# Patient Record
Sex: Female | Born: 1940
Health system: Southern US, Community
[De-identification: ages and names within clinical notes are randomized; demographics above are authoritative.]

## PROBLEM LIST (undated history)

## (undated) DIAGNOSIS — Z5189 Encounter for other specified aftercare: Secondary | ICD-10-CM

## (undated) DIAGNOSIS — F329 Major depressive disorder, single episode, unspecified: Secondary | ICD-10-CM

## (undated) DIAGNOSIS — E785 Hyperlipidemia, unspecified: Secondary | ICD-10-CM

## (undated) DIAGNOSIS — E538 Deficiency of other specified B group vitamins: Secondary | ICD-10-CM

## (undated) DIAGNOSIS — F32A Depression, unspecified: Secondary | ICD-10-CM

## (undated) DIAGNOSIS — F419 Anxiety disorder, unspecified: Secondary | ICD-10-CM

## (undated) DIAGNOSIS — E119 Type 2 diabetes mellitus without complications: Secondary | ICD-10-CM

## (undated) DIAGNOSIS — I10 Essential (primary) hypertension: Secondary | ICD-10-CM

## (undated) DIAGNOSIS — K644 Residual hemorrhoidal skin tags: Secondary | ICD-10-CM

## (undated) DIAGNOSIS — K2970 Gastritis, unspecified, without bleeding: Secondary | ICD-10-CM

## (undated) DIAGNOSIS — K317 Polyp of stomach and duodenum: Secondary | ICD-10-CM

## (undated) DIAGNOSIS — N179 Acute kidney failure, unspecified: Secondary | ICD-10-CM

## (undated) DIAGNOSIS — IMO0001 Reserved for inherently not codable concepts without codable children: Secondary | ICD-10-CM

## (undated) DIAGNOSIS — D510 Vitamin B12 deficiency anemia due to intrinsic factor deficiency: Secondary | ICD-10-CM

## (undated) DIAGNOSIS — R0602 Shortness of breath: Secondary | ICD-10-CM

## (undated) DIAGNOSIS — I4891 Unspecified atrial fibrillation: Secondary | ICD-10-CM

## (undated) DIAGNOSIS — K624 Stenosis of anus and rectum: Secondary | ICD-10-CM

## (undated) DIAGNOSIS — K3189 Other diseases of stomach and duodenum: Secondary | ICD-10-CM

## (undated) HISTORY — PX: ABDOMINAL HYSTERECTOMY: SHX81

## (undated) HISTORY — DX: Major depressive disorder, single episode, unspecified: F32.9

## (undated) HISTORY — DX: Vitamin B12 deficiency anemia due to intrinsic factor deficiency: D51.0

## (undated) HISTORY — DX: Anxiety disorder, unspecified: F41.9

## (undated) HISTORY — DX: Polyp of stomach and duodenum: K31.7

## (undated) HISTORY — DX: Stenosis of anus and rectum: K62.4

## (undated) HISTORY — PX: CARDIAC ELECTROPHYSIOLOGY STUDY AND ABLATION: SHX1294

## (undated) HISTORY — DX: Depression, unspecified: F32.A

## (undated) HISTORY — DX: Acute kidney failure, unspecified: N17.9

## (undated) HISTORY — DX: Gastritis, unspecified, without bleeding: K29.70

## (undated) HISTORY — DX: Residual hemorrhoidal skin tags: K64.4

## (undated) HISTORY — DX: Essential (primary) hypertension: I10

## (undated) HISTORY — DX: Other diseases of stomach and duodenum: K31.89

## (undated) HISTORY — DX: Deficiency of other specified B group vitamins: E53.8

---

## 1978-09-23 HISTORY — PX: BACK SURGERY: SHX140

## 2008-09-23 DIAGNOSIS — K624 Stenosis of anus and rectum: Secondary | ICD-10-CM

## 2008-09-23 HISTORY — DX: Stenosis of anus and rectum: K62.4

## 2011-05-15 ENCOUNTER — Other Ambulatory Visit: Payer: Self-pay | Admitting: Internal Medicine

## 2011-05-15 DIAGNOSIS — Z1231 Encounter for screening mammogram for malignant neoplasm of breast: Secondary | ICD-10-CM

## 2011-05-15 DIAGNOSIS — Z780 Asymptomatic menopausal state: Secondary | ICD-10-CM

## 2011-09-24 DIAGNOSIS — K31A0 Gastric intestinal metaplasia, unspecified: Secondary | ICD-10-CM

## 2011-09-24 HISTORY — DX: Gastric intestinal metaplasia, unspecified: K31.A0

## 2011-09-28 ENCOUNTER — Emergency Department (INDEPENDENT_AMBULATORY_CARE_PROVIDER_SITE_OTHER): Payer: Medicare Other

## 2011-09-28 ENCOUNTER — Encounter: Payer: Self-pay | Admitting: Emergency Medicine

## 2011-09-28 ENCOUNTER — Other Ambulatory Visit: Payer: Self-pay

## 2011-09-28 ENCOUNTER — Inpatient Hospital Stay (HOSPITAL_BASED_OUTPATIENT_CLINIC_OR_DEPARTMENT_OTHER)
Admission: EM | Admit: 2011-09-28 | Discharge: 2011-10-02 | DRG: 812 | Disposition: A | Discharge status: Home / Self Care | Payer: Medicare Other | Source: Ambulatory Visit | Attending: Family Medicine | Admitting: Family Medicine

## 2011-09-28 DIAGNOSIS — R42 Dizziness and giddiness: Secondary | ICD-10-CM | POA: Diagnosis present

## 2011-09-28 DIAGNOSIS — D649 Anemia, unspecified: Secondary | ICD-10-CM | POA: Diagnosis present

## 2011-09-28 DIAGNOSIS — N179 Acute kidney failure, unspecified: Secondary | ICD-10-CM | POA: Diagnosis present

## 2011-09-28 DIAGNOSIS — K922 Gastrointestinal hemorrhage, unspecified: Secondary | ICD-10-CM | POA: Diagnosis present

## 2011-09-28 DIAGNOSIS — K2940 Chronic atrophic gastritis without bleeding: Secondary | ICD-10-CM | POA: Diagnosis present

## 2011-09-28 DIAGNOSIS — R55 Syncope and collapse: Secondary | ICD-10-CM

## 2011-09-28 DIAGNOSIS — K317 Polyp of stomach and duodenum: Secondary | ICD-10-CM | POA: Diagnosis present

## 2011-09-28 DIAGNOSIS — R5381 Other malaise: Secondary | ICD-10-CM

## 2011-09-28 DIAGNOSIS — D519 Vitamin B12 deficiency anemia, unspecified: Secondary | ICD-10-CM

## 2011-09-28 DIAGNOSIS — D518 Other vitamin B12 deficiency anemias: Secondary | ICD-10-CM | POA: Diagnosis present

## 2011-09-28 DIAGNOSIS — E860 Dehydration: Secondary | ICD-10-CM | POA: Diagnosis present

## 2011-09-28 DIAGNOSIS — N390 Urinary tract infection, site not specified: Secondary | ICD-10-CM | POA: Diagnosis present

## 2011-09-28 DIAGNOSIS — D7589 Other specified diseases of blood and blood-forming organs: Secondary | ICD-10-CM | POA: Diagnosis present

## 2011-09-28 DIAGNOSIS — D131 Benign neoplasm of stomach: Secondary | ICD-10-CM | POA: Diagnosis present

## 2011-09-28 DIAGNOSIS — R634 Abnormal weight loss: Secondary | ICD-10-CM

## 2011-09-28 DIAGNOSIS — D510 Vitamin B12 deficiency anemia due to intrinsic factor deficiency: Principal | ICD-10-CM

## 2011-09-28 DIAGNOSIS — R5383 Other fatigue: Secondary | ICD-10-CM

## 2011-09-28 DIAGNOSIS — F341 Dysthymic disorder: Secondary | ICD-10-CM | POA: Diagnosis present

## 2011-09-28 DIAGNOSIS — R51 Headache: Secondary | ICD-10-CM

## 2011-09-28 DIAGNOSIS — Z23 Encounter for immunization: Secondary | ICD-10-CM

## 2011-09-28 HISTORY — DX: Encounter for other specified aftercare: Z51.89

## 2011-09-28 HISTORY — DX: Shortness of breath: R06.02

## 2011-09-28 HISTORY — DX: Reserved for inherently not codable concepts without codable children: IMO0001

## 2011-09-28 LAB — URINE MICROSCOPIC-ADD ON

## 2011-09-28 LAB — COMPREHENSIVE METABOLIC PANEL
ALT: 16 U/L (ref 0–35)
AST: 17 U/L (ref 0–37)
Albumin: 4 g/dL (ref 3.5–5.2)
Alkaline Phosphatase: 66 U/L (ref 39–117)
BUN: 31 mg/dL — ABNORMAL HIGH (ref 6–23)
CO2: 24 mEq/L (ref 19–32)
Calcium: 9.2 mg/dL (ref 8.4–10.5)
Chloride: 106 mEq/L (ref 96–112)
Creatinine, Ser: 1.5 mg/dL — ABNORMAL HIGH (ref 0.50–1.10)
GFR calc Af Amer: 39 mL/min — ABNORMAL LOW (ref >90)
GFR calc non Af Amer: 34 mL/min — ABNORMAL LOW (ref >90)
Glucose, Bld: 114 mg/dL — ABNORMAL HIGH (ref 70–99)
Potassium: 3.8 mEq/L (ref 3.5–5.1)
Sodium: 140 mEq/L (ref 135–145)
Total Bilirubin: 1.1 mg/dL (ref 0.3–1.2)
Total Protein: 7 g/dL (ref 6.0–8.3)

## 2011-09-28 LAB — CBC
HCT: 18.7 % — ABNORMAL LOW (ref 36.0–46.0)
HCT: 17.4 % — ABNORMAL LOW (ref 36.0–46.0)
Hemoglobin: 6.5 g/dL — CL (ref 12.0–15.0)
Hemoglobin: 6.3 g/dL — CL (ref 12.0–15.0)
MCH: 39.9 pg — ABNORMAL HIGH (ref 26.0–34.0)
MCH: 41.2 pg — ABNORMAL HIGH (ref 26.0–34.0)
MCHC: 34.8 g/dL (ref 30.0–36.0)
MCHC: 36.2 g/dL — ABNORMAL HIGH (ref 30.0–36.0)
MCV: 114.7 fL — ABNORMAL HIGH (ref 78.0–100.0)
MCV: 113.7 fL — ABNORMAL HIGH (ref 78.0–100.0)
Platelets: 159 10*3/uL (ref 150–400)
Platelets: 154 10*3/uL (ref 150–400)
RBC: 1.63 MIL/uL — ABNORMAL LOW (ref 3.87–5.11)
RBC: 1.53 MIL/uL — ABNORMAL LOW (ref 3.87–5.11)
RDW: 16.9 % — ABNORMAL HIGH (ref 11.5–15.5)
RDW: 18.5 % — ABNORMAL HIGH (ref 11.5–15.5)
WBC: 4.6 10*3/uL (ref 4.0–10.5)
WBC: 4.8 10*3/uL (ref 4.0–10.5)

## 2011-09-28 LAB — URINALYSIS, ROUTINE W REFLEX MICROSCOPIC
Bilirubin Urine: NEGATIVE
Glucose, UA: NEGATIVE mg/dL
Ketones, ur: NEGATIVE mg/dL
Nitrite: POSITIVE — AB
Protein, ur: NEGATIVE mg/dL
Specific Gravity, Urine: 1.018 (ref 1.005–1.030)
Urobilinogen, UA: 1 mg/dL (ref 0.0–1.0)
pH: 5.5 (ref 5.0–8.0)

## 2011-09-28 LAB — APTT: aPTT: 32 seconds (ref 24–37)

## 2011-09-28 LAB — RETICULOCYTES
RBC.: 1.53 MIL/uL — ABNORMAL LOW (ref 3.87–5.11)
Retic Count, Absolute: 30.6 10*3/uL (ref 19.0–186.0)
Retic Ct Pct: 2 % (ref 0.4–3.1)

## 2011-09-28 LAB — PROTIME-INR
INR: 1.05 (ref 0.00–1.49)
Prothrombin Time: 13.9 seconds (ref 11.6–15.2)

## 2011-09-28 LAB — CARDIAC PANEL(CRET KIN+CKTOT+MB+TROPI)
CK, MB: 1.2 ng/mL (ref 0.3–4.0)
Relative Index: INVALID (ref 0.0–2.5)
Total CK: 19 U/L (ref 7–177)
Troponin I: <0.3 ng/mL (ref <0.30)

## 2011-09-28 LAB — OCCULT BLOOD X 1 CARD TO LAB, STOOL: Fecal Occult Bld: NEGATIVE

## 2011-09-28 MED ORDER — SODIUM CHLORIDE 0.45 % IV SOLN: INTRAVENOUS | Status: DC

## 2011-09-28 MED ORDER — PANTOPRAZOLE SODIUM 40 MG IV SOLR
40.0000 mg | Freq: Once | INTRAVENOUS | Status: AC
  Administered 2011-09-28: 40 mg via INTRAVENOUS
  Filled 2011-09-28: qty 40

## 2011-09-28 MED ORDER — SODIUM CHLORIDE 0.9 % IV SOLN
Freq: Once | INTRAVENOUS | Status: AC
  Administered 2011-09-30: 01:00:00 via INTRAVENOUS

## 2011-09-28 MED ORDER — SODIUM CHLORIDE 0.9 % IV SOLN: INTRAVENOUS | Status: AC

## 2011-09-28 MED ORDER — SODIUM CHLORIDE 0.9 % IV BOLUS (SEPSIS)
1000.0000 mL | Freq: Once | INTRAVENOUS | Status: AC
  Administered 2011-09-28: 1000 mL via INTRAVENOUS

## 2011-09-28 MED ORDER — SODIUM CHLORIDE 0.9 % IV SOLN
Freq: Once | INTRAVENOUS | Status: AC
  Administered 2011-09-28: 16:00:00 via INTRAVENOUS

## 2011-09-28 NOTE — ED Notes (Signed)
Room assignment 4731-2 given to pt and family

## 2011-09-28 NOTE — ED Notes (Signed)
Discussed with EDP Tucich- pt NOT to be transfused at this time

## 2011-09-28 NOTE — ED Notes (Signed)
EDP Tucich notified of hbg 6.5

## 2011-09-28 NOTE — H&P (Signed)
PCP:  Currently none  Chief Complaint:  General fatigue and lighheaded  HPI: Kathryn Thomas is a 71 y.o. female   has no past medical history on file.   Presented with  Feeling generally weak, lightheaded when ever shestands up. Reports poor appetite for past few months.  Loss of weight for past 3 months unsure how much. NO blood in stool, no melena, denies nausea or vomiting. No epigastric pain. Takes motrin on occasion (twice a week). Recently have felt so poorly she has hardly gotten out of the bed. Over past few months noticed her hair getting thinner.   She have had negative colonoscopy 2 years ago and have had regular mammograms.   Review of Systems:    Pertinent positives include: Knee pain, weight loss, fatigue last month had some leg swelling but not anymore, loss of appetite   Constitutional:  No  night sweats, Fevers, chills, HEENT:  No headaches, Difficulty swallowing,Tooth/dental problems,Sore throat,  No sneezing, itching, ear ache, nasal congestion, post nasal drip,  Cardio-vascular:  No chest pain, Orthopnea, PND, anasarca, dizziness, palpitations.no Bilateral lower extremity swelling  GI:  No heartburn, indigestion, abdominal pain, nausea, vomiting, diarrhea, change in bowel habits Resp:  no shortness of breath at rest. No dyspnea on exertion, No excess mucus, no productive cough, No non-productive cough, No coughing up of blood.No change in color of mucus.No wheezing.No chest wall deformity  Skin:  no rash or lesions.  GU:  no dysuria, change in color of urine, no urgency or frequency. No flank pain.  Musculoskeletal:  No joint pain or swelling. No decreased range of motion. No back pain.  Psych:  No change in mood or affect. No depression or anxiety. No memory loss.   Otherwise ROS are negative except for above, 10 systems were reviewed  Past Medical History: History reviewed. No pertinent past medical history. Past Surgical History  Procedure Date  .  Back surgery   . Abdominal hysterectomy      Medications: Prior to Admission medications   Not on File    Allergies:  No Known Allergies  Social History:  Ambulatory independently Lives at home alone   reports that she has never smoked. She has never used smokeless tobacco. She reports that she does not drink alcohol or use illicit drugs.   Family History: family history includes Cancer in her mother; Heart disease in her father; and Multiple myeloma in her sister.    Physical Exam: Patient Vitals for the past 24 hrs:  BP Temp Temp src Pulse Resp SpO2 Height Weight  09/28/11 2017 107/41 mmHg 99.1 F (37.3 C) Oral 58  18  99 % 5\' 7"  (1.702 m) 95.23 kg (209 lb 15.1 oz)  09/28/11 1923 109/54 mmHg - - 70  18  100 % - -  09/28/11 1800 98/48 mmHg 97.3 F (36.3 C) Oral - 18  100 % - -  09/28/11 1736 98/42 mmHg - - - 16  100 % - -  09/28/11 1548 93/58 mmHg - - - 18  98 % - -  09/28/11 1356 91/47 mmHg 97.3 F (36.3 C) Oral - 16  100 % - -  09/28/11 1327 94/52 mmHg - - - - 95 % - -  09/28/11 1304 112/57 mmHg 98.4 F (36.9 C) Oral 73  20  100 % 5\' 7"  (1.702 m) 95.255 kg (210 lb)    1. General:  in No Acute distress 2. Psychological: Alert and Oriented 3. Head/ENT:  Dry Mucous Membranes                          Head Non traumatic, neck supple                          Normal  Dentition 4. SKIN:  decreased Skin turgor,  Skin clean Dry and intact no rash, loose skin consistent with rapid weight loss. 5. Heart: Regular rate and rhythm no Murmur, Rub or gallop 6. Lungs: Clear to auscultation bilaterally, no wheezes or crackles   7. Abdomen: Soft, non-tender, Non distended 8. Lower extremities: no clubbing, cyanosis, or edema 9. Neurologically Grossly intact, moving all 4 extremities equally 10. MSK: Normal range of motion  body mass index is 32.88 kg/(m^2).   Labs on Admission:   Basename 09/28/11 1400  NA 140  K 3.8  CL 106  CO2 24  GLUCOSE 114*  BUN 31*    CREATININE 1.50*  CALCIUM 9.2  MG --  PHOS --    Basename 09/28/11 1400  AST 17  ALT 16  ALKPHOS 66  BILITOT 1.1  PROT 7.0  ALBUMIN 4.0   No results found for this basename: LIPASE:2,AMYLASE:2 in the last 72 hours  Basename 09/28/11 2209 09/28/11 1400  WBC 4.8 4.6  NEUTROABS -- --  HGB 6.3* 6.5*  HCT 17.4* 18.7*  MCV 113.7* 114.7*  PLT 154 159    Basename 09/28/11 1400  CKTOTAL 19  CKMB 1.2  CKMBINDEX --  TROPONINI <0.30   No results found for this basename: TSH,T4TOTAL,FREET3,T3FREE,THYROIDAB in the last 72 hours No results found for this basename: VITAMINB12:2,FOLATE:2,FERRITIN:2,TIBC:2,IRON:2,RETICCTPCT:2 in the last 72 hours No results found for this basename: HGBA1C    Estimated Creatinine Clearance: 40.7 ml/min (by C-G formula based on Cr of 1.5).   Other results:  I have pearsonaly reviewed this: ECG REPORT  Rate:71  Rhythm: NSR ST&T Change: NO ischemic changes  UA WBC 21-50 many bacteria Fecal Occult Bld NEGATIVE  INR 1.05   Cultures: No results found for this basename: sdes, specrequest, cult, reptstatus       Radiological Exams on Admission: Dg Chest 2 View  09/28/2011  *RADIOLOGY REPORT*  Clinical Data: Weakness, dizziness  CHEST - 2 VIEW  Comparison: None.  Findings: Lungs are clear. No pleural effusion or pneumothorax.  The heart is top normal in size.  Degenerative changes of the visualized thoracolumbar spine.  IMPRESSION: No evidence of acute cardiopulmonary disease.  Original Report Authenticated By: Charline Bills, M.D.   Ct Head Wo Contrast  09/28/2011  *RADIOLOGY REPORT*  Clinical Data: Headaches, weakness, dizziness  CT HEAD WITHOUT CONTRAST  Technique:  Contiguous axial images were obtained from the base of the skull through the vertex without contrast.  Comparison: None.  Findings: No evidence of parenchymal hemorrhage or extra-axial fluid collection. No mass lesion, mass effect, or midline shift.  No CT evidence of acute  infarction.  Mild subcortical white matter and periventricular small vessel ischemic changes.  Intracranial atherosclerosis.  Cerebral volume is age appropriate.  No ventriculomegaly.  The visualized paranasal sinuses are essentially clear. The mastoid air cells are unopacified.  No evidence of calvarial fracture.  IMPRESSION: No evidence of acute intracranial abnormality.  Mild small vessel ischemic changes with intracranial atherosclerosis.  Original Report Authenticated By: Charline Bills, M.D.    Assessment/Plan  Present on Admission:  .Anemia - etiology unclear, low retic count is worrisome. Will transfuse 2  units. Prior to transfusion will obtain anemia panel, hemoccult stool, if iron deficiency noted would benefit from Gi work up. Will hold off on ibuprofen and write for protonix po for now. Also check TSH, CBC w diff and blood smear. May need hematology consult if no clear blood loss..Weight loss - Worrisome in association with anemia. She would benefit from cancer work up. CXR is negative. Consider CT of the abdomen if work up is otherwise inconclusive.  Marland KitchenPositional lightheadedness - Patient appears dehydrated with elevated Cr. Will give IVF and check orthostatics.  Monitor on Telemetry, CE x1 wnl, ECG no evidence of ischemia she has been symptomatic for weeks .Acute renal failure - - likely secondary to dehydration, check FeNA and if not improved with IVF would obtain renal US  .UTI (lower urinary tract infection) - obtain urine culture and start on rocephin   Prophylaxis: SCD , Protonix  CODE STATUS: FULL CODE    Geanette Buonocore 09/28/2011, 11:06 PM

## 2011-09-28 NOTE — ED Provider Notes (Signed)
History     CSN: [id number]  Arrival date & time 09/28/11  1152   First MD Initiated Contact with Patient 09/28/11 1316      Chief Complaint  Patient presents with  . Dizziness  . Anorexia   Poor appetite, and dizziness for 6 months. Worsening over the past 2 months. Also, complains of nausea, and weakness, and some vertiginous symptoms.  In by HER-2 daughters, who states that mom, essentially has not gotten out of bed of the past week. She is normally active. She's had some intermittent headaches, and nausea. She denies any melena or abdominal pain, but states that every time. She gets up she feels very dizzy and lightheaded. She's had no chest pain. No fevers no vomiting. Denies any new medications or recent travel. Apparently, does have a sibling with some type of bone marrow cancer.  The patient did see her primary doctor approximately 2-3 months ago and have basic lab work done, which was reported to her as being normal.  Patient does not smoke or drink. Takes ibuprofen on occasion. No aspirin or Plavix or any blood thinners. Patient was unaware of any dark or tarry stools. (Consider location/radiation/quality/duration/timing/severity/associated sxs/prior treatment) HPI  History reviewed. No pertinent past medical history.  Past Surgical History  Procedure Date  . Back surgery   . Abdominal hysterectomy     History reviewed. No pertinent family history.  History  Substance Use Topics  . Smoking status: Never Smoker   . Smokeless tobacco: Never Used  . Alcohol Use: No    OB History    Grav Para Term Preterm Abortions TAB SAB Ect Mult Living                  Review of Systems  All other systems reviewed and are negative.    Allergies  Review of patient's allergies indicates no known allergies.  Home Medications  No current outpatient prescriptions on file.  BP 91/47  Pulse 73  Temp(Src) 97.3 F (36.3 C) (Oral)  Resp 16  Ht 5\' 7"  (1.702 m)  Wt 210 lb (95.255  kg)  BMI 32.89 kg/m2  SpO2 100%  Physical Exam  Nursing note and vitals reviewed. Constitutional: She is oriented to person, place, and time. She appears well-developed and well-nourished.       No distress, somewhat listless, no lethargy.  HENT:  Head: Normocephalic and atraumatic.  Eyes: Conjunctivae and EOM are normal. Pupils are equal, round, and reactive to light.  Neck: Neck supple.  Cardiovascular: Normal rate and regular rhythm.  Exam reveals no gallop and no friction rub.   No murmur heard. Pulmonary/Chest: Breath sounds normal. She has no wheezes. She has no rales. She exhibits no tenderness.  Abdominal: Soft. Bowel sounds are normal. She exhibits no distension. There is no tenderness. There is no rebound and no guarding.  Genitourinary:       Rectal done with female nursing supervision, nl tone, no gross blood, dark, not tarry stool  Musculoskeletal: Normal range of motion. She exhibits no edema and no tenderness.  Neurological: She is alert and oriented to person, place, and time. She has normal reflexes. No cranial nerve deficit. She exhibits normal muscle tone. Coordination normal.  Skin: Skin is warm and dry. No rash noted.  Psychiatric: She has a normal mood and affect.    ED Course  Procedures (including critical care time)  Labs Reviewed  CBC - Abnormal; Notable for the following:    RBC 1.63 (*)  Hemoglobin 6.5 (*)    HCT 18.7 (*)    MCV 114.7 (*)    MCH 39.9 (*)    RDW 16.9 (*)    All other components within normal limits  COMPREHENSIVE METABOLIC PANEL - Abnormal; Notable for the following:    Glucose, Bld 114 (*)    BUN 31 (*)    Creatinine, Ser 1.50 (*)    GFR calc non Af Amer 34 (*)    GFR calc Af Amer 39 (*)    All other components within normal limits  CARDIAC PANEL(CRET KIN+CKTOT+MB+TROPI)  APTT  PROTIME-INR  URINALYSIS, ROUTINE W REFLEX MICROSCOPIC   Dg Chest 2 View  09/28/2011  *RADIOLOGY REPORT*  Clinical Data: Weakness, dizziness  CHEST  - 2 VIEW  Comparison: None.  Findings: Lungs are clear. No pleural effusion or pneumothorax.  The heart is top normal in size.  Degenerative changes of the visualized thoracolumbar spine.  IMPRESSION: No evidence of acute cardiopulmonary disease.  Original Report Authenticated By: Charline Bills, M.D.   Ct Head Wo Contrast  09/28/2011  *RADIOLOGY REPORT*  Clinical Data: Headaches, weakness, dizziness  CT HEAD WITHOUT CONTRAST  Technique:  Contiguous axial images were obtained from the base of the skull through the vertex without contrast.  Comparison: None.  Findings: No evidence of parenchymal hemorrhage or extra-axial fluid collection. No mass lesion, mass effect, or midline shift.  No CT evidence of acute infarction.  Mild subcortical white matter and periventricular small vessel ischemic changes.  Intracranial atherosclerosis.  Cerebral volume is age appropriate.  No ventriculomegaly.  The visualized paranasal sinuses are essentially clear. The mastoid air cells are unopacified.  No evidence of calvarial fracture.  IMPRESSION: No evidence of acute intracranial abnormality.  Mild small vessel ischemic changes with intracranial atherosclerosis.  Original Report Authenticated By: Charline Bills, M.D.     1. Anemia   2. Near syncope       MDM  Pt is seen and examined;  Initial history and physical completed.  Will follow.      2:59 PM  Results for orders placed during the hospital encounter of 09/28/11  CBC      Component Value Range   WBC 4.6  4.0 - 10.5 (K/uL)   RBC 1.63 (*) 3.87 - 5.11 (MIL/uL)   Hemoglobin 6.5 (*) 12.0 - 15.0 (g/dL)   HCT 40.9 (*) 81.1 - 46.0 (%)   MCV 114.7 (*) 78.0 - 100.0 (fL)   MCH 39.9 (*) 26.0 - 34.0 (pg)   MCHC 34.8  30.0 - 36.0 (g/dL)   RDW 91.4 (*) 78.2 - 15.5 (%)   Platelets 159  150 - 400 (K/uL)  CARDIAC PANEL(CRET KIN+CKTOT+MB+TROPI)      Component Value Range   Total CK 19  7 - 177 (U/L)   CK, MB 1.2  0.3 - 4.0 (ng/mL)   Troponin I <0.30   <0.30 (ng/mL)   Relative Index RELATIVE INDEX IS INVALID  0.0 - 2.5   COMPREHENSIVE METABOLIC PANEL      Component Value Range   Sodium 140  135 - 145 (mEq/L)   Potassium 3.8  3.5 - 5.1 (mEq/L)   Chloride 106  96 - 112 (mEq/L)   CO2 24  19 - 32 (mEq/L)   Glucose, Bld 114 (*) 70 - 99 (mg/dL)   BUN 31 (*) 6 - 23 (mg/dL)   Creatinine, Ser 9.56 (*) 0.50 - 1.10 (mg/dL)   Calcium 9.2  8.4 - 21.3 (mg/dL)   Total Protein 7.0  6.0 - 8.3 (g/dL)   Albumin 4.0  3.5 - 5.2 (g/dL)   AST 17  0 - 37 (U/L)   ALT 16  0 - 35 (U/L)   Alkaline Phosphatase 66  39 - 117 (U/L)   Total Bilirubin 1.1  0.3 - 1.2 (mg/dL)   GFR calc non Af Amer 34 (*) >90 (mL/min)   GFR calc Af Amer 39 (*) >90 (mL/min)  APTT      Component Value Range   aPTT 32  24 - 37 (seconds)  PROTIME-INR      Component Value Range   Prothrombin Time 13.9  11.6 - 15.2 (seconds)   INR 1.05  0.00 - 1.49    No results found.   Date: 09/28/2011  Rate: 71  Rhythm: normal sinus rhythm  QRS Axis: normal  Intervals: normal  ST/T Wave abnormalities: nonspecific T wave changes  Conduction Disutrbances:none  Narrative Interpretation:   Old EKG Reviewed: none available        Larry Knipp A. Patrica Duel, MD 09/29/11 0700

## 2011-09-28 NOTE — ED Notes (Signed)
Phone report given to [facility]- advise they are 45 mins out

## 2011-09-28 NOTE — ED Notes (Signed)
Pt states she poor appetite and dizziness for 6 months but worsening over last two months.   Some diaphoresis, nausea, weakness and room spinning with ambulation.  Symptoms resolve with lying down.  No chest pain or SOB.  No V/D.

## 2011-09-29 DIAGNOSIS — D649 Anemia, unspecified: Secondary | ICD-10-CM

## 2011-09-29 LAB — DIFFERENTIAL
Basophils Absolute: 0 10*3/uL (ref 0.0–0.1)
Basophils Absolute: 0 10*3/uL (ref 0.0–0.1)
Basophils Relative: 0 % (ref 0–1)
Basophils Relative: 0 % (ref 0–1)
Eosinophils Absolute: 0.1 10*3/uL (ref 0.0–0.7)
Eosinophils Absolute: 0.2 10*3/uL (ref 0.0–0.7)
Eosinophils Relative: 3 % (ref 0–5)
Eosinophils Relative: 4 % (ref 0–5)
Lymphocytes Relative: 36 % (ref 12–46)
Lymphocytes Relative: 45 % (ref 12–46)
Lymphs Abs: 1.7 10*3/uL (ref 0.7–4.0)
Lymphs Abs: 2.3 10*3/uL (ref 0.7–4.0)
Monocytes Absolute: 0.1 10*3/uL (ref 0.1–1.0)
Monocytes Absolute: 0.1 10*3/uL (ref 0.1–1.0)
Monocytes Relative: 2 % — ABNORMAL LOW (ref 3–12)
Monocytes Relative: 3 % (ref 3–12)
Neutro Abs: 2.9 10*3/uL (ref 1.7–7.7)
Neutro Abs: 2.5 10*3/uL (ref 1.7–7.7)
Neutrophils Relative %: 59 % (ref 43–77)
Neutrophils Relative %: 49 % (ref 43–77)

## 2011-09-29 LAB — COMPREHENSIVE METABOLIC PANEL
ALT: 13 U/L (ref 0–35)
AST: 14 U/L (ref 0–37)
Albumin: 3.1 g/dL — ABNORMAL LOW (ref 3.5–5.2)
Alkaline Phosphatase: 55 U/L (ref 39–117)
BUN: 27 mg/dL — ABNORMAL HIGH (ref 6–23)
CO2: 23 mEq/L (ref 19–32)
Calcium: 8.8 mg/dL (ref 8.4–10.5)
Chloride: 111 mEq/L (ref 96–112)
Creatinine, Ser: 1.3 mg/dL — ABNORMAL HIGH (ref 0.50–1.10)
GFR calc Af Amer: 47 mL/min — ABNORMAL LOW (ref >90)
GFR calc non Af Amer: 40 mL/min — ABNORMAL LOW (ref >90)
Glucose, Bld: 98 mg/dL (ref 70–99)
Potassium: 3.9 mEq/L (ref 3.5–5.1)
Sodium: 142 mEq/L (ref 135–145)
Total Bilirubin: 1.4 mg/dL — ABNORMAL HIGH (ref 0.3–1.2)
Total Protein: 6.2 g/dL (ref 6.0–8.3)

## 2011-09-29 LAB — FERRITIN: Ferritin: 279 ng/mL (ref 10–291)

## 2011-09-29 LAB — IRON AND TIBC
Iron: 49 ug/dL (ref 42–135)
Saturation Ratios: 21 % (ref 20–55)
TIBC: 229 ug/dL — ABNORMAL LOW (ref 250–470)
UIBC: 180 ug/dL (ref 125–400)

## 2011-09-29 LAB — SAVE SMEAR

## 2011-09-29 LAB — CBC
HCT: 22.2 % — ABNORMAL LOW (ref 36.0–46.0)
Hemoglobin: 7.9 g/dL — ABNORMAL LOW (ref 12.0–15.0)
MCH: 36.6 pg — ABNORMAL HIGH (ref 26.0–34.0)
MCHC: 35.6 g/dL (ref 30.0–36.0)
MCV: 102.8 fL — ABNORMAL HIGH (ref 78.0–100.0)
Platelets: 136 10*3/uL — ABNORMAL LOW (ref 150–400)
RBC: 2.16 MIL/uL — ABNORMAL LOW (ref 3.87–5.11)
RDW: 24.9 % — ABNORMAL HIGH (ref 11.5–15.5)
WBC: 5.2 10*3/uL (ref 4.0–10.5)

## 2011-09-29 LAB — VITAMIN B12: Vitamin B-12: 59 pg/mL — ABNORMAL LOW (ref 211–911)

## 2011-09-29 LAB — PHOSPHORUS: Phosphorus: 4 mg/dL (ref 2.3–4.6)

## 2011-09-29 LAB — MAGNESIUM: Magnesium: 2.1 mg/dL (ref 1.5–2.5)

## 2011-09-29 LAB — ABO/RH: ABO/RH(D): A POS

## 2011-09-29 LAB — FOLATE: Folate: >20 ng/mL

## 2011-09-29 LAB — TSH: TSH: 1.708 u[IU]/mL (ref 0.350–4.500)

## 2011-09-29 LAB — PREPARE RBC (CROSSMATCH)

## 2011-09-29 MED ORDER — SODIUM CHLORIDE 0.9 % IV SOLN
INTRAVENOUS | Status: AC
  Administered 2011-09-29: 09:00:00 via INTRAVENOUS

## 2011-09-29 MED ORDER — DEXTROSE 5 % IV SOLN
1.0000 g | INTRAVENOUS | Status: DC
  Administered 2011-09-29 – 2011-09-30 (×2): 1 g via INTRAVENOUS
  Filled 2011-09-29 (×2): qty 10

## 2011-09-29 MED ORDER — ALUM & MAG HYDROXIDE-SIMETH 200-200-20 MG/5ML PO SUSP: 30.0000 mL | Freq: Four times a day (QID) | ORAL | Status: DC | PRN

## 2011-09-29 MED ORDER — SODIUM CHLORIDE 0.9 % IV SOLN
500.0000 mL | Freq: Once | INTRAVENOUS | Status: AC
  Administered 2011-09-29: 250 mL via INTRAVENOUS

## 2011-09-29 MED ORDER — CYANOCOBALAMIN 1000 MCG/ML IJ SOLN
100.0000 ug | Freq: Once | INTRAMUSCULAR | Status: AC
  Administered 2011-09-29: 100 ug via INTRAMUSCULAR
  Filled 2011-09-29: qty 0.1

## 2011-09-29 MED ORDER — ACETAMINOPHEN 650 MG RE SUPP: 650.0000 mg | Freq: Four times a day (QID) | RECTAL | Status: DC | PRN

## 2011-09-29 MED ORDER — ZOLPIDEM TARTRATE 5 MG PO TABS: 5.0000 mg | ORAL_TABLET | Freq: Every evening | ORAL | Status: DC | PRN

## 2011-09-29 MED ORDER — ACETAMINOPHEN 325 MG PO TABS
650.0000 mg | ORAL_TABLET | Freq: Once | ORAL | Status: AC
  Administered 2011-09-29: 650 mg via ORAL
  Filled 2011-09-29: qty 2

## 2011-09-29 MED ORDER — DOCUSATE SODIUM 100 MG PO CAPS
100.0000 mg | ORAL_CAPSULE | Freq: Two times a day (BID) | ORAL | Status: DC
  Administered 2011-09-29 – 2011-10-01 (×5): 100 mg via ORAL
  Filled 2011-09-29 (×7): qty 1

## 2011-09-29 MED ORDER — PANTOPRAZOLE SODIUM 40 MG PO TBEC
40.0000 mg | DELAYED_RELEASE_TABLET | Freq: Every day | ORAL | Status: DC
  Administered 2011-09-29 – 2011-09-30 (×2): 40 mg via ORAL
  Filled 2011-09-29 (×2): qty 1

## 2011-09-29 MED ORDER — DIPHENHYDRAMINE HCL 25 MG PO CAPS
25.0000 mg | ORAL_CAPSULE | Freq: Once | ORAL | Status: AC
  Administered 2011-09-29: 25 mg via ORAL
  Filled 2011-09-29: qty 1

## 2011-09-29 MED ORDER — ALBUTEROL SULFATE (5 MG/ML) 0.5% IN NEBU: 2.5000 mg | INHALATION_SOLUTION | RESPIRATORY_TRACT | Status: DC | PRN

## 2011-09-29 MED ORDER — CYANOCOBALAMIN 1000 MCG/ML IJ SOLN
1000.0000 ug | Freq: Once | INTRAMUSCULAR | Status: AC
  Administered 2011-09-30: 1000 ug via INTRAMUSCULAR
  Filled 2011-09-29 (×2): qty 1

## 2011-09-29 MED ORDER — ONDANSETRON HCL 4 MG PO TABS: 4.0000 mg | ORAL_TABLET | Freq: Four times a day (QID) | ORAL | Status: DC | PRN

## 2011-09-29 MED ORDER — POTASSIUM CHLORIDE CRYS ER 20 MEQ PO TBCR
20.0000 meq | EXTENDED_RELEASE_TABLET | Freq: Two times a day (BID) | ORAL | Status: DC
  Administered 2011-09-29 – 2011-10-01 (×4): 20 meq via ORAL
  Filled 2011-09-29 (×6): qty 1

## 2011-09-29 MED ORDER — ACETAMINOPHEN 325 MG PO TABS: 650.0000 mg | ORAL_TABLET | Freq: Four times a day (QID) | ORAL | Status: DC | PRN

## 2011-09-29 MED ORDER — GUAIFENESIN-DM 100-10 MG/5ML PO SYRP: 5.0000 mL | ORAL_SOLUTION | ORAL | Status: DC | PRN

## 2011-09-29 MED ORDER — ONDANSETRON HCL 4 MG/2ML IJ SOLN: 4.0000 mg | Freq: Four times a day (QID) | INTRAMUSCULAR | Status: DC | PRN

## 2011-09-29 NOTE — Progress Notes (Signed)
Kathryn Thomas   DOB:1941-04-12   WU#:[id number]   [phone]  Subjective: 71 year old [place] woman presenting with a two-month history of weakness (nonfocal), dizzyness (no falls), altered taste, 11 lb weight loss, and hair loss; with no primary MD. Seen in the ER yesterday, where she was found to have a Hb of 6.3, MCV 114, other labs as below. Admitted and transfused. We are consulted re. Further evaluation and management.  PMHx: she tells me she used to get B12 shots, but changed Mds and that was stopped, years ago. Otherwise no significant medical history except for remote laminectomy x3, and s/p hysterectomy  FHx: mother died from cancer but she does not know the type; father died from MI. She had 4 sisters, 2 brothers. 3 sisters died from cancer ("of the bone marrow, sleep and cervix"). One brother died from stomach cancer; the other from a "very rare" type of cancer--she does not recall the name.  GYN Hx: GX P4; s/p hysterectomy as noted  Social Hx: daughter Steward Drone in room; has 4 children, 4 grandchildren; lives alone; is a Control and instrumentation engineer  ROS: denies headaches, nausea or vomiting, history of stroke or seizures; denies numbness or tingling; no cough, phlegm, pleurisy; no change in bowel habits recently--she had a colonoscopy 3 years ago because a she put it "my B-hole closed up and I couldn't poopy." The colonoscopy (performed in [place]--she does not recall the MD's name) was entirely normal--she was told to return in 10 years; they did some anal dilatation which took care of the stenosis; rest of a detailed ROS noncontributory  Objective:  Filed Vitals:   09/29/11 0800  BP: 102/64  Pulse: 65  Temp: 98.7 F (37.1 C)  Resp: 18    Body mass index is 31.17 kg/(m^2).   Sclerae unicteric  Oropharynx clear  No peripheral adenopathy  Lungs clear -- no rales or rhonchi  Heart regular rate and rhythm  Abdomen benign  MSK mild kyphosis, scoliosis, but no focal spinal  tenderness,  Neuro nonfocal    Labs:  Lab Results  Component Value Date   WBC 5.2 09/29/2011   HGB 7.9* 09/29/2011   HCT 22.2* 09/29/2011   MCV 102.8* 09/29/2011   PLT 136* 09/29/2011   NEUTROABS 2.5 09/29/2011   CBC  Lab 09/29/11 0842 09/28/11 2209 09/28/11 1400  WBC 5.2 4.8 4.6  HGB 7.9* 6.3* 6.5*  HCT 22.2* 17.4* 18.7*  PLT 136* 154 159  MCV 102.8* 113.7* 114.7*  MCH 36.6* 41.2* 39.9*  MCHC 35.6 36.2* 34.8  RDW 24.9* 18.5* 16.9*  LYMPHSABS 2.3 1.7 --  MONOABS 0.1 0.1 --  EOSABS 0.2 0.1 --  BASOSABS 0.0 0.0 --  BANDABS -- -- --    Chemistries   Lab 09/29/11 0842 09/28/11 1400  NA 142 140  K 3.9 3.8  CL 111 106  CO2 23 24  GLUCOSE 98 114*  BUN 27* 31*  CREATININE 1.30* 1.50*  CALCIUM 8.8 9.2  MG 2.1 --    GFR Estimated Creatinine Clearance: 45.8 ml/min (by C-G formula based on Cr of 1.3).  Coagulation profile  Lab 09/28/11 1400  INR 1.05  PROTIME --    Cardiac Enzymes  Lab 09/28/11 1400  CKMB 1.2  TROPONINI <0.30  MYOGLOBIN --    No components found with this basename: POCBNP:3 No results found for this basename: DDIMER:2 in the last 72 hours No results found for this basename: HGBA1C:2 in the last 72 hours No results found for this basename:  CHOL:2,HDL:2,LDLCALC:2,TRIG:2,CHOLHDL:2,LDLDIRECT:2 in the last 72 hours  Basename 09/28/11 2209  TSH 1.708  T4TOTAL --  T3FREE --  THYROIDAB --    Basename 09/28/11 2209  VITAMINB12 59*  FOLATE >20.0  FERRITIN 279  TIBC 229*  IRON 49  RETICCTPCT 2.0   No results found for this basename: LIPASE:2,AMYLASE:2 in the last 72 hours  Urine Studies No results found for this basename: UACOL:2,UAPR:2,USPG:2,UPH:2,UTP:2,UGL:2,UKET:2,UBIL:2,UHGB:2,UNIT:2,UROB:2,ULEU:2,UEPI:2,UWBC:2,URBC:2,UBAC:2,CAST:2,CRYS:2,UCOM:2,BILUA:2 in the last 72 hours  MICROBIOLOGY: No results found for this or any previous visit (from the past 240 hour(s)).   Studies:  Dg Chest 2 View  09/28/2011  *RADIOLOGY REPORT*  Clinical  Data: Weakness, dizziness  CHEST - 2 VIEW  Comparison: None.  Findings: Lungs are clear. No pleural effusion or pneumothorax.  The heart is top normal in size.  Degenerative changes of the visualized thoracolumbar spine.  IMPRESSION: No evidence of acute cardiopulmonary disease.  Original Report Authenticated By: Charline Bills, M.D.   Ct Head Wo Contrast  09/28/2011  *RADIOLOGY REPORT*  Clinical Data: Headaches, weakness, dizziness  CT HEAD WITHOUT CONTRAST  Technique:  Contiguous axial images were obtained from the base of the skull through the vertex without contrast.  Comparison: None.  Findings: No evidence of parenchymal hemorrhage or extra-axial fluid collection. No mass lesion, mass effect, or midline shift.  No CT evidence of acute infarction.  Mild subcortical white matter and periventricular small vessel ischemic changes.  Intracranial atherosclerosis.  Cerebral volume is age appropriate.  No ventriculomegaly.  The visualized paranasal sinuses are essentially clear. The mastoid air cells are unopacified.  No evidence of calvarial fracture.  IMPRESSION: No evidence of acute intracranial abnormality.  Mild small vessel ischemic changes with intracranial atherosclerosis.  Original Report Authenticated By: Charline Bills, M.D.    Assessment: 71 year-old [place]  with B-12 deficiency presenting with a macrocytic anemia, normal folate and ferritin but B-12 <100; with a low reticulocyte count, unremarkable platelet and white cell counts and differential, normal TSH, and normal T.bil; with a remote history of "B-12 shots" discontinued years ago when she changed physicians.   Plan: I discussed the pathophysiology of pernicious anemia with the patient and her family. We will start correction today while sending anti intrinsic factor antibody and other tests, and while supplementing her potassium which is currently normal but can drop significantly when B-12 replacement is initiated. Because of  the family history and the rare association of pernicious anemia with gastric carcinoma I would recommend GI consultation for EGD--this can be done as outpatient of course. Also the patient will need to be referred to a primary MD who can continue her monthly B-12 supplementation as outpatient.  I will follow peripherally while inpatient. Please let me know if I can be of further help at this point.   Sanjuana Mruk C 09/29/2011

## 2011-09-29 NOTE — Progress Notes (Addendum)
Subjective: Patient seen and examined this am. informs feeling slightly better. informs about 11 lbs wt loss in past 2 months. Admission H&P reviewed  Objective:  Vital signs in last 24 hours:  Filed Vitals:   09/29/11 0635 09/29/11 0735 09/29/11 0800 09/29/11 1524  BP: 101/64 102/64 102/64 107/52  Pulse: 71 65 65 65  Temp: 98.3 F (36.8 C) 98.2 F (36.8 C) 98.7 F (37.1 C) 97.9 F (36.6 C)  TempSrc: Oral Oral Oral Oral  Resp: 18 18 18 19   Height:      Weight:      SpO2:    98%    Intake/Output from previous day:   Intake/Output Summary (Last 24 hours) at 09/29/11 1559 Last data filed at 09/29/11 1000  Gross per 24 hour  Intake    880 ml  Output    951 ml  Net    -71 ml    Physical Exam:  General:elderly female  in no acute distress. HEENT: pallor+, no icterus, moist oral mucosa, no JVD, no lymphadenopathy Heart: Normal  s1 &s2  Regular rate and rhythm, without murmurs, rubs, gallops. Lungs: Clear to auscultation bilaterally. Abdomen: Soft, nontender, nondistended, positive bowel sounds. Extremities: No clubbing cyanosis or edema with positive pedal pulses. Neuro: Alert, awake, oriented x3, nonfocal.   Lab Results:  Basic Metabolic Panel:    Component Value Date/Time   NA 142 09/29/2011 0842   K 3.9 09/29/2011 0842   CL 111 09/29/2011 0842   CO2 23 09/29/2011 0842   BUN 27* 09/29/2011 0842   CREATININE 1.30* 09/29/2011 0842   GLUCOSE 98 09/29/2011 0842   CALCIUM 8.8 09/29/2011 0842   CBC:    Component Value Date/Time   WBC 5.2 09/29/2011 0842   HGB 7.9* 09/29/2011 0842   HCT 22.2* 09/29/2011 0842   PLT 136* 09/29/2011 0842   MCV 102.8* 09/29/2011 0842   NEUTROABS 2.5 09/29/2011 0842   LYMPHSABS 2.3 09/29/2011 0842   MONOABS 0.1 09/29/2011 0842   EOSABS 0.2 09/29/2011 0842   BASOSABS 0.0 09/29/2011 0842    No results found for this or any previous visit (from the past 240 hour(s)).  Studies/Results: Dg Chest 2 View  09/28/2011  *RADIOLOGY REPORT*  Clinical Data: Weakness,  dizziness  CHEST - 2 VIEW  Comparison: None.  Findings: Lungs are clear. No pleural effusion or pneumothorax.  The heart is top normal in size.  Degenerative changes of the visualized thoracolumbar spine.  IMPRESSION: No evidence of acute cardiopulmonary disease.  Original Report Authenticated By: Charline Bills, M.D.   Ct Head Wo Contrast  09/28/2011  *RADIOLOGY REPORT*  Clinical Data: Headaches, weakness, dizziness  CT HEAD WITHOUT CONTRAST  Technique:  Contiguous axial images were obtained from the base of the skull through the vertex without contrast.  Comparison: None.  Findings: No evidence of parenchymal hemorrhage or extra-axial fluid collection. No mass lesion, mass effect, or midline shift.  No CT evidence of acute infarction.  Mild subcortical white matter and periventricular small vessel ischemic changes.  Intracranial atherosclerosis.  Cerebral volume is age appropriate.  No ventriculomegaly.  The visualized paranasal sinuses are essentially clear. The mastoid air cells are unopacified.  No evidence of calvarial fracture.  IMPRESSION: No evidence of acute intracranial abnormality.  Mild small vessel ischemic changes with intracranial atherosclerosis.  Original Report Authenticated By: Charline Bills, M.D.    Medications: Scheduled Meds:   . sodium chloride   Intravenous STAT  . sodium chloride   Intravenous Once  . sodium  chloride  500 mL Intravenous Once  . acetaminophen  650 mg Oral Once  . cefTRIAXone (ROCEPHIN) IVPB 1 gram/50 mL D5W  1 g Intravenous Q24H  . cyanocobalamin  1,000 mcg Intramuscular Once  . cyanocobalamin  100 mcg Intramuscular Once  . diphenhydrAMINE  25 mg Oral Once  . docusate sodium  100 mg Oral BID  . pantoprazole  40 mg Oral Q1200  . potassium chloride  20 mEq Oral BID   Continuous Infusions:   . sodium chloride 100 mL/hr at 09/29/11 0847   PRN Meds:.acetaminophen, acetaminophen, albuterol, alum & mag hydroxide-simeth, guaiFENesin-dextromethorphan,  ondansetron (ZOFRAN) IV, ondansetron, zolpidem  Assessment  23 female with no significant medical hx presented with lightheadedness, fatigue and subjective wt loss for 2 months and found to be significantly anemic with high MCV.  Plan:   Anemia with macrocytosis  patient transfused with 2 U prbc on admission. Feels better now H&h improved 7.9. Will reevaluate in am and retransfuse if necessary  patient informed of taking b12 shots remotely to heme consult. Level of 59.  appreciate heme consult. recommend starting b12 injections given likely hood of pernicious anemia TSH wnl. stool for guiac negative. Iron levels wnl. Colonoscopy done 2 yrs back and as per pt was normal Anti parietal ab and MMA pending Heme recommends GI eval for EGD given association of gastric ca with pernicious anemia which can be done as outaptient kcl supplements  UTI  on ceftriaxone   AKI Likely from dehydration  will monitor on IV fluids  Diet: regular  Full code   LOS: 1 day   Romar Woodrick 09/29/2011, 3:59 PM

## 2011-09-30 ENCOUNTER — Encounter (HOSPITAL_COMMUNITY): Payer: Self-pay | Admitting: Physician Assistant

## 2011-09-30 DIAGNOSIS — D518 Other vitamin B12 deficiency anemias: Secondary | ICD-10-CM

## 2011-09-30 DIAGNOSIS — R634 Abnormal weight loss: Secondary | ICD-10-CM

## 2011-09-30 DIAGNOSIS — D510 Vitamin B12 deficiency anemia due to intrinsic factor deficiency: Principal | ICD-10-CM

## 2011-09-30 DIAGNOSIS — D131 Benign neoplasm of stomach: Secondary | ICD-10-CM

## 2011-09-30 LAB — CBC
HCT: 22.1 % — ABNORMAL LOW (ref 36.0–46.0)
Hemoglobin: 7.9 g/dL — ABNORMAL LOW (ref 12.0–15.0)
MCH: 37.1 pg — ABNORMAL HIGH (ref 26.0–34.0)
MCHC: 35.7 g/dL (ref 30.0–36.0)
MCV: 103.8 fL — ABNORMAL HIGH (ref 78.0–100.0)
Platelets: 125 10*3/uL — ABNORMAL LOW (ref 150–400)
RBC: 2.13 MIL/uL — ABNORMAL LOW (ref 3.87–5.11)
RDW: 25.1 % — ABNORMAL HIGH (ref 11.5–15.5)
WBC: 5 10*3/uL (ref 4.0–10.5)

## 2011-09-30 LAB — BASIC METABOLIC PANEL
BUN: 22 mg/dL (ref 6–23)
CO2: 25 mEq/L (ref 19–32)
Calcium: 8.2 mg/dL — ABNORMAL LOW (ref 8.4–10.5)
Chloride: 111 mEq/L (ref 96–112)
Creatinine, Ser: 1.29 mg/dL — ABNORMAL HIGH (ref 0.50–1.10)
GFR calc Af Amer: 47 mL/min — ABNORMAL LOW (ref >90)
GFR calc non Af Amer: 41 mL/min — ABNORMAL LOW (ref >90)
Glucose, Bld: 96 mg/dL (ref 70–99)
Potassium: 4.6 mEq/L (ref 3.5–5.1)
Sodium: 140 mEq/L (ref 135–145)

## 2011-09-30 LAB — PREPARE RBC (CROSSMATCH)

## 2011-09-30 LAB — ANTI-PARIETAL ANTIBODY: Parietal Cell Antibody-IgG: NEGATIVE

## 2011-09-30 NOTE — Progress Notes (Signed)
Subjective: Patient seen and examined this am. Informs feeling better  Objective:  Vital signs in last 24 hours:  Filed Vitals:   09/30/11 1330 09/30/11 1408 09/30/11 1430 09/30/11 1500  BP: 115/61 110/54 112/57 111/53  Pulse: 69 67 65 67  Temp: 98 F (36.7 C) 98 F (36.7 C) 98.8 F (37.1 C) 98.4 F (36.9 C)  TempSrc: Oral Oral Oral Oral  Resp: 17 17 16 18   Height:      Weight:      SpO2: 99% 98% 98% 96%    Intake/Output from previous day:   Intake/Output Summary (Last 24 hours) at 09/30/11 1607 Last data filed at 09/30/11 1502  Gross per 24 hour  Intake 3302.92 ml  Output    975 ml  Net 2327.92 ml    Physical Exam:   General:elderly female in no acute distress.  HEENT: pallor+, no icterus, moist oral mucosa, no JVD, no lymphadenopathy  Heart: Normal s1 &s2 Regular rate and rhythm, without murmurs, rubs, gallops.  Lungs: Clear to auscultation bilaterally.  Abdomen: Soft, nontender, nondistended, positive bowel sounds.  Extremities: No clubbing cyanosis or edema with positive pedal pulses.  Neuro: Alert, awake, oriented x3, nonfocal.   Lab Results:  Basic Metabolic Panel:    Component Value Date/Time   NA 140 09/30/2011 0620   K 4.6 09/30/2011 0620   CL 111 09/30/2011 0620   CO2 25 09/30/2011 0620   BUN 22 09/30/2011 0620   CREATININE 1.29* 09/30/2011 0620   GLUCOSE 96 09/30/2011 0620   CALCIUM 8.2* 09/30/2011 0620   CBC:    Component Value Date/Time   WBC 5.0 09/30/2011 0620   HGB 7.9* 09/30/2011 0620   HCT 22.1* 09/30/2011 0620   PLT 125* 09/30/2011 0620   MCV 103.8* 09/30/2011 0620   NEUTROABS 2.5 09/29/2011 0842   LYMPHSABS 2.3 09/29/2011 0842   MONOABS 0.1 09/29/2011 0842   EOSABS 0.2 09/29/2011 0842   BASOSABS 0.0 09/29/2011 0842    No results found for this or any previous visit (from the past 240 hour(s)).  Studies/Results: No results found.  Medications: Scheduled Meds:   . sodium chloride   Intravenous Once  . cefTRIAXone (ROCEPHIN) IVPB 1 gram/50 mL D5W  1 g  Intravenous Q24H  . cyanocobalamin  1,000 mcg Intramuscular Once  . cyanocobalamin  100 mcg Intramuscular Once  . docusate sodium  100 mg Oral BID  . pantoprazole  40 mg Oral Q1200  . potassium chloride  20 mEq Oral BID   Continuous Infusions:  PRN Meds:.acetaminophen, acetaminophen, albuterol, alum & mag hydroxide-simeth, guaiFENesin-dextromethorphan, ondansetron (ZOFRAN) IV, ondansetron, zolpidem  Assessment/Plan:  Anemia with macrocytosis  patient transfused with 2 U prbc on admission. Feels better now  H&h still 7.9 . transfuse further with 1U prbc patient informed of taking b12 shots remotely to heme consult. Level of 59.  appreciate heme consult. recommend starting b12 injections given likelyhood of pernicious anemia  TSH wnl. stool for guiac negative. Iron levels wnl. Colonoscopy done 2 yrs back and as per pt was normal  Anti parietal ab and MMA pending  Heme recommends GI eval for EGD given association of gastric ca with pernicious anemia  Seen by GI and plan on EGD with bx   UTI  on ceftriaxone   AKI  Likely from dehydration  On IV fluids  Diet: regular       LOS: 2 days   Netta Fodge 09/30/2011, 4:07 PM

## 2011-09-30 NOTE — Consult Note (Signed)
[facility] Consult: 11:03 AM 09/30/2011   Referring Provider: Dh[place] Primary Care Physician:  Does not have a primary MD Primary Gastroenterologist:  Dr. In [facility].  Pt does not recall his name or practice affiliation.  It is Dr Bascom Levels  Reason for Consultation:  B12 deficiency  HPI: Kathryn Thomas is a 71 y.o. female.  Admitted 09/27/2010 c/o weakness, positional dizzyness, loss of taste for at least  and anorexia for at least 2 months.  She has lost 11 # in last 2 weeks. .  Previously took B12 shots for energy enhancement but does not recall if any test were run or a diagnosis of anemia applied. In fact she starts crying when she tries to recall any past history and, per daughter, is emotionally labile at times.  She has not received B12 shots during her 40's, and only took the shots for 12 to 18 months when she felt low energy. She has a macrocytic anemia. Hgb was 6.5. MCV 114.7 on arrival yesterday. She has gotten 2 units of blood, Hgb now 7.9. Started on B12 injections over the weekend for B12 level of .  Dr Darnelle Catalan saw pt and recommended EGD to r/o gastric ca in setting of pernicious anemia.  Other than anorexia associated with diminished/absent taste sensation, she has no other GI sxs.  No reflux, no dysphagia, no BPR, no constipation/diarrhea.  Takes rare Ibuprofen at most 400 mg per month.  No etoh now or ever.  She had negative screening colonoscopy at most 2 years ago in [facility], does not recall name of MD or the GI group.  She was have problems passing stools. She underwent dilation of rectum and was told there were no other findings and to follow up in 10 years, .  Never has had an EGD.  Was treated for obesity with fentermine for 3 years, stopped this med about 5 months ago.  History reviewed. No pertinent past medical history.  Past Surgical History  Procedure Date  . Abdominal hysterectomy   . Back surgery 1980    three lower  spine surgeries.    Prior to Admission medications   Not on File    Scheduled Meds:    . sodium chloride   Intravenous STAT  . sodium chloride   Intravenous Once  . cefTRIAXone (ROCEPHIN) IVPB 1 gram/50 mL D5W  1 g Intravenous Q24H  . cyanocobalamin  1,000 mcg Intramuscular Once  . cyanocobalamin  100 mcg Intramuscular Once  . docusate sodium  100 mg Oral BID  . pantoprazole  40 mg Oral Q1200  . potassium chloride  20 mEq Oral BID   Infusions:   PRN Meds: acetaminophen, acetaminophen, albuterol, alum & mag hydroxide-simeth, guaiFENesin-dextromethorphan, ondansetron (ZOFRAN) IV, ondansetron, zolpidem   Allergies as of 09/28/2011  . (No Known Allergies)    Family History  Problem Relation Age of Onset  . Cervical cancer Mother   . Heart disease Father   . Multiple myeloma Sister     spleen involvement  . Cancer Sister     bone marrow    History   Social History  . Marital Status: Widowed    Spouse Name: N/A    Number of Children: N/A  . Years of Education: N/A   Occupational History  . Not on file.   Social History Main Topics  . Smoking status: Never Smoker   . Smokeless tobacco: Never Used  . Alcohol Use: No  . Drug Use: No  . Sexually  Active: No     REVIEW OF SYSTEMS: Constitutional:  Profound weakness with minor activity.  Weight loss of 60# in last 2 years, 11# drop in 2 weeks ENT:  No nose bleeds, no runny nose Pulm:  No SOB or cough CV:  No palpitations or chest pain GU:  No oliguria, dark urine, hematuria, dysuria. GI:  No dysphagia, no belly pain, no constipation or diarrhea.  No blood per rectum GYN:  No pap smear for many years. Heme:  As above.    Transfusions:  2 units of blood so far, this admission. Neuro:  Positional dizzyness/lightheadness Derm:  No itching, rash, non-healing sores Endocrine:  No hx elevated blood sugars Immunization:  No flu shot Travel:  None outside of state.    PHYSICAL EXAM: Vital signs in last 24  hours: Temp:  [97.9 F (36.6 C)-99.3 F (37.4 C)] 98.5 F (36.9 C) (01/07 1005) Pulse Rate:  [62-78] 66  (01/07 1005) Resp:  [16-19] 18  (01/07 1005) BP: (83-111)/(42-76) 111/47 mmHg (01/07 1005) SpO2:  [96 %-100 %] 100 % (01/07 1005) Weight:  [97.2 kg (214 lb 4.6 oz)] 214 lb 4.6 oz (97.2 kg) (01/07 0600)   Last BM Date: 09/29/11  General: overweight, pale.  WF who looks her age of 35 Head:  Normocephallic, atraumatic  Eyes:  No conjunctival pallor or icterus Ears:  Not HOH  Nose:  No discharge Mouth:  Moist and clear membranes Neck:  No JVD, no goiter, no mass Lungs:  Clear.  Not SOB.  No cough Heart: RRR, no MRG Abdomen:  Soft, NT, no mass, no HSM.  No bruit.   Rectal: not done   Musc/Skeltl: no joint swelling or deformity Extremities:  No edema  Neurologic:  A & O x 3.  No tremor.  Moves all 4 limbs. Skin:  No rash or sores Tattoos:  None. Nodes:  No cervical or inguinal adenopathy  Psych:  Emotionally labile,  Near tears at times during interview.    Intake/Output from previous day: 01/06 0701 - 01/07 0700 In: 2652.9 [P.O.:600; I.V.:2002.9; IV Piggyback:50] Out: 700 [Urine:700] Intake/Output this shift: Total I/O In: 240 [P.O.:240] Out: 300 [Urine:300]  LAB RESULTS:  Basename 09/30/11 0620 09/29/11 0842 09/28/11 2209  WBC 5.0 5.2 4.8  HGB 7.9* 7.9* 6.3*  HCT 22.1* 22.2* 17.4*  PLT 125* 136* 154   BMET Lab Results  Component Value Date   NA 140 09/30/2011   NA 142 09/29/2011   NA 140 09/28/2011   K 4.6 09/30/2011   K 3.9 09/29/2011   K 3.8 09/28/2011   CL 111 09/30/2011   CL 111 09/29/2011   CL 106 09/28/2011   CO2 25 09/30/2011   CO2 23 09/29/2011   CO2 24 09/28/2011   GLUCOSE 96 09/30/2011   GLUCOSE 98 09/29/2011   GLUCOSE 114* 09/28/2011   BUN 22 09/30/2011   BUN 27* 09/29/2011   BUN 31* 09/28/2011   CREATININE 1.29* 09/30/2011   CREATININE 1.30* 09/29/2011   CREATININE 1.50* 09/28/2011   CALCIUM 8.2* 09/30/2011   CALCIUM 8.8 09/29/2011   CALCIUM 9.2 09/28/2011   LFT Lab Results   Component Value Date   PROT 6.2 09/29/2011   PROT 7.0 09/28/2011   ALBUMIN 3.1* 09/29/2011   ALBUMIN 4.0 09/28/2011   AST 14 09/29/2011   AST 17 09/28/2011   ALT 13 09/29/2011   ALT 16 09/28/2011   ALKPHOS 55 09/29/2011   ALKPHOS 66 09/28/2011   BILITOT 1.4* 09/29/2011   BILITOT  1.1 09/28/2011   PT/INR Lab Results  Component Value Date   INR 1.05 09/28/2011   Hepatitis Panel No results found for this basename: HEPBSAG,HCVAB,HEPAIGM,HEPBIGM in the last 72 hours C-Diff No components found with this basename: cdiff    Drugs of Abuse  No results found for this basename: labopia,  cocainscrnur,  labbenz,  amphetmu,  thcu,  labbarb     RADIOLOGY STUDIES: Dg Chest 2 View  09/28/2011  *RADIOLOGY REPORT*  Clinical Data: Weakness, dizziness  CHEST - 2 VIEW  Comparison: None.  Findings: Lungs are clear. No pleural effusion or pneumothorax.  The heart is top normal in size.  Degenerative changes of the visualized thoracolumbar spine.  IMPRESSION: No evidence of acute cardiopulmonary disease.  Original Report Authenticated By: Charline Bills, M.D.   Ct Head Wo Contrast  09/28/2011  *RADIOLOGY REPORT*  Clinical Data: Headaches, weakness, dizziness  CT HEAD WITHOUT CONTRAST  Technique:  Contiguous axial images were obtained from the base of the skull through the vertex without contrast.  Comparison: None.  Findings: No evidence of parenchymal hemorrhage or extra-axial fluid collection. No mass lesion, mass effect, or midline shift.  No CT evidence of acute infarction.  Mild subcortical white matter and periventricular small vessel ischemic changes.  Intracranial atherosclerosis.  Cerebral volume is age appropriate.  No ventriculomegaly.  The visualized paranasal sinuses are essentially clear. The mastoid air cells are unopacified.  No evidence of calvarial fracture.  IMPRESSION: No evidence of acute intracranial abnormality.  Mild small vessel ischemic changes with intracranial atherosclerosis.  Original Report  Authenticated By: Charline Bills, M.D.    ENDOSCOPIC STUDIES: Colonoscopy about 2 years ago, rectum was dilated otherwise unremarkable..  Dr Bascom Levels  IMPRESSION: 1.  B12 deficiency, likely pernicious anemia. S/p transfusion PRBC and B12 shots initiated under Dr Darrall Dears guidance.  2.  Anxiety/depression. 3.  S/p rectal dilation about 2 years ago.  No problems passing stools since. PLAN: 1.  Can perform EGD tomorrow.  Will arrange, set for 11AM.   All was d/w pt. 2.  Needs to have a set follow up appt with a primary care MD before she leaves the hospital.   LOS: 2 days   Jennye Moccasin  09/30/2011, 11:03 AM Pager: [phone]

## 2011-09-30 NOTE — Progress Notes (Signed)
Utilization review completed.  

## 2011-09-30 NOTE — Consults (Signed)
I have reviewed the above note, examined the patient and agree with plan of treatment.  71 yo  With profound heme negative macrocytic anemia, questionable hx of B12 deficiency, was on B12 supplement for brief period of time 40 years ago. ? Nl TSH,  R/o B12 malabsorption. She denies hx of iron defifciency. Denies hx of GERD. R/O atrophic gastritis, check anti- IF antibodies, Anti parietal cells antibodies, serum gastrin. Plan to proceed with EGD and biopsies of gastric cardia and body, and gastric antrum as well as duodenal biopsies. She is up to date on her colonoscopy

## 2011-10-01 ENCOUNTER — Other Ambulatory Visit: Payer: Self-pay | Admitting: Internal Medicine

## 2011-10-01 ENCOUNTER — Encounter (HOSPITAL_COMMUNITY): Payer: Self-pay | Admitting: *Deleted

## 2011-10-01 ENCOUNTER — Encounter (HOSPITAL_COMMUNITY)
Admission: EM | Disposition: A | Discharge status: Home / Self Care | Payer: Self-pay | Source: Ambulatory Visit | Attending: Internal Medicine

## 2011-10-01 DIAGNOSIS — D519 Vitamin B12 deficiency anemia, unspecified: Secondary | ICD-10-CM | POA: Diagnosis present

## 2011-10-01 DIAGNOSIS — K317 Polyp of stomach and duodenum: Secondary | ICD-10-CM | POA: Diagnosis present

## 2011-10-01 DIAGNOSIS — D510 Vitamin B12 deficiency anemia due to intrinsic factor deficiency: Principal | ICD-10-CM | POA: Diagnosis present

## 2011-10-01 HISTORY — PX: ESOPHAGOGASTRODUODENOSCOPY: SHX5428

## 2011-10-01 LAB — URINE CULTURE
Colony Count: 5000
Culture  Setup Time: 201301080859

## 2011-10-01 LAB — CBC
HCT: 26.4 % — ABNORMAL LOW (ref 36.0–46.0)
Hemoglobin: 9.1 g/dL — ABNORMAL LOW (ref 12.0–15.0)
MCH: 34.3 pg — ABNORMAL HIGH (ref 26.0–34.0)
MCHC: 34.5 g/dL (ref 30.0–36.0)
MCV: 99.6 fL (ref 78.0–100.0)
Platelets: 121 10*3/uL — ABNORMAL LOW (ref 150–400)
RBC: 2.65 MIL/uL — ABNORMAL LOW (ref 3.87–5.11)
RDW: 25.5 % — ABNORMAL HIGH (ref 11.5–15.5)
WBC: 5.6 10*3/uL (ref 4.0–10.5)

## 2011-10-01 LAB — TYPE AND SCREEN
ABO/RH(D): A POS
Antibody Screen: NEGATIVE
Unit division: 0
Unit division: 0
Unit division: 0

## 2011-10-01 LAB — BASIC METABOLIC PANEL
BUN: 19 mg/dL (ref 6–23)
CO2: 22 mEq/L (ref 19–32)
Calcium: 8.5 mg/dL (ref 8.4–10.5)
Chloride: 112 mEq/L (ref 96–112)
Creatinine, Ser: 1.27 mg/dL — ABNORMAL HIGH (ref 0.50–1.10)
GFR calc Af Amer: 48 mL/min — ABNORMAL LOW (ref >90)
GFR calc non Af Amer: 41 mL/min — ABNORMAL LOW (ref >90)
Glucose, Bld: 94 mg/dL (ref 70–99)
Potassium: 4.2 mEq/L (ref 3.5–5.1)
Sodium: 141 mEq/L (ref 135–145)

## 2011-10-01 LAB — CREATININE, URINE, RANDOM: Creatinine, Urine: 138.1 mg/dL

## 2011-10-01 SURGERY — EGD (ESOPHAGOGASTRODUODENOSCOPY)
Anesthesia: Moderate Sedation

## 2011-10-01 MED ORDER — SODIUM CHLORIDE 0.9 % IJ SOLN
INTRAMUSCULAR | Status: DC | PRN
  Administered 2011-10-01: 14:00:00

## 2011-10-01 MED ORDER — FENTANYL NICU IV SYRINGE 50 MCG/ML
INJECTION | INTRAMUSCULAR | Status: DC | PRN
  Administered 2011-10-01 (×4): 25 ug via INTRAVENOUS

## 2011-10-01 MED ORDER — DIPHENHYDRAMINE HCL 50 MG/ML IJ SOLN
INTRAMUSCULAR | Status: AC
  Filled 2011-10-01: qty 1

## 2011-10-01 MED ORDER — FENTANYL CITRATE 0.05 MG/ML IJ SOLN
INTRAMUSCULAR | Status: AC
  Filled 2011-10-01: qty 4

## 2011-10-01 MED ORDER — MIDAZOLAM HCL 10 MG/2ML IJ SOLN
INTRAMUSCULAR | Status: AC
  Filled 2011-10-01: qty 4

## 2011-10-01 MED ORDER — BUTAMBEN-TETRACAINE-BENZOCAINE 2-2-14 % EX AERO
INHALATION_SPRAY | CUTANEOUS | Status: DC | PRN
  Administered 2011-10-01: 2 via TOPICAL

## 2011-10-01 MED ORDER — EPINEPHRINE HCL 0.1 MG/ML IJ SOLN
INTRAMUSCULAR | Status: AC
  Filled 2011-10-01: qty 10

## 2011-10-01 MED ORDER — PANTOPRAZOLE SODIUM 40 MG PO TBEC: 40.0000 mg | DELAYED_RELEASE_TABLET | Freq: Every day | ORAL | Status: DC

## 2011-10-01 MED ORDER — PNEUMOCOCCAL VAC POLYVALENT 25 MCG/0.5ML IJ INJ
0.5000 mL | INJECTION | INTRAMUSCULAR | Status: AC
  Administered 2011-10-02: 0.5 mL via INTRAMUSCULAR
  Filled 2011-10-01: qty 0.5

## 2011-10-01 MED ORDER — MIDAZOLAM HCL 10 MG/2ML IJ SOLN
INTRAMUSCULAR | Status: DC | PRN
  Administered 2011-10-01 (×4): 2 mg via INTRAVENOUS

## 2011-10-01 NOTE — Op Note (Signed)
[facility] [address] [place], [place]  [address]  ENDOSCOPY PROCEDURE REPORT  PATIENT:  Kathryn Thomas, Kathryn Thomas  MR#:  [id number] BIRTHDATE:  1941-02-11, 70 yrs. old  GENDER:  female  ENDOSCOPIST:  Hedwig Morton. Juanda Chance, MD Referred by:  Ruthann Cancer, M.D.  PROCEDURE DATE:  10/01/2011 PROCEDURE:  EGD with biopsy, 43239, EGD with snare polypectomy, EGD for control of bleeding ASA CLASS:  Class II INDICATIONS:  anemia B12 deficiency,anemia, heme positive  MEDICATIONS:   These medications were titrated to patient response per physician's verbal order, Versed 8 mg, Fentanyl 100 mcg TOPICAL ANESTHETIC:  Cetacaine Spray  DESCRIPTION OF PROCEDURE:   After the risks benefits and alternatives of the procedure were thoroughly explained, informed consent was obtained.  The Pentax Gastroscope [id number] endoscope was introduced through the mouth and advanced to the second portion of the duodenum, without limitations.  The instrument was slowly withdrawn as the mucosa was fully examined. <<PROCEDUREIMAGES>>  There were multiple polyps identified. in the body of the stomach. one giant 3-4 cm pedunculated polyp along greater curvature of the stomach,4 smaller 10-15 mm pedunculated polyps in gastric body Polyp was snared, then cauterized with monopolar cautery. Polyp was retrieved and sent to pathology (see image001, image004, image006, image007, image008, and image009). hot snare polypectomies, piecemeal polypectomy of the giant polyp,, the polyp stalk was injected with 2cc of Epinephrine, 2 endoclips were placed on the remaineder of the thick stalk, no bleeding occurred  Otherwise the examination was normal. Biopsies small bowl, gastric antrum and gastric body  Severe gastritis was found (see image009 and image007). atrophic gastritis, absent rugal folds    Retroflexed views revealed no abnormalities.    The scope was then withdrawn from the patient and the  procedure completed.  COMPLICATIONS:  None  ENDOSCOPIC IMPRESSION: 1) Polyps, multiple in the body of the stomach 2) Otherwise normal examination 3) Severe gastritis one giant 3-4 cm mulltilobulated polyp removed piecemeal from gastric body, s/p Epinephrine injection and placement of endoclips to confirm hemostasis  atrophic gastritis RECOMMENDATIONS: 1) Await pathology results bleeding likely from the large polyp, I suspect atrophic gastritis /gastric polyps  REPEAT EXAM:  In 1 year(s) for.  ______________________________ Hedwig Morton. Juanda Chance, MD  CC:  n. eSIGNED:   Hedwig Morton. Cristofher Livecchi at 10/01/2011 02:02 PM  Murvin Donning, [phone]

## 2011-10-01 NOTE — Progress Notes (Signed)
   CARE MANAGEMENT NOTE 10/01/2011  Patient:  Kathryn Thomas, Kathryn Thomas   Account Number:  [id number]  Date Initiated:  10/01/2011  Documentation initiated by:  Letha Cape  Subjective/Objective Assessment:   dx anemia  admit     Action/Plan:   egd today   Anticipated DC Date:  10/01/2011   Anticipated DC Plan:  HOME/SELF CARE      DC Planning Services  CM consult      Choice offered to / List presented to:             Status of service:  Completed, signed off Medicare Important Message given?   (If response is "NO", the following Medicare IM given date fields will be blank) Date Medicare IM given:   Date Additional Medicare IM given:    Discharge Disposition:  HOME/SELF CARE  Per UR Regulation:    Comments:  10/01/11 15:12 Letha Cape RN, BSN [phone] Patient for EGD today, patient for possible dc today , no needs identified.

## 2011-10-01 NOTE — Discharge Summary (Signed)
Patient ID: Kathryn Thomas MRN: [id number] DOB/AGE: [birth year] 71 y.o.  Admit date: 09/28/2011 Discharge date: 10/02/2011  Primary Care Physician:   Patient is in the process of getting a new PCP.   Discharge Diagnoses:    Present on Admission:  .Pernicious Anemia  . vitamin B12 deficiency .Weight loss .Positional lightheadedness .Acute renal failure .UTI (lower urinary tract infection) . Bleeding gastric polyps    Current Discharge Medication List    START taking these medications   Details  pantoprazole (PROTONIX) 40 MG tablet Take 1 tablet (40 mg total) by mouth daily. Qty: 30 tablet, Refills: 0        Disposition and Follow-up: Needs PCP for outpatient follow up and monthly B12 injections Follow up with Dr Darnelle Catalan in 4 weeks Follow up with Dr Juanda Chance in 4 weeks and need to follow gastric biopsy results as outpt  Consults:   Valentino Hue Magrinat,( HEMATOLOGY) Juanda Chance ( GI)  Significant Diagnostic Studies:  Dg Chest 2 View  09/28/2011  *RADIOLOGY REPORT*  Clinical Data: Weakness, dizziness  CHEST - 2 VIEW  Comparison: None.  Findings: Lungs are clear. No pleural effusion or pneumothorax.  The heart is top normal in size.  Degenerative changes of the visualized thoracolumbar spine.  IMPRESSION: No evidence of acute cardiopulmonary disease.  Original Report Authenticated By: Charline Bills, M.D.   Ct Head Wo Contrast  09/28/2011  *RADIOLOGY REPORT*  Clinical Data: Headaches, weakness, dizziness  CT HEAD WITHOUT CONTRAST  Technique:  Contiguous axial images were obtained from the base of the skull through the vertex without contrast.  Comparison: None.  Findings: No evidence of parenchymal hemorrhage or extra-axial fluid collection. No mass lesion, mass effect, or midline shift.  No CT evidence of acute infarction.  Mild subcortical white matter and periventricular small vessel ischemic changes.  Intracranial atherosclerosis.  Cerebral volume is age appropriate.  No  ventriculomegaly.  The visualized paranasal sinuses are essentially clear. The mastoid air cells are unopacified.  No evidence of calvarial fracture.  IMPRESSION: No evidence of acute intracranial abnormality.  Mild small vessel ischemic changes with intracranial atherosclerosis.  Original Report Authenticated By: Charline Bills, M.D.    Brief H and P: For complete details please refer to admission H and P, but in brief    Physical Exam on Discharge:  Filed Vitals:   10/01/11 1418 10/01/11 1425 10/01/11 1426 10/01/11 1514  BP: 114/60 117/64 117/64 139/83  Pulse:    60  Temp:    98.2 F (36.8 C)  TempSrc:    Oral  Resp: 15 19 13 17   Height:      Weight:      SpO2: 96% 96% 93% 96%     Intake/Output Summary (Last 24 hours) at 10/01/11 1701 Last data filed at 10/01/11 0842  Gross per 24 hour  Intake    640 ml  Output    725 ml  Net    -85 ml    General: Alert, awake, oriented x3, in no acute distress. HEENT: No bruits, no goiter. Heart: Regular rate and rhythm, without murmurs, rubs, gallops. Lungs: Clear to auscultation bilaterally. Abdomen: Soft, nontender, nondistended, positive bowel sounds. Extremities: No clubbing cyanosis or edema with positive pedal pulses. Neuro: Grossly intact, nonfocal.  CBC:    Component Value Date/Time   WBC 5.6 10/01/2011 0500   HGB 9.1* 10/01/2011 0500   HCT 26.4* 10/01/2011 0500   PLT 121* 10/01/2011 0500   MCV 99.6 10/01/2011 0500   NEUTROABS 2.5 09/29/2011 1308  LYMPHSABS 2.3 09/29/2011 0842   MONOABS 0.1 09/29/2011 0842   EOSABS 0.2 09/29/2011 0842   BASOSABS 0.0 09/29/2011 0842    Basic Metabolic Panel:    Component Value Date/Time   NA 141 10/01/2011 0500   K 4.2 10/01/2011 0500   CL 112 10/01/2011 0500   CO2 22 10/01/2011 0500   BUN 19 10/01/2011 0500   CREATININE 1.27* 10/01/2011 0500   GLUCOSE 94 10/01/2011 0500   CALCIUM 8.5 10/01/2011 0500    Hospital Course:  Anemia likely due to pernicious anemia with B12 deficiency patient transfused with  2 U prbc on admission and further 1 unit on day 2 H&h improved to 9.1 patient informed of taking b12 shots remotely to heme consult.  b12 Level of 59.  appreciate heme consult. recommend starting b12 injections given likelyhood of pernicious anemia . Given 1 dose during hospitalization TSH wnl. stool for guiac negative. Iron levels wnl. Colonoscopy done 2 yrs back and as per pt was normal  Anti parietal ab and MMA pending  GI eval for EGD given association of gastric ca with pernicious anemia .   gastric polyps: Patient had EGD on1/8 showing  multiple polyps in the body of the stomach ,  Severe gastritis and one giant 3-4 cm mulltilobulated polyp removed piecemeal from  gastric body, s/p Epinephrine injection and placement of endoclips done.  biopsies taken and should be followed as outpatient. Follow gastrin Cont PPI  UTI  on ceftriaxone ( completed 3 day treatment on 1/8)  AKI  Likely from dehydration  Improved with IV fluids  Patient symptomatically improved.  can be discharged home with outpatient follow up  Time spent on Discharge: 45 minutes  Signed: Eddie North 10/01/2011, 5:01 PM

## 2011-10-02 ENCOUNTER — Telehealth: Payer: Self-pay | Admitting: Oncology

## 2011-10-02 ENCOUNTER — Encounter: Payer: Self-pay | Admitting: Internal Medicine

## 2011-10-02 ENCOUNTER — Encounter (HOSPITAL_COMMUNITY): Payer: Self-pay | Admitting: Internal Medicine

## 2011-10-02 LAB — CBC
HCT: 26.5 % — ABNORMAL LOW (ref 36.0–46.0)
Hemoglobin: 9.2 g/dL — ABNORMAL LOW (ref 12.0–15.0)
MCH: 35.2 pg — ABNORMAL HIGH (ref 26.0–34.0)
MCHC: 34.7 g/dL (ref 30.0–36.0)
MCV: 101.5 fL — ABNORMAL HIGH (ref 78.0–100.0)
Platelets: 120 10*3/uL — ABNORMAL LOW (ref 150–400)
RBC: 2.61 MIL/uL — ABNORMAL LOW (ref 3.87–5.11)
RDW: 24.7 % — ABNORMAL HIGH (ref 11.5–15.5)
WBC: 6.3 10*3/uL (ref 4.0–10.5)

## 2011-10-02 LAB — INTRINSIC FACTOR ANTIBODIES: Intrinsic Factor: POSITIVE

## 2011-10-02 LAB — METHYLMALONIC ACID, SERUM: Methylmalonic Acid, Quantitative: 78.1 umol/L — ABNORMAL HIGH (ref <0.40)

## 2011-10-02 MED ORDER — PANTOPRAZOLE SODIUM 40 MG PO TBEC: 40.0000 mg | DELAYED_RELEASE_TABLET | Freq: Every day | ORAL | Status: DC

## 2011-10-02 MED ORDER — CYANOCOBALAMIN 1000 MCG/ML IJ SOLN
1000.0000 ug | Freq: Once | INTRAMUSCULAR | Status: AC
  Administered 2011-10-02: 1000 ug via INTRAMUSCULAR
  Filled 2011-10-02 (×2): qty 1

## 2011-10-02 MED ORDER — INFLUENZA VIRUS VACC SPLIT PF IM SUSP
0.5000 mL | INTRAMUSCULAR | Status: AC | PRN
  Administered 2011-10-02: 0.5 mL via INTRAMUSCULAR
  Filled 2011-10-02: qty 0.5

## 2011-10-02 MED ORDER — PNEUMOCOCCAL VAC POLYVALENT 25 MCG/0.5ML IJ INJ: 0.5000 mL | INJECTION | INTRAMUSCULAR | Status: DC

## 2011-10-02 MED ORDER — LORAZEPAM 0.5 MG PO TABS: 0.5000 mg | ORAL_TABLET | Freq: Three times a day (TID) | ORAL | Status: AC

## 2011-10-02 NOTE — Progress Notes (Signed)
Subjective No complaints, s/p EGD with snare polypectomy of several large polyps  Objective: Vital signs in last 24 hours: Temp:  [97.8 F (36.6 C)-98.9 F (37.2 C)] 98.3 F (36.8 C) (01/09 0422) Pulse Rate:  [54-66] 54  (01/09 0422) Resp:  [12-31] 16  (01/09 0422) BP: (114-177)/(47-92) 132/51 mmHg (01/09 0422) SpO2:  [93 %-100 %] 97 % (01/09 0422) Weight:  [91.5 kg (201 lb 11.5 oz)] 91.5 kg (201 lb 11.5 oz) (01/09 0422) Last BM Date: 09/30/11 General:   Alert,  pleasant, cooperative in NAD Head:  Normocephalic and atraumatic. Eyes:  Sclera clear, no icterus.   Conjunctiva pink. Mouth:  No deformity or lesions, dentition normal., tongue papilla ted, Neck:  Supple; no masses or thyromegaly. Heart:  Regular rate and rhythm; no murmurs, clicks, rubs,  or gallops. Lungs:  No wheezes or rales Abdomen:  Soft, nontender, active bowl sounds  Msk:  Symmetrical without gross deformities. Normal posture. Pulses:  Normal pulses noted. Extremities:  Without clubbing or edema. Neurologic:  Alert and  oriented x4;  grossly normal neurologically. Skin:  Intact without significant lesions or rashes.  Intake/Output from previous day: 01/08 0701 - 01/09 0700 In: 480 [P.O.:480] Out: 1700 [Urine:1700] Intake/Output this shift:    Lab Results:  Basename 10/02/11 0610 10/01/11 0500 09/30/11 0620  WBC 6.3 5.6 5.0  HGB 9.2* 9.1* 7.9*  HCT 26.5* 26.4* 22.1*  PLT 120* 121* 125*   BMET  Basename 10/01/11 0500 09/30/11 0620 09/29/11 0842  NA 141 140 142  K 4.2 4.6 3.9  CL 112 111 111  CO2 22 25 23   GLUCOSE 94 96 98  BUN 19 22 27*  CREATININE 1.27* 1.29* 1.30*  CALCIUM 8.5 8.2* 8.8   LFT  Basename 09/29/11 0842  PROT 6.2  ALBUMIN 3.1*  AST 14  ALT 13  ALKPHOS 55  BILITOT 1.4*  BILIDIR --  IBILI --   PT/INR No results found for this basename: LABPROT:2,INR:2 in the last 72 hours Hepatitis Panel No results found for this basename: HEPBSAG,HCVAB,HEPAIGM,HEPBIGM in the last 72  hours  Studies/Results: No results found.   ASSESSMENT:   Principal Problem:  *Pernicious anemia Active Problems:  Anemia  Weight loss  Positional lightheadedness  Acute renal failure  UTI (lower urinary tract infection)  Multiple gastric polyps  B12 deficiency anemia     PLAN:   From GI standpoint she may be discharged today, would give additional B12 1000ug IM before discharge She wants to establish a PCP close to where she lives ,who will monitor B12, CBC etc and give initial B12 injections till she learns how to give them  await pathology report from polyps Recall EGD 1 year     LOS: 4 days   Lina Sar  10/02/2011, 8:04 AM

## 2011-10-02 NOTE — Progress Notes (Signed)
10/02/2011  2:44 PM   Surgical Pathology Report From 10/01/2011  FINAL DIAGNOSIS Diagnosis 1. Duodenum, NOS biopsy - HISTOLOGICALLY NORMAL SMALL BOWEL MUCOSA. - NEGATIVE FOR MORPHOLOGICAL FEATURES OF GLUTEN SENSITIVE ENTEROPATHY (SPRUE). - NO GIARDIA IDENTIFIED. 2. Stomach, biopsy, Antrum - MODERATE CHRONIC INACTIVE GASTRITIS (ANTRAL MUCOSA). - WARTHIN STARRY STAIN NEGATIVE FOR H. PYLORI. - NEGATIVE FOR INTESTINAL METAPLASIA, DYSPLASIA, OR MALIGNANCY. 3. Stomach, biopsy, Body - MODERATE CHRONIC ACTIVE ATROPHIC GASTRITIS WITH INTESTINAL METAPLASIA. - WARTHIN-STARRY STAIN NEGATIVE FOR H. PYLORI. - NEGATIVE FOR DYSPLASIA OR MALIGNANCY. 4. Stomach, polyp(s) - GASTRIC ADENOMA WITH HIGH GRADE DYSPLASIA (3.5 CM), SEE COMMENT. - NEGATIVE FOR INVASIVE MALIGNANCY. - LOW GRADE DYSPLASIA IS PRESENT AT CAUTERIZED TISSUE EDGE/POLYPECTOMY MARGIN. Microscopic Comment 4. The case was discussed with Dr. Juanda Chance on 10/02/2011. (CRR:eps 10/02/11) [place] RUND DO Pathologist,   Pt will need follow up EGD in next 2 to 3 months to reassess the gastric adenoma rather than the previously sited one year follow up mentioned in Dr Regino Schultze previous notes..  I discussed these findings with the patient and arranged ROV with Dr Juanda Chance for Feb 12th.    Jennye Moccasin, PA-C

## 2011-10-02 NOTE — Discharge Planning (Signed)
Patient tolerated EGD yesterday without event; pathology is pending. Hb is improved and patient is asymptomatic as far as her anemia is concerned. Discharge scheduled for today, per patient.  I have discussed B12 deficiency extensively with the patient and she understands she will need lifelong replacement. This can easily be done through a primary care MD but the patient currently has none. I have set her up to see me Thursday, Jan 17 at 1 PM at the [facility]; we will give her the next B-12 shot then and help her find a primary care MD if she has not done it by then. We will also follow-up on results of her pathology from yesterday's procedure.  She does not require further KCl supplementation and I am discontinuing that today.  Will sign off at this time. Please let me know if I can be of further help.

## 2011-10-02 NOTE — Discharge Summary (Addendum)
8:49 AM I agree with the d/c summary per Dr. Gonzella Lex       BP 132/51  Pulse 54  Temp(Src) 98.3 F (36.8 C) (Oral)  Resp 16  Ht 5\' 7"  (1.702 m)  Wt 91.5 kg (201 lb 11.5 oz)  BMI 31.59 kg/m2  SpO2 97%  General Appearance:    Alert, cooperative, no distress, appears stated age  Head:    Normocephalic, without obvious abnormality, atraumatic  Eyes:    PERRL, conjunctiva/corneas clear, EOM's intact, fundi    benign, both eyes  Ears:    Normal TM's and external ear canals, both ears  Nose:   Nares normal, septum midline, mucosa normal, no drainage    or sinus tenderness  Throat:   Lips, mucosa, and tongue normal; teeth and gums normal  Neck:   Supple, symmetrical, trachea midline, no adenopathy;    thyroid:  no enlargement/tenderness/nodules; no carotid   bruit or JVD  Back:     Symmetric, no curvature, ROM normal, no CVA tenderness  Lungs:     Clear to auscultation bilaterally, respirations unlabored  Chest Wall:    No tenderness or deformity   Heart:    Regular rate and rhythm, S1 and S2 normal, no murmur, rub   or gallop  Breast Exam:    No tenderness, masses, or nipple abnormality  Abdomen:     Soft, non-tender, bowel sounds active all four quadrants,    no masses, no organomegaly  Genitalia:    Normal female without lesion, discharge or tenderness  Rectal:    Normal tone, normal prostate, no masses or tenderness;   guaiac negative stool  Extremities:   Extremities normal, atraumatic, no cyanosis or edema  Pulses:   2+ and symmetric all extremities  Skin:   Skin color, texture, turgor normal, no rashes or lesions  Lymph nodes:   Cervical, supraclavicular, and axillary nodes normal  Neurologic:   CNII-XII intact, normal strength, sensation and reflexes    throughout   Further plans per Dr. Juanda Chance, who has arranged follow-up as an out-patient. Patient has no significant PMH and will need review by a PCP whom she agrees to establish with in the [place] area  Pleas Koch,  MD [facility] [phone]

## 2011-10-02 NOTE — Telephone Encounter (Signed)
called pts nurse annie and informed her of appt on 10/08/2011 to put in pts discharge papers

## 2011-10-02 NOTE — Progress Notes (Signed)
   CARE MANAGEMENT NOTE 10/02/2011  Patient:  Kathryn Thomas, Kathryn Thomas   Account Number:  [id number]  Date Initiated:  10/01/2011  Documentation initiated by:  Letha Cape  Subjective/Objective Assessment:   dx anemia  admit- lives alone, pta independent.     Action/Plan:   egd today   Anticipated DC Date:  10/02/2011   Anticipated DC Plan:  HOME/SELF CARE      DC Planning Services  CM consult      Choice offered to / List presented to:             Status of service:  Completed, signed off Medicare Important Message given?   (If response is "NO", the following Medicare IM given date fields will be blank) Date Medicare IM given:   Date Additional Medicare IM given:    Discharge Disposition:  HOME/SELF CARE  Per UR Regulation:    Comments:  10/02/11 11:15 Letha Cape RN, BSN [phone] Patient for dc today.  10/01/11 15:12 Letha Cape RN, BSN [phone] Patient for EGD today, patient for possible dc today , no needs identified.

## 2011-10-04 LAB — GASTRIN: Gastrin: 2033 pg/mL — ABNORMAL HIGH (ref <101)

## 2011-10-07 ENCOUNTER — Other Ambulatory Visit: Payer: Self-pay | Admitting: Oncology

## 2011-10-07 DIAGNOSIS — D519 Vitamin B12 deficiency anemia, unspecified: Secondary | ICD-10-CM

## 2011-10-07 DIAGNOSIS — D510 Vitamin B12 deficiency anemia due to intrinsic factor deficiency: Secondary | ICD-10-CM

## 2011-10-08 ENCOUNTER — Other Ambulatory Visit: Payer: Self-pay | Admitting: Oncology

## 2011-10-08 ENCOUNTER — Ambulatory Visit (HOSPITAL_COMMUNITY)
Admission: RE | Admit: 2011-10-08 | Discharge: 2011-10-08 | Disposition: A | Discharge status: Home / Self Care | Payer: Medicare Other | Source: Ambulatory Visit | Attending: Oncology | Admitting: Oncology

## 2011-10-08 ENCOUNTER — Ambulatory Visit (HOSPITAL_BASED_OUTPATIENT_CLINIC_OR_DEPARTMENT_OTHER): Payer: Medicare Other | Admitting: Physician Assistant

## 2011-10-08 ENCOUNTER — Other Ambulatory Visit (HOSPITAL_BASED_OUTPATIENT_CLINIC_OR_DEPARTMENT_OTHER): Payer: Medicare Other | Admitting: Lab

## 2011-10-08 ENCOUNTER — Encounter: Payer: Self-pay | Admitting: Physician Assistant

## 2011-10-08 DIAGNOSIS — R600 Localized edema: Secondary | ICD-10-CM

## 2011-10-08 DIAGNOSIS — D518 Other vitamin B12 deficiency anemias: Secondary | ICD-10-CM

## 2011-10-08 DIAGNOSIS — D510 Vitamin B12 deficiency anemia due to intrinsic factor deficiency: Secondary | ICD-10-CM

## 2011-10-08 DIAGNOSIS — D519 Vitamin B12 deficiency anemia, unspecified: Secondary | ICD-10-CM

## 2011-10-08 DIAGNOSIS — M79609 Pain in unspecified limb: Secondary | ICD-10-CM

## 2011-10-08 DIAGNOSIS — R609 Edema, unspecified: Secondary | ICD-10-CM

## 2011-10-08 DIAGNOSIS — M7989 Other specified soft tissue disorders: Secondary | ICD-10-CM

## 2011-10-08 LAB — CBC & DIFF AND RETIC
BASO%: 0.3 % (ref 0.0–2.0)
Basophils Absolute: 0 10*3/uL (ref 0.0–0.1)
EOS%: 4.8 % (ref 0.0–7.0)
Eosinophils Absolute: 0.4 10*3/uL (ref 0.0–0.5)
HCT: 33.4 % — ABNORMAL LOW (ref 34.8–46.6)
HGB: 10.9 g/dL — ABNORMAL LOW (ref 11.6–15.9)
Immature Retic Fract: 8.9 % (ref 1.60–10.00)
LYMPH%: 29.5 % (ref 14.0–49.7)
MCH: 33.1 pg (ref 25.1–34.0)
MCHC: 32.6 g/dL (ref 31.5–36.0)
MCV: 101.5 fL — ABNORMAL HIGH (ref 79.5–101.0)
MONO#: 0.5 10*3/uL (ref 0.1–0.9)
MONO%: 6.3 % (ref 0.0–14.0)
NEUT#: 4.5 10*3/uL (ref 1.5–6.5)
NEUT%: 59.1 % (ref 38.4–76.8)
Platelets: 369 10*3/uL (ref 145–400)
RBC: 3.29 10*6/uL — ABNORMAL LOW (ref 3.70–5.45)
RDW: 20.2 % — ABNORMAL HIGH (ref 11.2–14.5)
Retic %: 4.12 % — ABNORMAL HIGH (ref 0.70–2.10)
Retic Ct Abs: 135.55 10*3/uL — ABNORMAL HIGH (ref 33.70–90.70)
WBC: 7.6 10*3/uL (ref 3.9–10.3)
lymph#: 2.3 10*3/uL (ref 0.9–3.3)

## 2011-10-08 NOTE — Progress Notes (Signed)
Right lower extremity venous duplex completed.  Preliminary report is negative for DVT, SVT, or a Baker's cyst in the right leg.  Negative for DVT in the left common femoral vein. Smiley Houseman 10/08/2011, 5:07 PM

## 2011-10-08 NOTE — Progress Notes (Signed)
Hematology and Oncology Follow Up Visit  Kathryn Thomas [id number] 10-02-1940 71 y.o. 10/08/2011    HPI: Kathryn Thomas presented to the hospital in January 2013 with a two-month history of weakness (nonfocal), dizzyness (no falls), altered taste, 11 lb weight loss, and hair loss; with no primary MD. She was found to have a Hb of 6.3, MCV 114, and low B12.  She was admitted,  Transfused, and started on Vitamin B12 injections . She received 2 injections during her hospitalization, January 7 in January 9.   She apparently has a history of pernicious anemia, and in the past has received B12 injections. She changed physicians, and the injections were stopped years ago per her report.  Patient is status post GI workup during hospitalization, including endoscopy with Dr. Julio Alm.  A polyp was removed, pathology being negative for invasive malignancy but showing gastric adenoma with high-grade dysplasia. There is no evidence of malignancy elsewhere.   Interim History:   Kathryn Thomas returns today accompanied by 2 of her daughters, is Kathryn Thomas and Kathryn Thomas for followup of her pernicious anemia. She's recently hospitalized, transfused, and received 2 injections of vitamin B12, most recently given on 10/02/2011. Since her discharge, she is really noticed only a small amount of improvement in her energy level. She does deny any signs of abnormal bleeding. She's had no abdominal pain. She continues to have loss of appetite with some taste aversion, but no actual nausea. No change in bowel habits. She continues to have some shortness of breath with exertion. She denies any chest pain, pressure, or palpitations. She does feel diffuse muscle weakness.  Patient is complaining today of some pain and swelling in her right lower extremity which is new. She's had some recent knee pain in the right side, but notes a recent increase in swelling from the knee down. No obvious redness. Recall that she was recently hospitalized, and  of course secondary to fatigue, has been more sedentary than usual.   A detailed review of systems is otherwise noncontributory as noted below.  Review of Systems: Constitutional:  fatigued, generally weak and loss of appetite, no fevers, chills Eyes: negative QIO:[id number] Cardiovascular: no chest pain or dyspnea on exertion Respiratory: positive for - shortness of breath negative for - cough, hemoptysis, orthopnea or pleuritic pain Neurological: no TIA or stroke symptoms negative Dermatological: negative Gastrointestinal: no abdominal pain, nausea, change in bowel habits, or black or bloody stools Genito-Urinary: no dysuria, trouble voiding, or hematuria Hematological and Lymphatic: negative Breast: negative Musculoskeletal: positive for - pain in leg - right and swelling in leg - right Remaining ROS negative.  FHx: mother died from cancer but she does not know the type; father died from MI. She had 4 sisters, 2 brothers. 3 sisters died from cancer ("of the bone marrow, sleep and cervix"). One brother died from stomach cancer; the other from a "very rare" type of cancer--she does not recall the name.   GYN Hx: GX P4; s/p hysterectomy   Social Hx: has 4 children, 4 grandchildren; lives alone; is a Control and instrumentation engineer   Medications:   I have reviewed the patient's current medications.  Current Outpatient Prescriptions  Medication Sig Dispense Refill  . pantoprazole (PROTONIX) 40 MG tablet Take 1 tablet (40 mg total) by mouth daily.  30 tablet  0  . vitamin B-12 (CYANOCOBALAMIN) 1000 MCG tablet Take 1,000 mcg by mouth every 30 (thirty) days. GIVEN BY PT AT HOME -- SUBQ EVERY MONTH, BEGINNING 10/30/11      .  LORazepam (ATIVAN) 0.5 MG tablet Take 1 tablet (0.5 mg total) by mouth every 8 (eight) hours.  30 tablet  0    Allergies: No Known Allergies  Physical Exam: Filed Vitals:   10/08/11 1451  BP: 127/74  Pulse: 67  Temp: 98 F (36.7 C)   HEENT:  Sclerae anicteric, conjunctivae  pink.  Oropharynx clear.   buccal mucosa is pink and moist. No mucositis or candidiasis.   Breast Exam: Deferred Lungs:  Clear to auscultation bilaterally.  No crackles, rhonchi, or wheezes.   Heart:  Regular rate and rhythm.   no gallops, murmurs, or rubs.  Abdomen:  Soft, nontender.  Positive bowel sounds.  No organomegaly or masses palpated.   Musculoskeletal:  No focal spinal tenderness to palpation.  Extremities:  1+ pitting edema in the right lower extremity, no erythema, no palpable cords. Negative Homans sign. No edema in the left lower extremity, and no edema in the upper extremities. No peripheral cyanosis.    Skin:  Benign.   Neuro:  Nonfocal.   Lab Results: Lab Results  Component Value Date   WBC 7.6 10/08/2011   HGB 10.9* 10/08/2011   HCT 33.4* 10/08/2011   MCV 101.5* 10/08/2011   PLT 369 10/08/2011   NEUTROABS 4.5 10/08/2011     Chemistry      Component Value Date/Time   NA 141 10/01/2011 0500   K 4.2 10/01/2011 0500   CL 112 10/01/2011 0500   CO2 22 10/01/2011 0500   BUN 19 10/01/2011 0500   CREATININE 1.27* 10/01/2011 0500      Component Value Date/Time   CALCIUM 8.5 10/01/2011 0500   ALKPHOS 55 09/29/2011 0842   AST 14 09/29/2011 0842   ALT 13 09/29/2011 0842   BILITOT 1.4* 09/29/2011 0842        Assessment:  71 year-old [place] woman with   (1)  B-12 deficiency presenting with a macrocytic anemia, normal folate and ferritin but B-12 <100; with a low reticulocyte count, unremarkable platelet and white cell counts and differential, normal TSH, and normal T.bil;   (2)  with a remote history of "B-12 shots" discontinued years ago when she changed physicians.  (3)  status post recent hospitalization for transfusions and initiation of vitamin B12 injections.   Plan:  This case was reviewed with Dr. Darnelle Catalan was also spoken with the patient and her family today. Over half of our 60 minute appointment was spent counseling her regarding her diagnosis and coordinating her care and  future appointments.  With regards to the pernicious anemia, [name]'s hemoglobin is recovering nicely, with a high reticulocyte count today. She is due for her next vitamin B12 injection in 3 weeks on February 6. After much discussion, her daughter, Kathryn Thomas, plans on giving her the injections at home. Our nurse, Rona Ravens, RN, gave teaching on how to administer the injections during the appointment today.  They understand that Carlota will need the injections on a monthly basis. We will plan on repeating labs in approximately 3 months, specifically a CBC and reticulocyte count. She will return to see [place] in August, and at that point, we will plan on initiating annual visits. Of course if she establishes herself with a local primary care physician, they can take over with this care, and she would not need to return here. We'll leave that up to [facility], but I did encourage her strongly to establish herself with a local internist or primary doctor.   Otherwise, I have referred [place] for  a Doppler of the right lower extremity to evaluate for possible DVT in the setting of pain, swelling, and recent hospitalization. This will be obtained later this afternoon.   This plan was reviewed with the patient, who voices understanding and agreement.  She knows to call with any changes or problems.    Berel Najjar, PA-C 10/08/2011

## 2011-10-14 ENCOUNTER — Telehealth: Payer: Self-pay | Admitting: *Deleted

## 2011-10-15 ENCOUNTER — Ambulatory Visit (INDEPENDENT_AMBULATORY_CARE_PROVIDER_SITE_OTHER): Payer: Medicare Other

## 2011-10-15 DIAGNOSIS — D510 Vitamin B12 deficiency anemia due to intrinsic factor deficiency: Secondary | ICD-10-CM

## 2011-10-15 DIAGNOSIS — R5381 Other malaise: Secondary | ICD-10-CM

## 2011-10-15 NOTE — Telephone Encounter (Signed)
Error in opening

## 2011-10-24 ENCOUNTER — Telehealth: Payer: Self-pay | Admitting: *Deleted

## 2011-10-24 ENCOUNTER — Encounter: Payer: Self-pay | Admitting: *Deleted

## 2011-10-24 NOTE — Telephone Encounter (Signed)
This RN verified with pharmacy medication availability and form.  Per [facility] outpt pharmacy only form available at present is the 10ml multiple vial. Pt would also need script for syringes and needles for self administration.  This RN then called to pt to discuss above and need for appt for teaching ( dtr will be administering ).

## 2011-10-29 ENCOUNTER — Telehealth: Payer: Self-pay | Admitting: Internal Medicine

## 2011-10-29 NOTE — Telephone Encounter (Signed)
Line busy. Will try again later.

## 2011-10-30 ENCOUNTER — Telehealth: Payer: Self-pay | Admitting: *Deleted

## 2011-10-30 ENCOUNTER — Telehealth: Payer: Self-pay | Admitting: Internal Medicine

## 2011-10-30 DIAGNOSIS — D649 Anemia, unspecified: Secondary | ICD-10-CM

## 2011-10-30 DIAGNOSIS — E538 Deficiency of other specified B group vitamins: Secondary | ICD-10-CM

## 2011-10-30 NOTE — Telephone Encounter (Signed)
Dr. Juanda Chance scoped patient during recent hospitalization.  Would like clarification on who is managing patient's B12 deficiency.  Patient called [facility] stating she was to contact her PCP to receive B-12 injections.  [facility] noted an order from [facility] for B12.  Unable to obtain this iinformation today but will leave this request for Ms. Pollie Friar Charity fundraiser and Ms. Lanae Crumbly for clarification upon their return.  Also need clarification on form of B-12 (injections and/or oral B12).

## 2011-10-30 NOTE — Telephone Encounter (Signed)
Spoke with the triage nurse Roslind at Dr. Darrall Dears office. She is unsure if patient is to be followed by Dr. Darnelle Catalan for B 12 but she will discuss with his nurse when she returns next week. Patient is scheduled to have labs here and OV next week which she needs to keep to discuss her procedure results from in the hospital per Dr. Juanda Chance. Patient notified of above. She will keep the appointment and have labs on Friday.

## 2011-10-30 NOTE — Telephone Encounter (Signed)
Addended by: Daphine Deutscher on: 10/30/2011 02:40 PM   Modules accepted: Orders

## 2011-10-30 NOTE — Telephone Encounter (Signed)
Spoke with patient and she is asking about an rx for B 12. This rx was written by Zollie Scale, PA with Dr. Darnelle Catalan. Suggested she call them about the rx. She is not sure about who is following her B12 level. She does not remember speaking with Dr. Darrall Dears nurse about B 12 injections. She is asking if her PCP can do the labs and injections. Told patient I will discuss with Dr. Juanda Chance who should manage her B12. Also, left a voice mail with Dr. Darrall Dears nurse to call me.

## 2011-10-31 ENCOUNTER — Other Ambulatory Visit (INDEPENDENT_AMBULATORY_CARE_PROVIDER_SITE_OTHER): Payer: Medicare Other

## 2011-10-31 ENCOUNTER — Telehealth: Payer: Self-pay | Admitting: *Deleted

## 2011-10-31 ENCOUNTER — Ambulatory Visit: Payer: Medicare Other | Admitting: Family Medicine

## 2011-10-31 DIAGNOSIS — D649 Anemia, unspecified: Secondary | ICD-10-CM

## 2011-10-31 DIAGNOSIS — E538 Deficiency of other specified B group vitamins: Secondary | ICD-10-CM

## 2011-10-31 LAB — CBC WITH DIFFERENTIAL/PLATELET
Basophils Absolute: 0.1 10*3/uL (ref 0.0–0.1)
Basophils Relative: 1.1 % (ref 0.0–3.0)
Eosinophils Absolute: 0.4 10*3/uL (ref 0.0–0.7)
Eosinophils Relative: 6.1 % — ABNORMAL HIGH (ref 0.0–5.0)
HCT: 33.4 % — ABNORMAL LOW (ref 36.0–46.0)
Hemoglobin: 11.3 g/dL — ABNORMAL LOW (ref 12.0–15.0)
Lymphocytes Relative: 29.2 % (ref 12.0–46.0)
Lymphs Abs: 1.9 10*3/uL (ref 0.7–4.0)
MCHC: 33.7 g/dL (ref 30.0–36.0)
MCV: 99.1 fl (ref 78.0–100.0)
Monocytes Absolute: 0.4 10*3/uL (ref 0.1–1.0)
Monocytes Relative: 6.1 % (ref 3.0–12.0)
Neutro Abs: 3.7 10*3/uL (ref 1.4–7.7)
Neutrophils Relative %: 57.5 % (ref 43.0–77.0)
Platelets: 311 10*3/uL (ref 150.0–400.0)
RBC: 3.37 Mil/uL — ABNORMAL LOW (ref 3.87–5.11)
RDW: 15.1 % — ABNORMAL HIGH (ref 11.5–14.6)
WBC: 6.5 10*3/uL (ref 4.5–10.5)

## 2011-10-31 LAB — VITAMIN B12: Vitamin B-12: 416 pg/mL (ref 211–911)

## 2011-10-31 NOTE — Telephone Encounter (Signed)
Pt Needs to have a repeat EGD because she had a adenomatous gastric polyp with high grade Dysplasia.. Please arrange for me to see her in the office to discuss results of her EGD and plans for future B12 injections. ----- Message ----- From: Daphine Deutscher, RN Sent: 10/30/2011 2:40 PM To: Hart Carwin, MD Patient is scheduled for labs on 11/01/11 and OV on 11/05/11.

## 2011-11-01 ENCOUNTER — Telehealth: Payer: Self-pay | Admitting: *Deleted

## 2011-11-01 ENCOUNTER — Other Ambulatory Visit: Payer: Medicare Other

## 2011-11-01 NOTE — Telephone Encounter (Signed)
Line busy. Will try again later.

## 2011-11-01 NOTE — Telephone Encounter (Signed)
Left a message for patient to call me. 

## 2011-11-01 NOTE — Telephone Encounter (Signed)
Message copied by Daphine Deutscher on Fri Nov 01, 2011 11:06 AM ------      Message from: Hart Carwin      Created: Thu Oct 31, 2011  5:56 PM       Please call pt with much improvement of B12 level , blood count better but still low, will need repeat EGD, would do direct EGD unless she wants to discuss the polyp she had that was premalignant.

## 2011-11-01 NOTE — Telephone Encounter (Signed)
Spoke with patient and gave her results as per Dr. Juanda Chance. Patient has appointment already in computer for 11/05/11.

## 2011-11-04 ENCOUNTER — Telehealth: Payer: Self-pay | Admitting: *Deleted

## 2011-11-04 NOTE — Telephone Encounter (Signed)
Spoke with Dr. Darrall Dears nurse and he will be following the Vit B 12 def. And injections for patient.

## 2011-11-05 ENCOUNTER — Ambulatory Visit (INDEPENDENT_AMBULATORY_CARE_PROVIDER_SITE_OTHER): Payer: Medicare Other | Admitting: Internal Medicine

## 2011-11-05 ENCOUNTER — Encounter: Payer: Self-pay | Admitting: Internal Medicine

## 2011-11-05 VITALS — BP 128/64 | HR 72 | Ht 67.0 in | Wt 204.0 lb

## 2011-11-05 DIAGNOSIS — D518 Other vitamin B12 deficiency anemias: Secondary | ICD-10-CM

## 2011-11-05 NOTE — Progress Notes (Signed)
Kathryn Thomas [birth year] MRN [id number]   History of Present Illness:  This is a 71 year old white female with a new diagnosis of pernicious anemia. She had hemoccult-positive stool and a hemoglobin of 7.9 g during her recent hospitalization. Her B12 level was 45 and has risen to 416 recently. Her serum gastrin was elevated at 2002 consistent with pernicious anemia. Her MCV has come down from 130 to 99. Her hemoglobin is currently 11.3. She was found to have a giant gastric polyp and several other polyps in the stomach which were bleeding .The giant polyp had high-grade dysplasia and the rest of the stomach showed chronic active gastritis consistent with atrophic gastritis. She is feeling well, starting on monthly B12 shots given by her daughter. She will be followed for anemia by Dr.Magrinat. Her last colonoscopy in August 2010 was normal except for external hemorrhoids.    Past Medical History  Diagnosis Date  . Pernicious anemia   . Shortness of breath   . Blood transfusion     " no reaction to transfusion "  . Intestinal metaplasia of gastric mucosa 09/2011    high grade dysplasia  . Gastritis   . Vitamin B12 deficiency   . Acute renal failure   . Gastric polyp   . CVA (cerebral infarction)   . Glaucoma   . Anal stenosis 2010  . External hemorrhoids    Past Surgical History  Procedure Date  . Abdominal hysterectomy   . Back surgery 1980    three lower spine surgeries.  . Esophagogastroduodenoscopy 10/01/2011    Procedure: ESOPHAGOGASTRODUODENOSCOPY (EGD);  Surgeon: Hart Carwin, MD;  Location: [facility] ENDOSCOPY;  Service: Endoscopy;  Laterality: N/A;    reports that she has never smoked. She has never used smokeless tobacco. She reports that she does not drink alcohol or use illicit drugs. family history includes Cancer in her sister; Cervical cancer in her mother; Heart disease in her father and mother; Multiple myeloma in her sister; and Stroke in her sister.  There is no history  of Colon cancer. No Known Allergies      Review of Systems:Denies abdominal pain, dysphagia, odynophagia or heartburn  The remainder of the 10 point ROS is negative except as outlined in H&P   Physical Exam: General appearance  Well developed, in no distress.Marland Kitchen Psychological normal mood and affect.  Assessment and Plan:   Problem #1 Diagnosis of pernicious anemia, atrophic gastritis and premalignant gastric polyps. She will undergo a repeat upper endoscopy and residual polypectomies. She will have to be monitored closely for GI blood loss as well as for gastric neoplasm. She will be followed by Dr. Darnelle Catalan for B12 deficiency. Her anti-intrinsic factor antibodies and antiparietal cell antibodies are negative.   11/05/2011 Lina Sar

## 2011-11-05 NOTE — Patient Instructions (Addendum)
Please call back to set up your endoscopy (we did not set this up today as per your request to speak with your daughter first) and previsit to get endoscopy instructions. You may call [phone] to schedule. CC: Dr Ellamae Sia, Dr Darnelle Catalan

## 2011-11-06 ENCOUNTER — Encounter: Payer: Self-pay | Admitting: Internal Medicine

## 2011-11-08 ENCOUNTER — Ambulatory Visit: Payer: Medicare Other

## 2011-11-08 ENCOUNTER — Other Ambulatory Visit: Payer: Medicare Other

## 2011-11-21 ENCOUNTER — Ambulatory Visit (AMBULATORY_SURGERY_CENTER): Payer: Medicare Other | Admitting: *Deleted

## 2011-11-21 VITALS — Ht 67.0 in | Wt 204.7 lb

## 2011-11-21 DIAGNOSIS — D518 Other vitamin B12 deficiency anemias: Secondary | ICD-10-CM

## 2011-12-03 ENCOUNTER — Ambulatory Visit (AMBULATORY_SURGERY_CENTER): Payer: Medicare Other | Admitting: Internal Medicine

## 2011-12-03 ENCOUNTER — Encounter: Payer: Self-pay | Admitting: Internal Medicine

## 2011-12-03 DIAGNOSIS — D649 Anemia, unspecified: Secondary | ICD-10-CM

## 2011-12-03 DIAGNOSIS — D519 Vitamin B12 deficiency anemia, unspecified: Secondary | ICD-10-CM

## 2011-12-03 DIAGNOSIS — K317 Polyp of stomach and duodenum: Secondary | ICD-10-CM

## 2011-12-03 DIAGNOSIS — D131 Benign neoplasm of stomach: Secondary | ICD-10-CM

## 2011-12-03 DIAGNOSIS — R55 Syncope and collapse: Secondary | ICD-10-CM

## 2011-12-03 DIAGNOSIS — D518 Other vitamin B12 deficiency anemias: Secondary | ICD-10-CM

## 2011-12-03 DIAGNOSIS — K319 Disease of stomach and duodenum, unspecified: Secondary | ICD-10-CM

## 2011-12-03 MED ORDER — SODIUM CHLORIDE 0.9 % IV SOLN: 500.0000 mL | INTRAVENOUS | Status: DC

## 2011-12-03 NOTE — Patient Instructions (Addendum)

## 2011-12-03 NOTE — Op Note (Signed)
[facility] [address] [place], [place]  [address]  ENDOSCOPY PROCEDURE REPORT  PATIENT:  Kathryn Thomas, Kathryn Thomas  MR#:  [id number] BIRTHDATE:  11/14/1940, 70 yrs. old  GENDER:  female  ENDOSCOPIST:  Hedwig Morton. Juanda Chance, MD Referred by:  Ruthann Cancer, M.D.  PROCEDURE DATE:  12/03/2011 PROCEDURE:  EGD w/ polyp removal ASA CLASS:  Class II INDICATIONS:  iron deficiency anemia dysolastic polyp removed 09/2011, new diagnosis of Pernicious anemia, this is a follow up EGD  MEDICATIONS:   MAC sedation, administered by CRNA, propofol (Diprivan) 300 mg TOPICAL ANESTHETIC:  none  DESCRIPTION OF PROCEDURE:   After the risks benefits and alternatives of the procedure were thoroughly explained, informed consent was obtained.  The LB GIF-H180 [id number] endoscope was introduced through the mouth and advanced to the second portion of the duodenum, without limitations.  The instrument was slowly withdrawn as the mucosa was fully examined. <<PROCEDUREIMAGES>>  There were multiple polyps identified. several 3 mm polyps gastric body- removed with Bx and snare i larger polyp in the fundus removed,likely remnant of the previous polyp from 09/2011 Polyp was snared, then cauterized with monopolar cautery. Polyp was retrieved and sent to pathology (see image3, image4, and image5).  Otherwise the examination was normal (see image2 and image1).    Retroflexed views revealed no abnormalities.    The scope was then withdrawn from the patient and the procedure completed.  COMPLICATIONS:  None  ENDOSCOPIC IMPRESSION: 1) Polyps, multiple 2) Otherwise normal examination s/p multiple polypectomies gastric body and fundus, no active bleeding observed RECOMMENDATIONS: 1) Await pathology results  REPEAT EXAM:  In 1 year(s) for.  ______________________________ Hedwig Morton. Juanda Chance, MD  CC:  n. eSIGNED:   Hedwig Morton. Yulia Ulrich at 12/03/2011 11:35 AM  Murvin Donning, [phone]

## 2011-12-03 NOTE — Progress Notes (Signed)
Patient did not experience any of the following events: a burn prior to discharge; a fall within the facility; wrong site/side/patient/procedure/implant event; or a hospital transfer or hospital admission upon discharge from the facility. (G8907) Patient did not have preoperative order for IV antibiotic SSI prophylaxis. (G8918)  

## 2011-12-04 ENCOUNTER — Telehealth: Payer: Self-pay | Admitting: *Deleted

## 2011-12-04 NOTE — Telephone Encounter (Signed)
  Follow up Call-  Call back number 12/03/2011  Post procedure Call Back phone  # cell [phone]  Permission to leave phone message Yes     Patient questions:  Do you have a fever, pain , or abdominal swelling? no Pain Score  0 *  Have you tolerated food without any problems? yes  Have you been able to return to your normal activities? yes  Do you have any questions about your discharge instructions: Diet   no Medications  no Follow up visit  no  Do you have questions or concerns about your Care? no  Actions: * If pain score is 4 or above: No action needed, pain <4.

## 2011-12-10 ENCOUNTER — Encounter: Payer: Self-pay | Admitting: Internal Medicine

## 2012-01-06 ENCOUNTER — Other Ambulatory Visit: Payer: Medicare Other | Admitting: Lab

## 2012-01-22 ENCOUNTER — Other Ambulatory Visit: Payer: Self-pay | Admitting: Internal Medicine

## 2012-02-21 ENCOUNTER — Telehealth: Payer: Self-pay | Admitting: Oncology

## 2012-02-21 NOTE — Telephone Encounter (Signed)
S/w the pt regarding her aug appts had to be r/s due to the md's schedule on 05/07/2012. The pt stated that she has never seen dr Darnelle Catalan before and i may hAVE THE  Wrong pt. Advised the pt that she has been scheduled a while back in [place]. Pt has no idea what i am talking about

## 2012-03-30 ENCOUNTER — Ambulatory Visit (INDEPENDENT_AMBULATORY_CARE_PROVIDER_SITE_OTHER): Payer: Medicare Other | Admitting: Family Medicine

## 2012-03-30 VITALS — BP 142/84 | HR 84 | Temp 98.6°F | Resp 18 | Ht 66.0 in | Wt 223.0 lb

## 2012-03-30 DIAGNOSIS — E559 Vitamin D deficiency, unspecified: Secondary | ICD-10-CM

## 2012-03-30 DIAGNOSIS — F32A Depression, unspecified: Secondary | ICD-10-CM

## 2012-03-30 DIAGNOSIS — R5381 Other malaise: Secondary | ICD-10-CM

## 2012-03-30 DIAGNOSIS — R5383 Other fatigue: Secondary | ICD-10-CM

## 2012-03-30 DIAGNOSIS — E538 Deficiency of other specified B group vitamins: Secondary | ICD-10-CM

## 2012-03-30 DIAGNOSIS — F329 Major depressive disorder, single episode, unspecified: Secondary | ICD-10-CM

## 2012-03-30 LAB — TSH: TSH: 2.98 u[IU]/mL (ref 0.350–4.500)

## 2012-03-30 LAB — POCT CBC
Granulocyte percent: 64.6 %G (ref 37–80)
HCT, POC: 47.8 % (ref 37.7–47.9)
Hemoglobin: 14.8 g/dL (ref 12.2–16.2)
Lymph, poc: 2.9 (ref 0.6–3.4)
MCH, POC: 27.9 pg (ref 27–31.2)
MCHC: 31 g/dL — AB (ref 31.8–35.4)
MCV: 90.1 fL (ref 80–97)
MID (cbc): 0.6 (ref 0–0.9)
MPV: 9.2 fL (ref 0–99.8)
POC Granulocyte: 6.3 (ref 2–6.9)
POC LYMPH PERCENT: 29.4 %L (ref 10–50)
POC MID %: 6 %M (ref 0–12)
Platelet Count, POC: 439 10*3/uL — AB (ref 142–424)
RBC: 5.3 M/uL (ref 4.04–5.48)
RDW, POC: 14.9 %
WBC: 9.8 10*3/uL (ref 4.6–10.2)

## 2012-03-30 LAB — VITAMIN B12: Vitamin B-12: 486 pg/mL (ref 211–911)

## 2012-03-30 NOTE — Progress Notes (Addendum)
Patient Name: Kathryn Thomas Date of Birth: [birth year] Medical Record Number: [id number] Gender: female Date of Encounter: 03/30/2012  Chief Complaint: Fatigue, Shortness of Breath and Depression   History of Present Illness:  Kathryn Thomas is a 71 y.o. very pleasant female patient who presents with the following:  Last seen here in January of this year after hospital admission for severe anemia (Hg of 6).  Her sister died in 2023-02-25 of this year which has been very hard for [employer].  This sister was more like a mother to her- they were very close.  Since her sister's death she has felt very low energy and "very sad."  She is still taking a B12 shot monthly, and weekly vit D supplement.  Most recent labs done in February.   "I cry all the time."  Sleep is "interrrupted."  Feels isolated and too tired to be involved with others.  She does have 2 daughters and grandchildren who live nearby, and they spend time together.  She has used some xanax in the past, but has not really dealt with depression before.  She has also noted some weight gain- however she had lost weight before her hospitalization. She denies any SI.   Patient Active Problem List  Diagnosis  . Anemia  . Weight loss  . Positional lightheadedness  . Acute renal failure  . UTI (lower urinary tract infection)  . Pernicious anemia  . Multiple gastric polyps  . B12 deficiency anemia   Past Medical History  Diagnosis Date  . Pernicious anemia   . Shortness of breath   . Blood transfusion     " no reaction to transfusion "  . Intestinal metaplasia of gastric mucosa 09/2011    high grade dysplasia  . Gastritis   . Vitamin B12 deficiency   . Acute renal failure   . Gastric polyp   . CVA (cerebral infarction)   . Glaucoma   . Anal stenosis 02-24-09  . External hemorrhoids    Past Surgical History  Procedure Date  . Abdominal hysterectomy   . Back surgery 1980    three lower spine surgeries.  .  Esophagogastroduodenoscopy 10/01/2011    Procedure: ESOPHAGOGASTRODUODENOSCOPY (EGD);  Surgeon: Hart Carwin, MD;  Location: [facility] ENDOSCOPY;  Service: Endoscopy;  Laterality: N/A;   History  Substance Use Topics  . Smoking status: Never Smoker   . Smokeless tobacco: Never Used  . Alcohol Use: No   Family History  Problem Relation Age of Onset  . Cervical cancer Mother   . Heart disease Father   . Multiple myeloma Sister     spleen involvement  . Cancer Sister     bone marrow  . Stroke Sister   . Heart disease Mother   . Colon cancer Neg Hx    No Known Allergies  Medication list has been reviewed and updated.  Current Outpatient Prescriptions on File Prior to Visit  Medication Sig Dispense Refill  . amLODipine (NORVASC) 5 MG tablet Take 5 mg by mouth daily.      . cyanocobalamin (,VITAMIN B-12,) 1000 MCG/ML injection Inject 1,000 mcg into the muscle every 30 (thirty) days.      . Vitamin D, Ergocalciferol, (DRISDOL) 50000 UNITS CAPS TAKE ONE CAPSULE BY MOUTH ONCE A WEEK  12 capsule  0    Review of Systems:  As per HPI- otherwise negative.  Physical Examination: Filed Vitals:   03/30/12 1044  BP: 142/84  Pulse: 84  Temp: 98.6  F (37 C)  Resp: 18   Filed Vitals:   03/30/12 1044  Height: 5\' 6"  (1.676 m)  Weight: 223 lb (101.152 kg)   Body mass index is 35.99 kg/(m^2). Ideal Body Weight: Weight in (lb) to have BMI = 25: 154.6   GEN: WDWN, NAD, Non-toxic, A & O x 3, obese, tearful at times.  HEENT: Atraumatic, Normocephalic. Neck supple. No masses, No LAD. Ears and Nose: No external deformity. CV: RRR, No M/G/R. No JVD. No thrill. No extra heart sounds. PULM: CTA B, no wheezes, crackles, rhonchi. No retractions. No resp. distress. No accessory muscle use. ABD: S, NT, ND, +BS. No rebound. No HSM. EXTR: No c/c/e NEURO Normal gait.  PSYCH: Normally interactive, but cries easily and seems depressed  Results for orders placed in visit on 03/30/12  POCT CBC       Component Value Range   WBC 9.8  4.6 - 10.2 K/uL   Lymph, poc 2.9  0.6 - 3.4   POC LYMPH PERCENT 29.4  10 - 50 %L   MID (cbc) 0.6  0 - 0.9   POC MID % 6.0  0 - 12 %M   POC Granulocyte 6.3  2 - 6.9   Granulocyte percent 64.6  37 - 80 %G   RBC 5.30  4.04 - 5.48 M/uL   Hemoglobin 14.8  12.2 - 16.2 g/dL   HCT, POC 16.1  09.6 - 47.9 %   MCV 90.1  80 - 97 fL   MCH, POC 27.9  27 - 31.2 pg   MCHC 31.0 (*) 31.8 - 35.4 g/dL   RDW, POC 04.5     Platelet Count, POC 439 (*) 142 - 424 K/uL   MPV 9.2  0 - 99.8 fL    Assessment and Plan: 1. Depression    2. Vitamin B 12 deficiency  TSH, Vitamin B12, Vitamin D, 25-hydroxy  3. Fatigue  TSH, Vitamin B12, Vitamin D, 25-hydroxy, POCT CBC  4. Vitamin d deficiency  TSH, Vitamin B12, Vitamin D, 25-hydroxy   Suspect depression, but will check other labs as above.  Her anemia has resolved.  Unless another physical cause for her symptoms is found plan to start her on wellbutrin.  She is ok with this plan.  Also encouraged physical and social activity, and good sleep and eating habits.  Will plan further follow- up pending labs.   Renlee Floor, MD  03/31/12- Called and went over labs Results for orders placed in visit on 03/30/12  TSH      Component Value Range   TSH 2.980  0.350 - 4.500 uIU/mL  VITAMIN B12      Component Value Range   Vitamin B-12 486  211 - 911 pg/mL  VITAMIN D 25 HYDROXY      Component Value Range   Vit D, 25-Hydroxy 39  30 - 89 ng/mL  POCT CBC      Component Value Range   WBC 9.8  4.6 - 10.2 K/uL   Lymph, poc 2.9  0.6 - 3.4   POC LYMPH PERCENT 29.4  10 - 50 %L   MID (cbc) 0.6  0 - 0.9   POC MID % 6.0  0 - 12 %M   POC Granulocyte 6.3  2 - 6.9   Granulocyte percent 64.6  37 - 80 %G   RBC 5.30  4.04 - 5.48 M/uL   Hemoglobin 14.8  12.2 - 16.2 g/dL   HCT, POC 40.9  81.1 - 47.9 %  MCV 90.1  80 - 97 fL   MCH, POC 27.9  27 - 31.2 pg   MCHC 31.0 (*) 31.8 - 35.4 g/dL   RDW, POC 45.4     Platelet Count, POC 439 (*) 142 - 424  K/uL   MPV 9.2  0 - 99.8 fL   No physical cause of her mood symptoms apparent.  Will start wellbutrin 150 twice daily, start with one daily for 3 days first.  She also mentions that she gets very fatigued with exercise- however this is not new.  Her sleep is poor- ?OSA.  I asked her to come back and see me in the next week or so and we can look at this further.

## 2012-03-31 LAB — VITAMIN D 25 HYDROXY (VIT D DEFICIENCY, FRACTURES): Vit D, 25-Hydroxy: 39 ng/mL (ref 30–89)

## 2012-03-31 MED ORDER — BUPROPION HCL ER (SR) 150 MG PO TB12: 150.0000 mg | ORAL_TABLET | Freq: Two times a day (BID) | ORAL | Status: DC

## 2012-03-31 NOTE — Addendum Note (Signed)
Addended by: Abbe Amsterdam C on: 03/31/2012 05:28 PM   Modules accepted: Orders

## 2012-04-02 ENCOUNTER — Other Ambulatory Visit: Payer: Self-pay | Admitting: *Deleted

## 2012-04-03 ENCOUNTER — Other Ambulatory Visit: Payer: Self-pay

## 2012-04-03 MED ORDER — AMLODIPINE BESYLATE 5 MG PO TABS: 5.0000 mg | ORAL_TABLET | Freq: Every day | ORAL | Status: DC

## 2012-05-07 ENCOUNTER — Ambulatory Visit: Payer: Medicare Other | Admitting: Oncology

## 2012-05-07 ENCOUNTER — Other Ambulatory Visit: Payer: Medicare Other | Admitting: Lab

## 2012-05-12 ENCOUNTER — Ambulatory Visit: Payer: Medicare Other | Admitting: Oncology

## 2012-05-12 ENCOUNTER — Other Ambulatory Visit: Payer: Medicare Other | Admitting: Lab

## 2012-06-02 ENCOUNTER — Other Ambulatory Visit: Payer: Self-pay | Admitting: Internal Medicine

## 2012-06-02 DIAGNOSIS — F419 Anxiety disorder, unspecified: Secondary | ICD-10-CM

## 2012-06-02 NOTE — Telephone Encounter (Signed)
Dr. Patsy Lager,   Did you want to continue patient on this?

## 2012-06-03 MED ORDER — ALPRAZOLAM 1 MG PO TABS: 1.0000 mg | ORAL_TABLET | Freq: Every evening | ORAL | Status: DC | PRN

## 2012-06-03 MED ORDER — ALPRAZOLAM 0.25 MG PO TABS: 0.2500 mg | ORAL_TABLET | Freq: Two times a day (BID) | ORAL | Status: DC | PRN

## 2012-06-03 NOTE — Telephone Encounter (Signed)
Patient called to inquire about vitamin D refill, told her pharmacy already sent it and we are in the process of authorizing it. She would also like xanex refill (1mg ).  Best [phone]

## 2012-06-03 NOTE — Telephone Encounter (Signed)
Did some investigation as I did not think we actually wrote xanax for her.  Indeed, she received an Rx for Ativan from another doctor at [facility] in January of this year, and for xanax in 2012 from a doctor in [place].  However, we will give her a supply of xanax to use during this difficult time as she has used it before with success.  Will give a very low dose- 0.25 mg.  She has complaint of feeling nervous and upset due to all the illness in her family

## 2012-06-03 NOTE — Addendum Note (Signed)
Addended by: Abbe Amsterdam C on: 06/03/2012 05:12 PM   Modules accepted: Orders

## 2012-07-01 ENCOUNTER — Other Ambulatory Visit: Payer: Self-pay | Admitting: Family Medicine

## 2012-07-02 ENCOUNTER — Other Ambulatory Visit: Payer: Self-pay | Admitting: *Deleted

## 2012-07-16 ENCOUNTER — Other Ambulatory Visit: Payer: Self-pay | Admitting: Family Medicine

## 2012-07-20 ENCOUNTER — Telehealth: Payer: Self-pay

## 2012-07-20 NOTE — Telephone Encounter (Signed)
The patient called to request rx for B12 shot be called in to her pharmacy today, as she has another rx to pick up there and does not want to make two trips.  Please call the patient once this has been called into the pharmacy at [phone].

## 2012-07-21 MED ORDER — CYANOCOBALAMIN 1000 MCG/ML IJ SOLN: 1000.0000 ug | INTRAMUSCULAR | Status: DC

## 2012-07-21 NOTE — Telephone Encounter (Signed)
Spoke with pt advised Rx sent to pharmacy. 

## 2012-07-21 NOTE — Telephone Encounter (Signed)
Sent to pharmacy 

## 2012-08-18 ENCOUNTER — Telehealth: Payer: Self-pay | Admitting: Radiology

## 2012-08-18 NOTE — Telephone Encounter (Signed)
Pharmacy requesting RF on Xanax 0.25mg , last disp 06/1012.  Does not look like we originally prescribed this for her, but it looks like Dr. Patsy Lager did fill this for her.  Dr. Patsy Lager please advise

## 2012-08-19 ENCOUNTER — Telehealth: Payer: Self-pay

## 2012-08-19 MED ORDER — ALPRAZOLAM 0.25 MG PO TABS: 0.2500 mg | ORAL_TABLET | Freq: Two times a day (BID) | ORAL | Status: DC | PRN

## 2012-08-19 NOTE — Telephone Encounter (Signed)
PT STATES SHE WOULD LIKE THE DR TO CALL IN SOME ALPRAZOLAM 0.25 MGS. PLEASE CALL PT AT [phone]    [facility] [address]

## 2012-08-19 NOTE — Telephone Encounter (Signed)
Called and talked with her.  She has used xanax for some time- this was just not listed on her last OV.   She does not take it every day, but does use it 3x a week on average.   She will come and see me in the next month or two.

## 2012-09-14 ENCOUNTER — Other Ambulatory Visit: Payer: Self-pay | Admitting: Family Medicine

## 2012-10-09 ENCOUNTER — Other Ambulatory Visit: Payer: Self-pay | Admitting: Family Medicine

## 2012-10-09 ENCOUNTER — Ambulatory Visit: Payer: Medicare Other

## 2012-10-12 ENCOUNTER — Ambulatory Visit (INDEPENDENT_AMBULATORY_CARE_PROVIDER_SITE_OTHER): Payer: Medicare Other | Admitting: Family Medicine

## 2012-10-12 VITALS — BP 159/80 | HR 81 | Temp 98.0°F | Resp 16 | Ht 67.0 in | Wt 252.0 lb

## 2012-10-12 DIAGNOSIS — Z23 Encounter for immunization: Secondary | ICD-10-CM

## 2012-10-12 DIAGNOSIS — F411 Generalized anxiety disorder: Secondary | ICD-10-CM

## 2012-10-12 DIAGNOSIS — E538 Deficiency of other specified B group vitamins: Secondary | ICD-10-CM

## 2012-10-12 DIAGNOSIS — R635 Abnormal weight gain: Secondary | ICD-10-CM

## 2012-10-12 DIAGNOSIS — R32 Unspecified urinary incontinence: Secondary | ICD-10-CM

## 2012-10-12 DIAGNOSIS — F419 Anxiety disorder, unspecified: Secondary | ICD-10-CM

## 2012-10-12 DIAGNOSIS — M25473 Effusion, unspecified ankle: Secondary | ICD-10-CM

## 2012-10-12 DIAGNOSIS — M25476 Effusion, unspecified foot: Secondary | ICD-10-CM

## 2012-10-12 LAB — POCT CBC
Granulocyte percent: 63.2 %G (ref 37–80)
HCT, POC: 46.5 % (ref 37.7–47.9)
Hemoglobin: 14.7 g/dL (ref 12.2–16.2)
Lymph, poc: 2.7 (ref 0.6–3.4)
MCH, POC: 27.7 pg (ref 27–31.2)
MCHC: 31.6 g/dL — AB (ref 31.8–35.4)
MCV: 87.8 fL (ref 80–97)
MID (cbc): 0.5 (ref 0–0.9)
MPV: 9.6 fL (ref 0–99.8)
POC Granulocyte: 5.5 (ref 2–6.9)
POC LYMPH PERCENT: 30.6 %L (ref 10–50)
POC MID %: 6.2 %M (ref 0–12)
Platelet Count, POC: 385 10*3/uL (ref 142–424)
RBC: 5.3 M/uL (ref 4.04–5.48)
RDW, POC: 14.4 %
WBC: 8.7 10*3/uL (ref 4.6–10.2)

## 2012-10-12 LAB — COMPREHENSIVE METABOLIC PANEL
ALT: 11 U/L (ref 0–35)
AST: 13 U/L (ref 0–37)
Albumin: 4.2 g/dL (ref 3.5–5.2)
Alkaline Phosphatase: 102 U/L (ref 39–117)
BUN: 20 mg/dL (ref 6–23)
CO2: 26 mEq/L (ref 19–32)
Calcium: 9.2 mg/dL (ref 8.4–10.5)
Chloride: 104 mEq/L (ref 96–112)
Creat: 0.96 mg/dL (ref 0.50–1.10)
Glucose, Bld: 123 mg/dL — ABNORMAL HIGH (ref 70–99)
Potassium: 4 mEq/L (ref 3.5–5.3)
Sodium: 139 mEq/L (ref 135–145)
Total Bilirubin: 0.3 mg/dL (ref 0.3–1.2)
Total Protein: 7.4 g/dL (ref 6.0–8.3)

## 2012-10-12 LAB — POCT URINALYSIS DIPSTICK
Bilirubin, UA: NEGATIVE
Glucose, UA: NEGATIVE
Ketones, UA: NEGATIVE
Leukocytes, UA: NEGATIVE
Nitrite, UA: NEGATIVE
Protein, UA: 100
Spec Grav, UA: >=1.03
Urobilinogen, UA: 1
pH, UA: 5

## 2012-10-12 LAB — POCT UA - MICROSCOPIC ONLY
Casts, Ur, LPF, POC: NEGATIVE
Crystals, Ur, HPF, POC: NEGATIVE
Mucus, UA: POSITIVE
Yeast, UA: NEGATIVE

## 2012-10-12 LAB — TSH: TSH: 2.931 u[IU]/mL (ref 0.350–4.500)

## 2012-10-12 LAB — VITAMIN B12: Vitamin B-12: 235 pg/mL (ref 211–911)

## 2012-10-12 MED ORDER — CEPHALEXIN 500 MG PO CAPS: 500.0000 mg | ORAL_CAPSULE | Freq: Two times a day (BID) | ORAL | Status: DC

## 2012-10-12 MED ORDER — CYANOCOBALAMIN 1000 MCG/ML IJ SOLN
1000.0000 ug | Freq: Once | INTRAMUSCULAR | Status: AC
  Administered 2012-10-12: 1000 ug via INTRAMUSCULAR

## 2012-10-12 NOTE — Patient Instructions (Addendum)
I will be in touch regarding your labs when they come in- should be just a couple of days.    Please think about doing some more walking/ exercise to help with your weight and blood pressure.    We are going to use keflex for a possible bladder infection.  Take the keflex twice a day for one week

## 2012-10-12 NOTE — Progress Notes (Signed)
[facility] [address], [place] [address] [phone]  Date:  10/12/2012   Name:  Kathryn Thomas   DOB:  [birth year]   MRN:  [id number]  PCP:  Tonye Pearson, MD    Chief Complaint: Immunizations   History of Present Illness:  Kathryn Thomas is a 72 y.o. very pleasant female patient who presents with the following:  She is here today to check up on some issues.   She would like a flu shot today, and would like to have a B12 shot.  She has done this once a month for the past year.  She was told she would need to do this "for the rest of my life."  Generally she has these done at home (her daughter gives them too her) but she would like for [place] to do this as long as she is here today.   She is also concerned about ankle swelling.  She has noted this for about 6 weeks.  Her ankles are better in the morning when she wakes up.  She has trouble fitting into her shoes due to swelling.   She uses 2 pillows when she sleeps, this is baseline.  She is not SOB.    She also notes problems with stress incontinence- this has been present for present for some time but worse for a month.  She does not have any dyruria.  She does have urinary frequency.  She has to wear a pad when she goes out of the house.  No saddle anesthesia, she is s/p hysterectomy.    She also notes weight gain.  She does not think her diet has changed.   She has indeed gained about 20 lbs since her visit over the summer.  Last TSH was done in July and looked ok.     She is also concerned about her xanax rx- she would like to have more in an rx.  She states that she takes one every other day or so- sometimes two in a day.    She was given phentermine in the past- she last took it about one year ago.  It helped her to lose weight and she would like to have some more if possible   She had a pneumovax this time last year.    She is not fasting today.  She does not exercise and is not interested  in starting She has had a hysterectomy- this was done when she was about 72 years old.  Done due to heavy menstrual bleeding.    Patient Active Problem List  Diagnosis  . Anemia  . Weight loss  . Positional lightheadedness  . Acute renal failure  . UTI (lower urinary tract infection)  . Pernicious anemia  . Multiple gastric polyps  . B12 deficiency anemia    Past Medical History  Diagnosis Date  . Pernicious anemia   . Shortness of breath   . Blood transfusion     " no reaction to transfusion "  . Intestinal metaplasia of gastric mucosa 09/2011    high grade dysplasia  . Gastritis   . Vitamin B12 deficiency   . Acute renal failure   . Gastric polyp   . CVA (cerebral infarction)   . Glaucoma(365)   . Anal stenosis 2010  . External hemorrhoids   . Anxiety   . Depression     Past Surgical History  Procedure Date  . Abdominal hysterectomy   .  Back surgery 1980    three lower spine surgeries.  . Esophagogastroduodenoscopy 10/01/2011    Procedure: ESOPHAGOGASTRODUODENOSCOPY (EGD);  Surgeon: Hart Carwin, MD;  Location: [facility] ENDOSCOPY;  Service: Endoscopy;  Laterality: N/A;    History  Substance Use Topics  . Smoking status: Never Smoker   . Smokeless tobacco: Never Used  . Alcohol Use: No    Family History  Problem Relation Age of Onset  . Cervical cancer Mother   . Heart disease Father   . Multiple myeloma Sister     spleen involvement  . Cancer Sister     bone marrow  . Stroke Sister   . Heart disease Mother   . Colon cancer Neg Hx     No Known Allergies  Medication list has been reviewed and updated.  Current Outpatient Prescriptions on File Prior to Visit  Medication Sig Dispense Refill  . ALPRAZolam (XANAX) 0.25 MG tablet Take 1 tablet (0.25 mg total) by mouth 2 (two) times daily as needed for sleep or anxiety. Office visit needed prior to next refill  20 tablet  0  . amLODipine (NORVASC) 5 MG tablet Take 1 tablet (5 mg total) by mouth daily.  30  tablet  6  . buPROPion (WELLBUTRIN SR) 150 MG 12 hr tablet TAKE ONE TABLET BY MOUTH EVERY DAY FOR 3 DAYS THEN INCREASE TO ONE TABLET BY MOUTH TWICE DAILY  60 tablet  3  . cyanocobalamin (,VITAMIN B-12,) 1000 MCG/ML injection Inject 1 mL (1,000 mcg total) into the muscle every 30 (thirty) days.  1 mL  0  . Vitamin D, Ergocalciferol, (DRISDOL) 50000 UNITS CAPS TAKE ONE CAPSULE BY MOUTH ONCE A WEEK  12 capsule  0    Review of Systems:  As per HPI- otherwise negative.   Physical Examination: Filed Vitals:   10/12/12 0900  BP: 159/80  Pulse: 81  Temp: 98 F (36.7 C)  Resp: 16   Filed Vitals:   10/12/12 0900  Height: 5\' 7"  (1.702 m)  Weight: 252 lb (114.306 kg)   Body mass index is 39.47 kg/(m^2). Ideal Body Weight: Weight in (lb) to have BMI = 25: 159.3   GEN: WDWN, NAD, Non-toxic, A & O x 3, obese HEENT: Atraumatic, Normocephalic. Neck supple. No masses, No LAD. Bilateral TM wnl, oropharynx normal.  PEERL,EOMI.   Ears and Nose: No external deformity. CV: RRR, No M/G/R. No JVD. No thrill. No extra heart sounds. PULM: CTA B, no wheezes, crackles, rhonchi. No retractions. No resp. distress. No accessory muscle use. ABD: S, NT, ND, +BS. No rebound. No HSM. No CVA tenderness.  EXTR: No c/c. Trace edema to LE bilaterally  NEURO Normal gait.  PSYCH: Normally interactive. Conversant. Not depressed or anxious appearing.  Calm demeanor.    Results for orders placed in visit on 10/12/12  POCT CBC      Component Value Range   WBC 8.7  4.6 - 10.2 K/uL   Lymph, poc 2.7  0.6 - 3.4   POC LYMPH PERCENT 30.6  10 - 50 %L   MID (cbc) 0.5  0 - 0.9   POC MID % 6.2  0 - 12 %M   POC Granulocyte 5.5  2 - 6.9   Granulocyte percent 63.2  37 - 80 %G   RBC 5.30  4.04 - 5.48 M/uL   Hemoglobin 14.7  12.2 - 16.2 g/dL   HCT, POC 16.1  09.6 - 47.9 %   MCV 87.8  80 - 97 fL  MCH, POC 27.7  27 - 31.2 pg   MCHC 31.6 (*) 31.8 - 35.4 g/dL   RDW, POC 84.1     Platelet Count, POC 385  142 - 424 K/uL    MPV 9.6  0 - 99.8 fL  POCT UA - MICROSCOPIC ONLY      Component Value Range   WBC, Ur, HPF, POC 3-5     RBC, urine, microscopic 5-6     Bacteria, U Microscopic 2+     Mucus, UA positive     Epithelial cells, urine per micros 4-6     Crystals, Ur, HPF, POC neg     Casts, Ur, LPF, POC neg     Yeast, UA neg    POCT URINALYSIS DIPSTICK      Component Value Range   Color, UA yellow     Clarity, UA clear     Glucose, UA neg     Bilirubin, UA neg     Ketones, UA neg     Spec Grav, UA >=1.030     Blood, UA large     pH, UA 5.0     Protein, UA 100     Urobilinogen, UA 1.0     Nitrite, UA neg     Leukocytes, UA Negative      Assessment and Plan: 1. Urinary incontinence  POCT UA - Microscopic Only, POCT urinalysis dipstick, Urine culture, cephALEXin (KEFLEX) 500 MG capsule  2. Weight gain  TSH  3. Ankle swelling  POCT CBC, Comprehensive metabolic panel  4. B12 deficiency  cyanocobalamin ((VITAMIN B-12)) injection 1,000 mcg, Vitamin B12  5. Anxiety     Possible UTI related urinary incontinence.  Encouraged her to try kegels and educated about how to do kegel exercises.  rx keflex while we await her urine culture.  She could likely also use detrol.    Check TSH.  Let her know I will probably not want to rx phentermine given her age and BP  Assuming her CMP is normal we can start HCTZ for her ankle swelling.  Gave B12 shot today, check level  She recently refilled her xanax- I wrote her for #20 3 days ago,  She can have #30 next time  Will plan further follow- up pending labs.   Abbe Amsterdam, MD

## 2012-10-14 LAB — URINE CULTURE
Colony Count: NO GROWTH
Organism ID, Bacteria: NO GROWTH

## 2012-10-16 ENCOUNTER — Encounter: Payer: Self-pay | Admitting: Family Medicine

## 2012-10-16 NOTE — Addendum Note (Signed)
Addended by: Abbe Amsterdam C on: 10/16/2012 01:53 PM   Modules accepted: Orders

## 2012-10-23 ENCOUNTER — Encounter: Payer: Self-pay | Admitting: Internal Medicine

## 2012-10-27 ENCOUNTER — Telehealth: Payer: Self-pay

## 2012-10-27 NOTE — Telephone Encounter (Signed)
Left message for patient to call back  

## 2012-11-17 ENCOUNTER — Telehealth: Payer: Self-pay | Admitting: Radiology

## 2012-11-17 NOTE — Telephone Encounter (Signed)
Patient has called back, was advised her urine was not analyzed. If she is still symptomatic she will call back.

## 2012-11-19 NOTE — Telephone Encounter (Signed)
Patient called and advised we were unable to perform urine sample.  No orders and specimen not in sterile container. Patient is doing well and will return to clinic if needed.

## 2012-11-25 ENCOUNTER — Encounter: Payer: Self-pay | Admitting: Internal Medicine

## 2012-12-14 ENCOUNTER — Ambulatory Visit (AMBULATORY_SURGERY_CENTER): Payer: Medicare Other

## 2012-12-14 VITALS — Ht 67.0 in | Wt 250.0 lb

## 2012-12-14 DIAGNOSIS — K317 Polyp of stomach and duodenum: Secondary | ICD-10-CM

## 2012-12-14 DIAGNOSIS — D131 Benign neoplasm of stomach: Secondary | ICD-10-CM

## 2012-12-16 ENCOUNTER — Other Ambulatory Visit: Payer: Self-pay | Admitting: Family Medicine

## 2012-12-16 DIAGNOSIS — F411 Generalized anxiety disorder: Secondary | ICD-10-CM

## 2012-12-30 ENCOUNTER — Ambulatory Visit (AMBULATORY_SURGERY_CENTER): Payer: Medicare Other | Admitting: Internal Medicine

## 2012-12-30 ENCOUNTER — Encounter: Payer: Self-pay | Admitting: Internal Medicine

## 2012-12-30 VITALS — BP 126/67 | HR 69 | Temp 98.4°F | Resp 18 | Ht 67.0 in | Wt 250.0 lb

## 2012-12-30 DIAGNOSIS — D131 Benign neoplasm of stomach: Secondary | ICD-10-CM

## 2012-12-30 DIAGNOSIS — K2940 Chronic atrophic gastritis without bleeding: Secondary | ICD-10-CM

## 2012-12-30 DIAGNOSIS — K317 Polyp of stomach and duodenum: Secondary | ICD-10-CM

## 2012-12-30 DIAGNOSIS — D510 Vitamin B12 deficiency anemia due to intrinsic factor deficiency: Secondary | ICD-10-CM

## 2012-12-30 MED ORDER — SODIUM CHLORIDE 0.9 % IV SOLN: 500.0000 mL | INTRAVENOUS | Status: DC

## 2012-12-30 NOTE — Op Note (Signed)
[facility] [address] [place], [address]   ENDOSCOPY PROCEDURE REPORT  PATIENT: Kathryn, Thomas  MR#: [id number] BIRTHDATE: Jul 21, 1941 , 71  yrs. old GENDER: Female ENDOSCOPIST: Hart Carwin, MD REFERRED BY:  Ellamae Sia, M.D. , Dr  G.Magrinat  PROCEDURE DATE:  12/30/2012 PROCEDURE:  EGD w/ biopsy ASA CLASS:     Class II INDICATIONS:  pernicious anemia, dysplastic polyp1/2013 removed,,intestinal metaplasia gastric body. MEDICATIONS: MAC sedation, administered by CRNA and propofol (Diprivan) 300mg  IV TOPICAL ANESTHETIC: none  DESCRIPTION OF PROCEDURE: After the risks benefits and alternatives of the procedure were thoroughly explained, informed consent was obtained.  The LB GIF-H180 [id number] endoscope was introduced through the mouth and advanced to the second portion of the duodenum. Without limitations.  The instrument was slowly withdrawn as the mucosa was fully examined.      Esophagus : esophageal mucosa appeared normal throughout the approximately mid and distal esophagus. There was no stricture or esophagitis. The Z line was normal, Stomach: Gastric mucosa appeared atrophic. There was absence of rugal folds in the body of the stomach. There are multiple small fundic gland polyps. At the site of the prior polypectomy  there was a polyp-like nodule which was soft and bled easily when biopsied. Multiple biopsies were obtained from this area. Retroflexion of the endoscope again confirmed atrophic-appearing gastric mucosa. Duodenum: Duodenal bulb and descending duodenum was normal[         The scope was then withdrawn from the patient and the procedure completed.  COMPLICATIONS: There were no complications. ENDOSCOPIC IMPRESSION:  atrophic gastritis Status post biopsies of the post polypectomy site from prior endoscopy rule out dysplasia Multiple small fundic gland polyps, not removed   RECOMMENDATIONS: Await pathology  results  REPEAT EXAM: In 2 year(s)  for EGD pending biopsy results.  eSigned:  Hart Carwin, MD 12/30/2012 10:36 AM   CC:  PATIENT NAME:  Kathryn, Thomas MR#: [id number]

## 2012-12-30 NOTE — Progress Notes (Signed)
Called to room to assist during endoscopic procedure.  Patient ID and intended procedure confirmed with present staff. Received instructions for my participation in the procedure from the performing physician.  

## 2012-12-30 NOTE — Patient Instructions (Addendum)
Discharge instructions given with verbal understanding. Biopsies taken. Resume previous medications. Resume previous medications. YOU HAD AN ENDOSCOPIC PROCEDURE TODAY AT THE [facility]: Refer to the procedure report that was given to you for any specific questions about what was found during the examination.  If the procedure report does not answer your questions, please call your gastroenterologist to clarify.  If you requested that your care partner not be given the details of your procedure findings, then the procedure report has been included in a sealed envelope for you to review at your convenience later.  YOU SHOULD EXPECT: Some feelings of bloating in the abdomen. Passage of more gas than usual.  Walking can help get rid of the air that was put into your GI tract during the procedure and reduce the bloating. If you had a lower endoscopy (such as a colonoscopy or flexible sigmoidoscopy) you may notice spotting of blood in your stool or on the toilet paper. If you underwent a bowel prep for your procedure, then you may not have a normal bowel movement for a few days.  DIET: Your first meal following the procedure should be a light meal and then it is ok to progress to your normal diet.  A half-sandwich or bowl of soup is an example of a good first meal.  Heavy or fried foods are harder to digest and may make you feel nauseous or bloated.  Likewise meals heavy in dairy and vegetables can cause extra gas to form and this can also increase the bloating.  Drink plenty of fluids but you should avoid alcoholic beverages for 24 hours.  ACTIVITY: Your care partner should take you home directly after the procedure.  You should plan to take it easy, moving slowly for the rest of the day.  You can resume normal activity the day after the procedure however you should NOT DRIVE or use heavy machinery for 24 hours (because of the sedation medicines used during the test).    SYMPTOMS TO REPORT  IMMEDIATELY: A gastroenterologist can be reached at any hour.  During normal business hours, 8:30 AM to 5:00 PM Monday through Friday, call [phone].  After hours and on weekends, please call the GI answering service at [phone] who will take a message and have the physician on call contact you.   Following upper endoscopy (EGD)  Vomiting of blood or coffee ground material  New chest pain or pain under the shoulder blades  Painful or persistently difficult swallowing  New shortness of breath  Fever of 100F or higher  Black, tarry-looking stools  FOLLOW UP: If any biopsies were taken you will be contacted by phone or by letter within the next 1-3 weeks.  Call your gastroenterologist if you have not heard about the biopsies in 3 weeks.  Our staff will call the home number listed on your records the next business day following your procedure to check on you and address any questions or concerns that you may have at that time regarding the information given to you following your procedure. This is a courtesy call and so if there is no answer at the home number and we have not heard from you through the emergency physician on call, we will assume that you have returned to your regular daily activities without incident.  SIGNATURES/CONFIDENTIALITY: You and/or your care partner have signed paperwork which will be entered into your electronic medical record.  These signatures attest to the fact that that the information above  on your After Visit Summary has been reviewed and is understood.  Full responsibility of the confidentiality of this discharge information lies with you and/or your care-partner.

## 2012-12-31 ENCOUNTER — Other Ambulatory Visit: Payer: Self-pay | Admitting: Physician Assistant

## 2012-12-31 ENCOUNTER — Telehealth: Payer: Self-pay

## 2012-12-31 NOTE — Telephone Encounter (Signed)
  Follow up Call-  Call back number 12/30/2012 12/03/2011  Post procedure Call Back phone  # [phone] cell [phone]  Permission to leave phone message Yes Yes     Patient questions:  Do you have a fever, pain , or abdominal swelling? no Pain Score  0 *  Have you tolerated food without any problems? yes  Have you been able to return to your normal activities? yes  Do you have any questions about your discharge instructions: Diet   no Medications  no Follow up visit  no  Do you have questions or concerns about your Care? no  Actions: * If pain score is 4 or above: No action needed, pain <4.

## 2013-01-04 ENCOUNTER — Encounter: Payer: Self-pay | Admitting: Internal Medicine

## 2013-01-05 ENCOUNTER — Other Ambulatory Visit: Payer: Self-pay | Admitting: *Deleted

## 2013-01-05 MED ORDER — AMOXICILLIN 500 MG PO CAPS: ORAL_CAPSULE | ORAL | Status: DC

## 2013-01-05 MED ORDER — CLARITHROMYCIN 500 MG PO TABS: 500.0000 mg | ORAL_TABLET | Freq: Two times a day (BID) | ORAL | Status: DC

## 2013-01-27 ENCOUNTER — Telehealth: Payer: Self-pay | Admitting: Internal Medicine

## 2013-01-27 ENCOUNTER — Other Ambulatory Visit: Payer: Self-pay | Admitting: Family Medicine

## 2013-01-27 NOTE — Telephone Encounter (Signed)
Spoke with patient and she finished the antibiotics for h. Pylori. She is concerned about knowing the infection is gone since she did not have symptoms. Please, advise.

## 2013-01-27 NOTE — Telephone Encounter (Signed)
Left a message for patient to call me. 

## 2013-01-27 NOTE — Telephone Encounter (Signed)
We don't routinely do an Urea breath test because the antibiotics are 90 % effective. If she insists, please schedule Pyloro tech.

## 2013-01-28 NOTE — Telephone Encounter (Signed)
Patient given Dr. Regino Schultze recommendation. She is fine with not doing the breath test.

## 2013-01-28 NOTE — Telephone Encounter (Signed)
Left a message for patient to call me. 

## 2013-02-18 ENCOUNTER — Other Ambulatory Visit: Payer: Self-pay | Admitting: Physician Assistant

## 2013-02-22 ENCOUNTER — Other Ambulatory Visit: Payer: Self-pay | Admitting: Physician Assistant

## 2013-02-24 ENCOUNTER — Telehealth: Payer: Self-pay

## 2013-02-24 DIAGNOSIS — R3129 Other microscopic hematuria: Secondary | ICD-10-CM

## 2013-02-24 NOTE — Telephone Encounter (Signed)
Pharmacy sent request for Rx for phentermine 37.5 mg for pt. Dr Patsy Lager, do you want to Rx this for Pt?

## 2013-02-25 NOTE — Telephone Encounter (Signed)
Called her back- let her know that I am sorry but I am not going to rx phentermine.  She plans to see her other doctor for this.  Also let her know that I am going to send her to urology for microhematuria with a negative urine culture in January of this year- she states that she did bring in a repeat urine sample for recheck, but this is not in Epic.  She is ok with this plan

## 2013-03-24 ENCOUNTER — Telehealth: Payer: Self-pay

## 2013-03-24 NOTE — Telephone Encounter (Signed)
Pt is returning our call regarding her blood pressure medication  Best number [phone]

## 2013-03-25 NOTE — Telephone Encounter (Signed)
I see no record of this call. Left message for her to call back to see if I can hel p

## 2013-03-26 ENCOUNTER — Other Ambulatory Visit: Payer: Self-pay | Admitting: *Deleted

## 2013-03-26 MED ORDER — AMLODIPINE BESYLATE 5 MG PO TABS: ORAL_TABLET | ORAL | Status: DC

## 2013-03-26 NOTE — Telephone Encounter (Signed)
Pt called and amlodipine was called in

## 2013-03-26 NOTE — Telephone Encounter (Signed)
Sent refill of Amlodipine for 30 days. Advised patient that we need to see her in the office. She is going out of town for vacation. She states she will call to see when Dr. Patsy Lager working and come in then.

## 2013-03-29 ENCOUNTER — Other Ambulatory Visit: Payer: Self-pay | Admitting: Radiology

## 2013-03-29 MED ORDER — AMLODIPINE BESYLATE 5 MG PO TABS: ORAL_TABLET | ORAL | Status: DC

## 2013-04-27 ENCOUNTER — Ambulatory Visit (INDEPENDENT_AMBULATORY_CARE_PROVIDER_SITE_OTHER): Payer: Medicare Other | Admitting: Family Medicine

## 2013-04-27 ENCOUNTER — Ambulatory Visit: Payer: Medicare Other

## 2013-04-27 VITALS — BP 140/86 | HR 76 | Temp 98.1°F | Resp 16 | Ht 66.5 in | Wt 266.0 lb

## 2013-04-27 DIAGNOSIS — R609 Edema, unspecified: Secondary | ICD-10-CM

## 2013-04-27 DIAGNOSIS — I10 Essential (primary) hypertension: Secondary | ICD-10-CM

## 2013-04-27 DIAGNOSIS — R0602 Shortness of breath: Secondary | ICD-10-CM

## 2013-04-27 DIAGNOSIS — F411 Generalized anxiety disorder: Secondary | ICD-10-CM

## 2013-04-27 DIAGNOSIS — R3129 Other microscopic hematuria: Secondary | ICD-10-CM

## 2013-04-27 LAB — POCT CBC
Granulocyte percent: 68.6 %G (ref 37–80)
HCT, POC: 43.7 % (ref 37.7–47.9)
Hemoglobin: 13.5 g/dL (ref 12.2–16.2)
Lymph, poc: 2.4 (ref 0.6–3.4)
MCH, POC: 28 pg (ref 27–31.2)
MCHC: 30.9 g/dL — AB (ref 31.8–35.4)
MCV: 90.5 fL (ref 80–97)
MID (cbc): 0.6 (ref 0–0.9)
MPV: 9.6 fL (ref 0–99.8)
POC Granulocyte: 6.4 (ref 2–6.9)
POC LYMPH PERCENT: 25.2 %L (ref 10–50)
POC MID %: 6.2 %M (ref 0–12)
Platelet Count, POC: 328 10*3/uL (ref 142–424)
RBC: 4.83 M/uL (ref 4.04–5.48)
RDW, POC: 14.4 %
WBC: 9.4 10*3/uL (ref 4.6–10.2)

## 2013-04-27 LAB — POCT UA - MICROSCOPIC ONLY
Casts, Ur, LPF, POC: NEGATIVE
Crystals, Ur, HPF, POC: NEGATIVE
Mucus, UA: NEGATIVE
Yeast, UA: NEGATIVE

## 2013-04-27 LAB — POCT URINALYSIS DIPSTICK
Bilirubin, UA: NEGATIVE
Glucose, UA: NEGATIVE
Ketones, UA: NEGATIVE
Leukocytes, UA: NEGATIVE
Nitrite, UA: NEGATIVE
Protein, UA: 100
Spec Grav, UA: >=1.03
Urobilinogen, UA: 0.2
pH, UA: 5

## 2013-04-27 LAB — TSH: TSH: 2.693 u[IU]/mL (ref 0.350–4.500)

## 2013-04-27 LAB — BASIC METABOLIC PANEL
BUN: 19 mg/dL (ref 6–23)
CO2: 29 mEq/L (ref 19–32)
Calcium: 9.3 mg/dL (ref 8.4–10.5)
Chloride: 104 mEq/L (ref 96–112)
Creat: 1.1 mg/dL (ref 0.50–1.10)
Glucose, Bld: 148 mg/dL — ABNORMAL HIGH (ref 70–99)
Potassium: 4.3 mEq/L (ref 3.5–5.3)
Sodium: 139 mEq/L (ref 135–145)

## 2013-04-27 MED ORDER — LISINOPRIL 10 MG PO TABS: 10.0000 mg | ORAL_TABLET | Freq: Every day | ORAL | Status: DC

## 2013-04-27 MED ORDER — ALPRAZOLAM 0.25 MG PO TABS: 0.2500 mg | ORAL_TABLET | Freq: Two times a day (BID) | ORAL | Status: DC | PRN

## 2013-04-27 NOTE — Progress Notes (Addendum)
[facility] [address], [place] [address] [phone]  Date:  04/27/2013   Name:  Kathryn Thomas   DOB:  [birth year]   MRN:  [id number]  PCP:  Tonye Pearson, MD    Chief Complaint: Medication Refill and Follow-up   History of Present Illness:  Kathryn Thomas is a 72 y.o. very pleasant female patient who presents with the following:  She is here today for a follow-up. She has noted trouble with swelling of her hands and feet for 3 weeks or so.   The swelling seems to be stable, elevating her feet does not help- she is the same in the morning.  She notes this in both feet.    She does take amlodipine for BP- she has been taking this for 3 or 4 years.    She also needs a refill on her alprazolam- she takes one twice a day on average  She notes that when she walks she feels "like I can't breathe," "I just don't have the energy."  She has noted this over the last several months.  She will get SOB with exercise.  At night she has a hard time laying flat, but this seems to be more due to her back pain.  This is not acutely worse  She has ARF listed in her problem list, but she cannot recall when this occurred and is not aware of any current renal issues.    She does have any CP.    She had a regular coke prior to her visit Patient Active Problem List   Diagnosis Date Noted  . Pernicious anemia 10/01/2011  . Multiple gastric polyps 10/01/2011  . B12 deficiency anemia 10/01/2011  . Anemia 09/28/2011  . Weight loss 09/28/2011  . Positional lightheadedness 09/28/2011  . Acute renal failure 09/28/2011  . UTI (lower urinary tract infection) 09/28/2011    Past Medical History  Diagnosis Date  . Pernicious anemia   . Shortness of breath   . Blood transfusion     " no reaction to transfusion "  . Intestinal metaplasia of gastric mucosa 09/2011    high grade dysplasia  . Gastritis   . Vitamin B12 deficiency   . Acute renal failure   . Gastric  polyp   . CVA (cerebral infarction)   . Anal stenosis 2010  . External hemorrhoids   . Anxiety   . Depression   . Hypertension     Past Surgical History  Procedure Laterality Date  . Abdominal hysterectomy    . Back surgery  1980    three lower spine surgeries.  . Esophagogastroduodenoscopy  10/01/2011    Procedure: ESOPHAGOGASTRODUODENOSCOPY (EGD);  Surgeon: Hart Carwin, MD;  Location: [facility] END[facility]COPY;  Service: Endoscopy;  Laterality: N/A;    History  Substance Use Topics  . Smoking status: Never Smoker   . Smokeless tobacco: Never Used  . Alcohol Use: No    Family History  Problem Relation Age of Onset  . Cervical cancer Mother   . Heart disease Father   . Multiple myeloma Sister     spleen involvement  . Cancer Sister     bone marrow  . Stroke Sister   . Heart disease Mother   . Colon cancer Neg Hx     No Known Allergies  Medication list has been reviewed and updated.  Current Outpatient Prescriptions on File Prior to Visit  Medication Sig Dispense Refill  . ALPRAZolam Prudy Feeler)  0.25 MG tablet Take 1 tablet (0.25 mg total) by mouth 2 (two) times daily as needed for sleep or anxiety.  20 tablet  0  . amLODipine (NORVASC) 5 MG tablet TAKE ONE TABLET BY MOUTH EVERY DAY Need to come in for routine visit for further refills  30 tablet  0  . buPROPion (WELLBUTRIN SR) 150 MG 12 hr tablet TAKE ONE TABLET BY MOUTH EVERY DAY FOR 3 DAYS, THEN INCREASE TO ONE TABLET BY MOUTH TWICE DAILY  60 tablet  1  . cyanocobalamin (,VITAMIN B-12,) 1000 MCG/ML injection INJECT INTO THE MUSCLE EVERY 30 DAYS  1 mL  2  . Vitamin D, Ergocalciferol, (DRISDOL) 50000 UNITS CAPS TAKE ONE CAPSULE BY MOUTH ONCE A WEEK  12 capsule  0  . amoxicillin (AMOXIL) 500 MG capsule Take one po TID  30 capsule  0  . clarithromycin (BIAXIN) 500 MG tablet Take 1 tablet (500 mg total) by mouth 2 (two) times daily.  20 tablet  0   No current facility-administered medications on file prior to visit.    Review  of Systems:  As per HPI- otherwise negative.   Physical Examination: Filed Vitals:   04/27/13 0846  BP: 140/86  Pulse: 76  Temp: 98.1 F (36.7 C)  Resp: 16   Filed Vitals:   04/27/13 0846  Height: 5' 6.5" (1.689 m)  Weight: 266 lb (120.657 kg)   Body mass index is 42.3 kg/(m^2). Ideal Body Weight: Weight in (lb) to have BMI = 25: 156.9  GEN: WDWN, NAD, Non-toxic, A & O x 3 HEENT: Atraumatic, Normocephalic. Neck supple. No masses, No LAD. Ears and Nose: No external deformity. CV: RRR, No M/G/R. No JVD. No thrill. No extra heart sounds. PULM: CTA B, no wheezes, crackles, rhonchi. No retractions. No resp. distress. No accessory muscle use. ABD: S, NT, ND, +BS. No rebound. No HSM. EXTR: No c/c/e NEURO Normal gait.  PSYCH: Normally interactive. Conversant. Not depressed or anxious appearing.  Calm demeanor.  She does have trace LE edema in her feet bilaterally.  No swelling or tenderness of her feet or calves   [facility] reading (PRIMARY) by  Dr. Patsy Lager. CXR: negative, no effusion  CHEST - 2 VIEW  Comparison: 09/28/2011  Findings: Cardiomediastinal silhouette is stable. No acute infiltrate or pleural effusion. No pulmonary edema. Stable mild degenerative changes lower thoracic spine.  IMPRESSION: No active disease. No significant change.  Clinically significant discrepancy from primary report, if provided: None     Results for orders placed in visit on 04/27/13  POCT CBC      Result Value Range   WBC 9.4  4.6 - 10.2 K/uL   Lymph, poc 2.4  0.6 - 3.4   POC LYMPH PERCENT 25.2  10 - 50 %L   MID (cbc) 0.6  0 - 0.9   POC MID % 6.2  0 - 12 %M   POC Granulocyte 6.4  2 - 6.9   Granulocyte percent 68.6  37 - 80 %G   RBC 4.83  4.04 - 5.48 M/uL   Hemoglobin 13.5  12.2 - 16.2 g/dL   HCT, POC 16.1  09.6 - 47.9 %   MCV 90.5  80 - 97 fL   MCH, POC 28.0  27 - 31.2 pg   MCHC 30.9 (*) 31.8 - 35.4 g/dL   RDW, POC 04.5     Platelet Count, POC 328  142 - 424 K/uL   MPV 9.6  0  - 99.8 fL  POCT URINALYSIS DIPSTICK      Result Value Range   Color, UA yellow     Clarity, UA clear     Glucose, UA neg     Bilirubin, UA neg     Ketones, UA neg     Spec Grav, UA >=1.030     Blood, UA moderate     pH, UA 5.0     Protein, UA 100     Urobilinogen, UA 0.2     Nitrite, UA neg     Leukocytes, UA Negative    POCT UA - MICROSCOPIC ONLY      Result Value Range   WBC, Ur, HPF, POC 0-3     RBC, urine, microscopic 0-12     Bacteria, U Microscopic trace     Mucus, UA neg     Epithelial cells, urine per micros 1-3     Crystals, Ur, HPF, POC neg     Casts, Ur, LPF, POC neg     Yeast, UA neg      Assessment and Plan: Shortness of breath - Plan: DG Chest 2 View, POCT CBC, POCT urinalysis dipstick, POCT UA - Microscopic Only, Brain natriuretic peptide, Basic metabolic panel, TSH  Microhematuria - Plan: Urine culture, Ambulatory referral to Urology  HTN (hypertension) - Plan: lisinopril (PRINIVIL,ZESTRIL) 10 MG tablet  Anxiety state, unspecified - Plan: ALPRAZolam (XANAX) 0.25 MG tablet  Await her labs to ensure no evidence of CHF.  Assuming her renal function is ok will add hctz for fluid retention.  Consider cardiology referral if BNP is normal to ensure no CAD causing her fatigue   Referral to urology to evaluate persistent microhematuria   Signed Abbe Amsterdam, MD  Called to go over her labs.  BNP is normal.  Her renal function is also normal, will start HCTZ as needed for swelling.   Referral to cardiology due to her severe fatigue.  Recheck her for BMP in 6- 8 weeks.  She is in agreement with this plan.

## 2013-04-27 NOTE — Patient Instructions (Addendum)
Stop using the norvasc (amlodipine) for your blood pressure as this may be contributing to your swelling.  I am going to have you start on a low dose of lisinopril for your blood pressure.  Assuming your kidney function looks ok we will start a low dose of a fluid pill as well.  I will be in touch with you regarding your other labs.   Please consider joining a bereavement support group.   congratulations on your great grandchild!

## 2013-04-28 LAB — URINE CULTURE: Colony Count: 4000

## 2013-04-28 LAB — BRAIN NATRIURETIC PEPTIDE: Brain Natriuretic Peptide: 38.9 pg/mL (ref 0.0–100.0)

## 2013-05-05 MED ORDER — HYDROCHLOROTHIAZIDE 12.5 MG PO TABS: ORAL_TABLET | ORAL | Status: DC

## 2013-05-05 NOTE — Addendum Note (Signed)
Addended by: Abbe Amsterdam C on: 05/05/2013 05:39 PM   Modules accepted: Orders

## 2013-05-06 ENCOUNTER — Other Ambulatory Visit: Payer: Self-pay | Admitting: Family Medicine

## 2013-05-06 NOTE — Telephone Encounter (Signed)
Pt is needing to talk with someone about getting a copy of all her lab work and ekg and help her understand her results and what is going on that she needs to be referred to a cardiologist and help her explain to family  Best number [phone]

## 2013-05-10 NOTE — Telephone Encounter (Signed)
Dr Patsy Lager, have you already talked to the pt about this? I will be happy to call her. Do you want me to advise her that we are sending her to cardiologist to eval for possible CAD causing her fatigue since BNP was normal and didn't show evidence of CHF?

## 2013-05-11 NOTE — Telephone Encounter (Signed)
Called her back and went over her concerns.  She understands- sometimes severe fatigue with exercise can be a warning sign of CAD.  She does not note much improvement with the HCTZ.  She is taking 12.5 mg- may take 2 a day if needed.

## 2013-05-13 ENCOUNTER — Emergency Department (HOSPITAL_COMMUNITY)
Admission: EM | Admit: 2013-05-13 | Discharge: 2013-05-13 | Disposition: A | Discharge status: Home / Self Care | Payer: Medicare Other | Attending: Emergency Medicine | Admitting: Emergency Medicine

## 2013-05-13 ENCOUNTER — Encounter (HOSPITAL_COMMUNITY): Payer: Self-pay | Admitting: *Deleted

## 2013-05-13 ENCOUNTER — Emergency Department (HOSPITAL_COMMUNITY): Payer: Medicare Other

## 2013-05-13 DIAGNOSIS — F329 Major depressive disorder, single episode, unspecified: Secondary | ICD-10-CM | POA: Insufficient documentation

## 2013-05-13 DIAGNOSIS — M7989 Other specified soft tissue disorders: Secondary | ICD-10-CM | POA: Insufficient documentation

## 2013-05-13 DIAGNOSIS — F411 Generalized anxiety disorder: Secondary | ICD-10-CM | POA: Insufficient documentation

## 2013-05-13 DIAGNOSIS — Z5189 Encounter for other specified aftercare: Secondary | ICD-10-CM | POA: Insufficient documentation

## 2013-05-13 DIAGNOSIS — Z8639 Personal history of other endocrine, nutritional and metabolic disease: Secondary | ICD-10-CM | POA: Insufficient documentation

## 2013-05-13 DIAGNOSIS — I10 Essential (primary) hypertension: Secondary | ICD-10-CM | POA: Insufficient documentation

## 2013-05-13 DIAGNOSIS — R072 Precordial pain: Secondary | ICD-10-CM | POA: Diagnosis present

## 2013-05-13 DIAGNOSIS — Z8719 Personal history of other diseases of the digestive system: Secondary | ICD-10-CM | POA: Insufficient documentation

## 2013-05-13 DIAGNOSIS — N179 Acute kidney failure, unspecified: Secondary | ICD-10-CM | POA: Insufficient documentation

## 2013-05-13 DIAGNOSIS — Z862 Personal history of diseases of the blood and blood-forming organs and certain disorders involving the immune mechanism: Secondary | ICD-10-CM | POA: Insufficient documentation

## 2013-05-13 DIAGNOSIS — F3289 Other specified depressive episodes: Secondary | ICD-10-CM | POA: Insufficient documentation

## 2013-05-13 DIAGNOSIS — R5381 Other malaise: Secondary | ICD-10-CM | POA: Insufficient documentation

## 2013-05-13 DIAGNOSIS — Z8679 Personal history of other diseases of the circulatory system: Secondary | ICD-10-CM | POA: Insufficient documentation

## 2013-05-13 DIAGNOSIS — Z79899 Other long term (current) drug therapy: Secondary | ICD-10-CM | POA: Insufficient documentation

## 2013-05-13 DIAGNOSIS — K624 Stenosis of anus and rectum: Secondary | ICD-10-CM | POA: Insufficient documentation

## 2013-05-13 DIAGNOSIS — R0789 Other chest pain: Secondary | ICD-10-CM | POA: Insufficient documentation

## 2013-05-13 DIAGNOSIS — E538 Deficiency of other specified B group vitamins: Secondary | ICD-10-CM | POA: Insufficient documentation

## 2013-05-13 DIAGNOSIS — Z8673 Personal history of transient ischemic attack (TIA), and cerebral infarction without residual deficits: Secondary | ICD-10-CM | POA: Insufficient documentation

## 2013-05-13 DIAGNOSIS — R079 Chest pain, unspecified: Secondary | ICD-10-CM

## 2013-05-13 LAB — URINALYSIS, ROUTINE W REFLEX MICROSCOPIC
Glucose, UA: NEGATIVE mg/dL
Ketones, ur: NEGATIVE mg/dL
Leukocytes, UA: NEGATIVE
Nitrite: NEGATIVE
Protein, ur: 100 mg/dL — AB
Specific Gravity, Urine: 1.028 (ref 1.005–1.030)
Urobilinogen, UA: 0.2 mg/dL (ref 0.0–1.0)
pH: 5.5 (ref 5.0–8.0)

## 2013-05-13 LAB — CBC
HCT: 40 % (ref 36.0–46.0)
Hemoglobin: 14 g/dL (ref 12.0–15.0)
MCH: 29.5 pg (ref 26.0–34.0)
MCHC: 35 g/dL (ref 30.0–36.0)
MCV: 84.4 fL (ref 78.0–100.0)
Platelets: 288 10*3/uL (ref 150–400)
RBC: 4.74 MIL/uL (ref 3.87–5.11)
RDW: 13.3 % (ref 11.5–15.5)
WBC: 8.6 10*3/uL (ref 4.0–10.5)

## 2013-05-13 LAB — POCT I-STAT TROPONIN I: Troponin i, poc: 0.01 ng/mL (ref 0.00–0.08)

## 2013-05-13 LAB — COMPREHENSIVE METABOLIC PANEL
ALT: 13 U/L (ref 0–35)
AST: 14 U/L (ref 0–37)
Albumin: 3.5 g/dL (ref 3.5–5.2)
Alkaline Phosphatase: 101 U/L (ref 39–117)
BUN: 23 mg/dL (ref 6–23)
CO2: 30 mEq/L (ref 19–32)
Calcium: 9.1 mg/dL (ref 8.4–10.5)
Chloride: 102 mEq/L (ref 96–112)
Creatinine, Ser: 0.98 mg/dL (ref 0.50–1.10)
GFR calc Af Amer: 65 mL/min — ABNORMAL LOW (ref >90)
GFR calc non Af Amer: 56 mL/min — ABNORMAL LOW (ref >90)
Glucose, Bld: 130 mg/dL — ABNORMAL HIGH (ref 70–99)
Potassium: 3.9 mEq/L (ref 3.5–5.1)
Sodium: 139 mEq/L (ref 135–145)
Total Bilirubin: 0.2 mg/dL — ABNORMAL LOW (ref 0.3–1.2)
Total Protein: 7.6 g/dL (ref 6.0–8.3)

## 2013-05-13 LAB — URINE MICROSCOPIC-ADD ON

## 2013-05-13 LAB — TROPONIN I: Troponin I: <0.3 ng/mL (ref <0.30)

## 2013-05-13 LAB — PRO B NATRIURETIC PEPTIDE: Pro B Natriuretic peptide (BNP): 102.4 pg/mL (ref 0–125)

## 2013-05-13 NOTE — ED Notes (Signed)
daughter at bedside

## 2013-05-13 NOTE — ED Notes (Signed)
Cardiology PA at bedside. 

## 2013-05-13 NOTE — Consult Note (Signed)
CARDIOLOGY CONSULT NOTE   Patient ID: Kathryn Thomas MRN: [id number], DOB/AGE: 1941-03-17 72 y.o. Date of Encounter: 05/13/2013  Primary Physician: Tonye Pearson, MD Primary Cardiologist: New  Chief Complaint: Chest Pain, pitting edema   HPI: Kathryn Thomas is a 72 y.o. female with HTN, anxiety/depression w/o history of CAD seen by cardiology for chest pain. While walking through the grocery store, patient developed sudden, sharp, nonradiating, midsternal 5/10 chest pain and tightness that hurt worse with deep breathing and made better by pressing on her sternum. She describes associated lightheadedness, anxiety, and sweating but denied nausea, racing thoughts, and palpations.  Patient's daughter called 911 and was given aspirin and nitroglycerin in route to the emergency room. The pain subsequently resolved following aspirin and nitroglycerin. Patient currently pain free. In the past month she has had progressively worsening SOB with exertion, weight gain (12lbs), finger swelling, and pitting edema. She can no longer complete activities of daily living without feeling exhausted and severely out of breath and spends most of the time napping during the day when she is not socializing with family. She cannot walk to mailbox without severe DOE. She was last seen two weeks ago by PCP who found hematuria and placed her on HCTZ for the fluid overload. She advised an appointment with Dr Jens Som at [facility] which is scheduled in the coming weeks.   Past Medical History  Diagnosis Date  . Pernicious anemia   . Shortness of breath   . Blood transfusion     " no reaction to transfusion "  . Intestinal metaplasia of gastric mucosa 09/2011    high grade dysplasia  . Gastritis   . Vitamin B12 deficiency   . Acute renal failure   . Gastric polyp   . Anal stenosis 2010  . External hemorrhoids   . Anxiety   . Depression   . Hypertension     Surgical History:  Past Surgical History    Procedure Laterality Date  . Abdominal hysterectomy    . Back surgery  1980    three lower spine surgeries.  . Esophagogastroduodenoscopy  10/01/2011    Procedure: ESOPHAGOGASTRODUODENOSCOPY (EGD);  Surgeon: Hart Carwin, MD;  Location: [facility] ENDOSCOPY;  Service: Endoscopy;  Laterality: N/A;     I have reviewed the patient's current medications. Prior to Admission medications   Medication Sig Start Date End Date Taking? Authorizing Provider  ALPRAZolam (XANAX) 0.25 MG tablet Take 1 tablet (0.25 mg total) by mouth 2 (two) times daily as needed for sleep or anxiety. 04/27/13  Yes Gwenlyn Found Copland, MD  buPROPion (WELLBUTRIN SR) 150 MG 12 hr tablet Take 150 mg by mouth 2 (two) times daily.   Yes Historical Provider, MD  Cyanocobalamin (VITAMIN B-12 IJ) Inject 1 mL as directed every 30 (thirty) days.   Yes Historical Provider, MD  hydrochlorothiazide (HYDRODIURIL) 12.5 MG tablet Take daily as needed for swelling 05/05/13  Yes Jessica C Copland, MD  lisinopril (PRINIVIL,ZESTRIL) 10 MG tablet Take 1 tablet (10 mg total) by mouth daily. 04/27/13  Yes Gwenlyn Found Copland, MD  OVER THE COUNTER MEDICATION Apply 1 application topically as needed (pain). Activon   Yes Historical Provider, MD  Vitamin D, Ergocalciferol, (DRISDOL) 50000 UNITS CAPS capsule Take 50,000 Units by mouth every 7 (seven) days. On Wednesday.   Yes Historical Provider, MD   Scheduled Meds: Continuous Infusions: PRN Meds:.    Allergies: No Known Allergies  History   Social History  . Marital Status: Widowed  Spouse Name: N/A    Number of Children: 2  . Years of Education: N/A   Occupational History  . Retired    Social History Main Topics  . Smoking status: Never Smoker   . Smokeless tobacco: Never Used  . Alcohol Use: No  . Drug Use: No  . Sexual Activity: No   Other Topics Concern  . Not on file   Social History Narrative   Patient is widowed 7 years and lives alone within 5 minutes of her two daughters. She is  unemployed. Has medicare. Has a good social support with family and church friends.     Family History  Problem Relation Age of Onset  . Cervical cancer Mother   . Heart disease Father     died at 40 of heart attack  . Multiple myeloma Sister     spleen involvement  . Cancer Sister     bone marrow  . Stroke Sister   . Heart disease Mother     died of CHF  . Colon cancer Neg Hx    Family Status  Relation Status Death Age  . Mother Deceased   . Father Deceased     Review of Systems:   Full 14-point review of systems otherwise negative except as noted above.  Physical Exam: Blood pressure 114/67, pulse 62, temperature 98.7 F (37.1 C), temperature source Oral, resp. rate 18, SpO2 98.00%. General: Well developed, well nourished,female in no acute distress. Head: Normocephalic, atraumatic, sclera non-icteric, no xanthomas, nares are without discharge. Dentition: poor Neck: No carotid bruits. JVP, no JVD. No thyromegally Lungs: Good expansion bilaterally. without wheezes or rhonchi.  Heart: Regular rate and rhythm with S1 S2.  No S3 or S4.  No murmur, no rubs, or gallops appreciated. Abdomen: Soft, non-tender, non-distended with normoactive bowel sounds. No hepatomegaly. No rebound/guarding. No obvious abdominal masses. Msk:  Strength and tone appear normal for age. No joint deformities or effusions, no spine or costo-vertebral angle tenderness. Extremities: No clubbing or cyanosis. Some edema in her hands, fingers swollen.  Distal pedal pulses are 2+ in 4 extrem Neuro: Alert and oriented X 3. Moves all extremities spontaneously. No focal deficits noted. Psych:  Responds to questions appropriately with a normal affect. Skin: No rashes or lesions noted  Labs:   Lab Results  Component Value Date   WBC 8.6 05/13/2013   HGB 14.0 05/13/2013   HCT 40.0 05/13/2013   MCV 84.4 05/13/2013   PLT 288 05/13/2013   No results found for this basename: INR,  in the last 72 hours   Recent  Labs Lab 05/13/13 1237  NA 139  K 3.9  CL 102  CO2 30  BUN 23  CREATININE 0.98  CALCIUM 9.1  PROT 7.6  BILITOT 0.2*  ALKPHOS 101  ALT 13  AST 14  GLUCOSE 130*    Recent Labs  05/13/13 1253  TROPIPOC 0.01   Pro B Natriuretic peptide (BNP)  Date/Time Value Range Status  05/13/2013 12:37 PM 102.4  0 - 125 pg/mL Final    Radiology/Studies: Dg Chest 2 View 05/13/2013   *RADIOLOGY REPORT*  Clinical Data: Chest pain  CHEST - 2 VIEW  Comparison:  April 27, 2013  Findings:  There is no edema or consolidation.  The heart size and pulmonary vascularity are normal.  No adenopathy.  There is degenerative change in the thoracic spine.  IMPRESSION:  No edema or consolidation.   Original Report Authenticated By: Bretta Bang, M.D.  ECG: Normal Sinus Rhythm, no acute ischemic changes.   ASSESSMENT AND PLAN:  Principal Problem:   Precordial pain  Patient was seen by cardiology for chest pain. Her chest pain is atypical, poc troponin is negative and her ECG is normal, but will wait for second troponin. The likelihood of AMI or CAD is low, but should have stress test for ischemic eval if enzymes remain negative. Based on patient's desires, would consider doing an echo/nuclear stress in outpatient setting. Patient states she will only do if covered by medicare.   Signed,  Britt Bottom PA-S Theodore Demark, PA-C 05/13/2013 5:41 PM  Theodore Demark, PA-C 05/13/2013 5:41 PM Beeper [phone] Seen in ER. Risk factors for CAD include hypertension and her father died of an MI. I reviewed her EKG which shows nonspecific T wave flattening laterally but no ischemic changes. Physical exam is unremarkable. Her previous history of edema may have been partially related to amlodipine which was subsequently stopped. Agree with plan. Can be discharged from the ER if second troponin is negative.

## 2013-05-13 NOTE — ED Provider Notes (Signed)
I saw and evaluated the patient, reviewed the resident's note and I agree with the findings and plan. Pt with central chest pressure starting today. Symptoms are completley resolved. No cardiac history. Normal EKG and neg trop. Evaluated by Dr Patty Sermons. Suggest repeat trop and outpt follow up for stress testing. Pt is agreeable to this. Second trop normal. Return precautions given.   Loren Racer, MD 05/13/13 [phone]

## 2013-05-13 NOTE — ED Provider Notes (Signed)
CSN: [id number]     Arrival date & time 05/13/13  1202 History     First MD Initiated Contact with Patient 05/13/13 1209     Chief Complaint  Patient presents with  . Chest Pain   (Consider location/radiation/quality/duration/timing/severity/associated sxs/prior Treatment) HPI 72 year old white female with HTN, Prior CVA, and Anxiety/Depression who presents with chest pain which began at about 11:00 this morning.  Patient reports she was at the grocery store and developed sudden onset sternal chest pain.  Pain described as a "tightness" and 10/10 in severity.  No associated nausea or vomiting. No radiation. Patient reports associated diaphoresis.  Patient called 911 and was given aspirin and nitroglycerin in route to the emergency room.  The pain subsequently resolved following aspirin and nitroglycerin.  Patient currently pain free.    Past Medical History  Diagnosis Date  . Pernicious anemia   . Shortness of breath   . Blood transfusion     " no reaction to transfusion "  . Intestinal metaplasia of gastric mucosa 09/2011    high grade dysplasia  . Gastritis   . Vitamin B12 deficiency   . Acute renal failure   . Gastric polyp   . CVA (cerebral infarction)   . Anal stenosis 2010  . External hemorrhoids   . Anxiety   . Depression   . Hypertension    Past Surgical History  Procedure Laterality Date  . Abdominal hysterectomy    . Back surgery  1980    three lower spine surgeries.  . Esophagogastroduodenoscopy  10/01/2011    Procedure: ESOPHAGOGASTRODUODENOSCOPY (EGD);  Surgeon: Hart Carwin, MD;  Location: [facility] ENDOSCOPY;  Service: Endoscopy;  Laterality: N/A;   Family History  Problem Relation Age of Onset  . Cervical cancer Mother   . Heart disease Father   . Multiple myeloma Sister     spleen involvement  . Cancer Sister     bone marrow  . Stroke Sister   . Heart disease Mother   . Colon cancer Neg Hx    History  Substance Use Topics  . Smoking status: Never Smoker    . Smokeless tobacco: Never Used  . Alcohol Use: No   OB History   Grav Para Term Preterm Abortions TAB SAB Ect Mult Living                 Review of Systems  Constitutional: Positive for fatigue. Negative for fever and chills.  Respiratory: Positive for chest tightness. Negative for shortness of breath.   Cardiovascular: Positive for leg swelling.  Gastrointestinal: Negative for nausea, vomiting and abdominal pain.  Genitourinary: Negative.   Musculoskeletal: Negative.   Skin: Negative for rash.  Neurological: Positive for weakness.    Allergies  Review of patient's allergies indicates no known allergies.  Home Medications   Current Outpatient Rx  Name  Route  Sig  Dispense  Refill  . ALPRAZolam (XANAX) 0.25 MG tablet   Oral   Take 1 tablet (0.25 mg total) by mouth 2 (two) times daily as needed for sleep or anxiety.   60 tablet   3   . buPROPion (WELLBUTRIN SR) 150 MG 12 hr tablet   Oral   Take 150 mg by mouth 2 (two) times daily.         . Cyanocobalamin (VITAMIN B-12 IJ)   Injection   Inject 1 mL as directed every 30 (thirty) days.         . hydrochlorothiazide (HYDRODIURIL) 12.5 MG tablet  Take daily as needed for swelling   60 tablet   3   . lisinopril (PRINIVIL,ZESTRIL) 10 MG tablet   Oral   Take 1 tablet (10 mg total) by mouth daily.   30 tablet   3   . OVER THE COUNTER MEDICATION   Apply externally   Apply 1 application topically as needed (pain). Activon         . Vitamin D, Ergocalciferol, (DRISDOL) 50000 UNITS CAPS capsule   Oral   Take 50,000 Units by mouth every 7 (seven) days. On Wednesday.          BP 150/64  Pulse 72  Temp(Src) 98.7 F (37.1 C) (Oral)  Resp 16  SpO2 98% Physical Exam  Constitutional: She is oriented to person, place, and time. She appears well-developed and well-nourished. No distress.  HENT:  Head: Normocephalic and atraumatic.  Cardiovascular: Normal rate and regular rhythm.   No murmur  heard. Pulmonary/Chest: Effort normal and breath sounds normal. She has no wheezes. She has no rales.  Abdominal: Soft. She exhibits no distension. There is no tenderness.  Musculoskeletal:  3+ LE edema.  Neurological: She is alert and oriented to person, place, and time.  Skin: Skin is warm and dry. No rash noted.   ED Course   Procedures (including critical care time)  EKG  Date: 05/13/2013  Rate: 78  Rhythm: normal sinus rhythm  QRS Axis: normal  Intervals: normal  ST/T Wave abnormalities: normal  Conduction Disutrbances: none  Old EKG Reviewed: No significant changes noted  Labs Reviewed  COMPREHENSIVE METABOLIC PANEL - Abnormal; Notable for the following:    Glucose, Bld 130 (*)    Total Bilirubin 0.2 (*)    GFR calc non Af Amer 56 (*)    GFR calc Af Amer 65 (*)    All other components within normal limits  CBC  PRO B NATRIURETIC PEPTIDE  URINALYSIS, ROUTINE W REFLEX MICROSCOPIC  CBC  POCT I-STAT TROPONIN I   Dg Chest 2 View  05/13/2013   *RADIOLOGY REPORT*  Clinical Data: Chest pain  CHEST - 2 VIEW  Comparison:  April 27, 2013  Findings:  There is no edema or consolidation.  The heart size and pulmonary vascularity are normal.  No adenopathy.  There is degenerative change in the thoracic spine.  IMPRESSION:  No edema or consolidation.   Original Report Authenticated By: Bretta Bang, M.D.   1. Chest pain     MDM  72 year old female with HTN, Prior CVA, and Anxiety/Depression who presents with chest pain. - CMP, Troponin, and Chest xray negative (see above). - Given PMH/risk factors, will have cardiology see patient.  1730 - Cardiology recommending additional troponin.  If negative patient to follow up with them for outpatient stress test.   1810 - Troponin negative.  Will discharge home.  Tommie Sams, DO 05/13/13 1813

## 2013-05-13 NOTE — ED Notes (Signed)
Patient transported to X-ray 

## 2013-05-13 NOTE — ED Notes (Signed)
Pt was given 1 sl nitro which brought pain from 5/10 to 3/10 and then pain free.   Pt was also given 324mg  aspirin po by ems en route

## 2013-05-13 NOTE — ED Notes (Signed)
Pt began having substernal chest pain while walking around [employer].  No SOB with this.  Pt denies any n/v or other complaints.  Pt recently began taking HCTZ.  Pt is alert and oriented.

## 2013-06-09 ENCOUNTER — Ambulatory Visit: Payer: Medicare Other | Admitting: Cardiology

## 2013-06-10 ENCOUNTER — Ambulatory Visit (INDEPENDENT_AMBULATORY_CARE_PROVIDER_SITE_OTHER): Payer: Medicare Other | Admitting: Cardiology

## 2013-06-10 ENCOUNTER — Encounter: Payer: Self-pay | Admitting: Cardiology

## 2013-06-10 VITALS — BP 146/80 | HR 69 | Ht 66.5 in | Wt 264.0 lb

## 2013-06-10 DIAGNOSIS — R079 Chest pain, unspecified: Secondary | ICD-10-CM

## 2013-06-10 DIAGNOSIS — R0600 Dyspnea, unspecified: Secondary | ICD-10-CM

## 2013-06-10 DIAGNOSIS — R3129 Other microscopic hematuria: Secondary | ICD-10-CM | POA: Insufficient documentation

## 2013-06-10 DIAGNOSIS — R072 Precordial pain: Secondary | ICD-10-CM

## 2013-06-10 DIAGNOSIS — R0609 Other forms of dyspnea: Secondary | ICD-10-CM | POA: Insufficient documentation

## 2013-06-10 DIAGNOSIS — I119 Hypertensive heart disease without heart failure: Secondary | ICD-10-CM

## 2013-06-10 NOTE — Assessment & Plan Note (Signed)
The patient has a history of dyspnea on exertion.  She frequently has to stop and rest while walking back from her mailbox to her home.  The patient has had unexplained recent weight gain.  The previous pedal edema noted in the emergency room has resolved and may have been related to amlodipine.

## 2013-06-10 NOTE — Patient Instructions (Signed)
Your physician recommends that you continue on your current medications as directed. Please refer to the Current Medication list given to you today.  Your physician has requested that you have a lexiscan myoview. For further information please visit [email or web address] Please follow instruction sheet, as given. We will call you with the results  Follow up as needed

## 2013-06-10 NOTE — Progress Notes (Signed)
Kathryn Thomas Date of Birth:  12-01-1940 [facility] [address] [place], [place]  [address] [phone]         Fax   [phone]  History of Present Illness:  This pleasant 72 year old woman is seen for a post emergency room followup office visit.  She had been seen in the [facility] emergency room on 05/13/13 because of chest pain and peripheral edema.  She had a history of hypertension and anxiety and depression.  On the day of her emergency room visit she had developed severe sudden sharp nonradiating midsternal discomfort and tightness while walking to the grocery store.  911 was called.  The pain resolved after being given aspirin and sublingual nitroglycerin.  The patient ruled out for myocardial damage by enzymes and her electrocardiogram was nonacute and she was discharged home from the emergency room and follows up now.  Since last seen in the emergency room a month ago she has had 2 further episodes of recurrence of chest pain once while driving her car to her daughter's house and the second time while walking from her mailbox back to her home. The patient is a never smoker.  She does have a positive family history of heart problems.  Her mother died of congestive heart failure and her father died of a heart attack and she has 2 living sisters both of whom have some type of heart problem.  Current Outpatient Prescriptions  Medication Sig Dispense Refill  . ALPRAZolam (XANAX) 0.25 MG tablet Take 0.25 mg by mouth 2 (two) times daily.      Marland Kitchen buPROPion (WELLBUTRIN SR) 150 MG 12 hr tablet Take 150 mg by mouth 2 (two) times daily.      . Cyanocobalamin (VITAMIN B-12 IJ) Inject 1 mL as directed every 30 (thirty) days.      . hydrochlorothiazide (HYDRODIURIL) 12.5 MG tablet 12.5 mg daily. Take daily for swelling      . lisinopril (PRINIVIL,ZESTRIL) 10 MG tablet Take 1 tablet (10 mg total) by mouth daily.  30 tablet  3  . Vitamin D, Ergocalciferol, (DRISDOL) 50000  UNITS CAPS capsule Take 50,000 Units by mouth every 7 (seven) days. On Wednesday.       No current facility-administered medications for this visit.    No Known Allergies  Patient Active Problem List   Diagnosis Date Noted  . Precordial pain 05/13/2013  . Anal stenosis   . Pernicious anemia 10/01/2011  . Multiple gastric polyps 10/01/2011  . B12 deficiency anemia 10/01/2011  . Anemia 09/28/2011  . Weight loss 09/28/2011  . Positional lightheadedness 09/28/2011  . UTI (lower urinary tract infection) 09/28/2011    History  Smoking status  . Never Smoker   Smokeless tobacco  . Never Used    History  Alcohol Use No    Family History  Problem Relation Age of Onset  . Cervical cancer Mother   . Heart disease Father     died at 62 of heart attack  . Multiple myeloma Sister     spleen involvement  . Cancer Sister     bone marrow  . Stroke Sister   . Heart disease Mother     died of CHF  . Colon cancer Neg Hx     Review of Systems: Constitutional: no fever chills diaphoresis or fatigue or change in weight.  Head and neck: no hearing loss, no epistaxis, no photophobia or visual disturbance. Respiratory: No cough, shortness of breath or wheezing. Cardiovascular: No  chest pain peripheral edema, palpitations. Gastrointestinal: No abdominal distention, no abdominal pain, no change in bowel habits hematochezia or melena. Genitourinary: No dysuria, no frequency, no urgency, no nocturia. Musculoskeletal:No arthralgias, no back pain, no gait disturbance or myalgias. Neurological: No dizziness, no headaches, no numbness, no seizures, no syncope, no weakness, no tremors. Hematologic: No lymphadenopathy, no easy bruising. Psychiatric: No confusion, no hallucinations, no sleep disturbance.    Physical Exam: Filed Vitals:   06/10/13 1449  BP: 146/80  Pulse: 69   the general appearance reveals a well-developed well-nourished large woman in no distress.The head and neck exam  reveals pupils equal and reactive.  Extraocular movements are full.  There is no scleral icterus.  The mouth and pharynx are normal.  The neck is supple.  The carotids reveal no bruits.  The jugular venous pressure is normal.  The  thyroid is not enlarged.  There is no lymphadenopathy.  The chest is clear to percussion and auscultation.  There are no rales or rhonchi.  Expansion of the chest is symmetrical.  The precordium is quiet.  The first heart sound is normal.  The second heart sound is physiologically split.  There is no murmur gallop rub or click.  There is no abnormal lift or heave.  The abdomen is soft and nontender.  The bowel sounds are normal.  The liver and spleen are not enlarged.  There are no abdominal masses.  There are no abdominal bruits.  Extremities reveal good pedal pulses.  There is no phlebitis or edema.  There is no cyanosis or clubbing.  Strength is normal and symmetrical in all extremities.  There is no lateralizing weakness.  There are no sensory deficits.  The skin is warm and dry.  There is no rash.  EKG today shows no sinus rhythm and is within normal limits at rest   Assessment / Plan: The etiology of her chest pain is not clear at this point.  She has multiple risk factors for ischemic heart disease.  We will have her return for a 2 day Lexa scan Myoview. Return here when necessary

## 2013-06-10 NOTE — Assessment & Plan Note (Signed)
The patient has microscopic hematuria.  This is being evaluated by urology.  She is not having any active genitourinary symptoms at this time.

## 2013-06-10 NOTE — Assessment & Plan Note (Signed)
The patient has had episodes of precordial chest discomfort.  She does not have any prior history of known ischemic heart disease but does have multiple risk factors.  We will have her return for a 2 day Lexa scan Myoview

## 2013-06-23 ENCOUNTER — Other Ambulatory Visit: Payer: Self-pay | Admitting: Family Medicine

## 2013-07-07 ENCOUNTER — Encounter (HOSPITAL_COMMUNITY): Payer: Medicare Other

## 2013-07-22 ENCOUNTER — Encounter (HOSPITAL_COMMUNITY): Payer: Medicare Other

## 2013-07-24 ENCOUNTER — Other Ambulatory Visit: Payer: Self-pay | Admitting: Physician Assistant

## 2013-07-29 ENCOUNTER — Other Ambulatory Visit: Payer: Self-pay

## 2013-07-29 DIAGNOSIS — E559 Vitamin D deficiency, unspecified: Secondary | ICD-10-CM

## 2013-07-29 DIAGNOSIS — D510 Vitamin B12 deficiency anemia due to intrinsic factor deficiency: Secondary | ICD-10-CM

## 2013-07-29 NOTE — Telephone Encounter (Signed)
Called and LMOM.  Per my notes we last checked her vitamin D it was normal.  I was not aware she was taking a 50K vit D supplement; she has been doing this for some time.  She was not sure if she could stop or not. She will come by clinic for a d level (lab only) and then I will follow-up.  Suspect we can change to an OTC vitamin d supplement of 1,000 or 2,000 IU a day

## 2013-07-29 NOTE — Telephone Encounter (Signed)
Pharmacy requests RF of vit D from Dr Patsy Lager. Dr Patsy Lager, you recently saw pt but not for this med/Dx. I don't see that you have Rxd this for pt before. I have pended it for 6 mos of RFs for your review, but wanted to make sure you want to Rx for pt.

## 2013-08-09 ENCOUNTER — Ambulatory Visit (HOSPITAL_COMMUNITY): Payer: Medicare Other | Attending: Cardiology | Admitting: Radiology

## 2013-08-09 VITALS — BP 132/77 | Ht 66.5 in | Wt 266.0 lb

## 2013-08-09 DIAGNOSIS — R0602 Shortness of breath: Secondary | ICD-10-CM

## 2013-08-09 DIAGNOSIS — I119 Hypertensive heart disease without heart failure: Secondary | ICD-10-CM

## 2013-08-09 DIAGNOSIS — R0609 Other forms of dyspnea: Secondary | ICD-10-CM | POA: Insufficient documentation

## 2013-08-09 DIAGNOSIS — R0600 Dyspnea, unspecified: Secondary | ICD-10-CM

## 2013-08-09 DIAGNOSIS — R079 Chest pain, unspecified: Secondary | ICD-10-CM | POA: Insufficient documentation

## 2013-08-09 DIAGNOSIS — I10 Essential (primary) hypertension: Secondary | ICD-10-CM | POA: Insufficient documentation

## 2013-08-09 DIAGNOSIS — I2510 Atherosclerotic heart disease of native coronary artery without angina pectoris: Secondary | ICD-10-CM | POA: Insufficient documentation

## 2013-08-09 DIAGNOSIS — R0989 Other specified symptoms and signs involving the circulatory and respiratory systems: Secondary | ICD-10-CM | POA: Insufficient documentation

## 2013-08-09 DIAGNOSIS — Z8249 Family history of ischemic heart disease and other diseases of the circulatory system: Secondary | ICD-10-CM | POA: Insufficient documentation

## 2013-08-09 MED ORDER — REGADENOSON 0.4 MG/5ML IV SOLN
0.4000 mg | Freq: Once | INTRAVENOUS | Status: AC
  Administered 2013-08-09: 0.4 mg via INTRAVENOUS

## 2013-08-09 MED ORDER — TECHNETIUM TC 99M SESTAMIBI GENERIC - CARDIOLITE
33.0000 | Freq: Once | INTRAVENOUS | Status: AC | PRN
  Administered 2013-08-09: 33 via INTRAVENOUS

## 2013-08-09 NOTE — Progress Notes (Signed)
[facility] SITE 3 NUCLEAR MED [address] [place], [place] [address] [phone]    Cardiology Nuclear Med Study  Kathryn Thomas is a 72 y.o. female     MRN : [id number]     DOB: 19-Jul-1941  Procedure Date: 08/09/2013  Nuclear Med Background Indication for Stress Test:  Evaluation for Ischemia History:  CAD Cardiac Risk Factors: Family History - CAD and Hypertension  Symptoms:  Chest Pain, DOE    Nuclear Pre-Procedure Caffeine/Decaff Intake:  None NPO After: 9:30pm   Lungs:  clear O2 Sat: 96% on room air. IV 0.9% NS with Angio Cath:  22g  IV Site: R Hand  IV Started by:  Bonnita Levan, RN  Chest Size (in):  38 Cup Size: C  Height: 5' 6.5" (1.689 m)  Weight:  266 lb (120.657 kg)  BMI:  Body mass index is 42.3 kg/(m^2). Tech Comments:  N/A    Nuclear Med Study 1 or 2 day study: 2 day  Stress Test Type:  Lexiscan  Reading MD: Tobias Alexander, MD  Order Authorizing Provider:  Cassell Clement, MD  Resting Radionuclide: Technetium 42m Sestamibi  Resting Radionuclide Dose: 33.0 mCi on 08-12-13  Stress Radionuclide:  Technetium 64m Sestamibi  Stress Radionuclide Dose: 33.0 mCi on 08-09-13          Stress Protocol Rest HR: 65 Stress HR: 82  Rest BP: 132/77 Stress BP: 147/60  Exercise Time (min): n/a METS: n/a   Predicted Max HR: 147 bpm % Max HR: 55.78 bpm Rate Pressure Product: 21308   Dose of Adenosine (mg):  n/a Dose of Lexiscan: 0.4 mg  Dose of Atropine (mg): n/a Dose of Dobutamine: n/a mcg/kg/min (at max HR)  Stress Test Technologist: Frederick Peers, EMT-P  Nuclear Technologist:  Harlow Asa, CNMT     Rest Procedure:  Myocardial perfusion imaging was performed at rest 45 minutes following the intravenous administration of Technetium 70m Sestamibi. Rest ECG: NSR with non-specific ST-T wave changes  Stress Procedure:  The patient received IV Lexiscan 0.4 mg over 15-seconds.  Technetium 44m Sestamibi injected at 30-seconds.  Quantitative spect  images were obtained after a 45 minute delay. Stress ECG: No significant change from baseline ECG  QPS Raw Data Images:  Normal; no motion artifact; normal heart/lung ratio. Stress Images:  Normal homogeneous uptake in all areas of the myocardium. Rest Images:  Normal homogeneous uptake in all areas of the myocardium. Subtraction (SDS):  Normal Transient Ischemic Dilatation (Normal <1.22):  1.08 Lung/Heart Ratio (Normal <0.45):  0.27  Quantitative Gated Spect Images QGS EDV:  112 ml QGS ESV:  39 ml  Impression Exercise Capacity:  Lexiscan with no exercise. BP Response:  Normal blood pressure response. Clinical Symptoms:  Lightheaded ECG Impression:  No significant ST segment change suggestive of ischemia. Comparison with Prior Nuclear Study: No images to compare  Overall Impression:  Normal stress nuclear study.  LV Ejection Fraction: 65%.  LV Wall Motion:  NL LV Function; NL Wall Motion   Charlton Haws

## 2013-08-12 ENCOUNTER — Encounter: Payer: Self-pay | Admitting: Cardiovascular Disease

## 2013-08-12 ENCOUNTER — Ambulatory Visit (HOSPITAL_COMMUNITY): Payer: Medicare Other | Attending: Cardiovascular Disease | Admitting: Radiology

## 2013-08-12 DIAGNOSIS — R0989 Other specified symptoms and signs involving the circulatory and respiratory systems: Secondary | ICD-10-CM

## 2013-08-12 MED ORDER — TECHNETIUM TC 99M SESTAMIBI GENERIC - CARDIOLITE
33.0000 | Freq: Once | INTRAVENOUS | Status: AC | PRN
  Administered 2013-08-12: 33 via INTRAVENOUS

## 2013-08-13 ENCOUNTER — Telehealth: Payer: Self-pay | Admitting: Cardiology

## 2013-08-13 NOTE — Telephone Encounter (Signed)
New Problem:  Pt states she is returning Pam's call.

## 2013-08-13 NOTE — Telephone Encounter (Signed)
Pt aware of nuc study results

## 2013-08-27 ENCOUNTER — Other Ambulatory Visit: Payer: Self-pay | Admitting: Family Medicine

## 2013-08-27 DIAGNOSIS — F411 Generalized anxiety disorder: Secondary | ICD-10-CM

## 2013-08-28 ENCOUNTER — Other Ambulatory Visit: Payer: Self-pay | Admitting: Family Medicine

## 2013-08-28 NOTE — Telephone Encounter (Signed)
Last filled 8/5

## 2013-09-30 ENCOUNTER — Other Ambulatory Visit: Payer: Self-pay | Admitting: Family Medicine

## 2013-09-30 ENCOUNTER — Other Ambulatory Visit: Payer: Self-pay | Admitting: Physician Assistant

## 2013-09-30 DIAGNOSIS — F411 Generalized anxiety disorder: Secondary | ICD-10-CM

## 2013-10-01 ENCOUNTER — Other Ambulatory Visit: Payer: Self-pay | Admitting: Family Medicine

## 2013-10-01 NOTE — Telephone Encounter (Signed)
Pt needs appt. Has not been in since August 2014. Please advise refill.

## 2013-10-01 NOTE — Telephone Encounter (Signed)
Last office visit in August 2014. Please advise refill.

## 2013-11-01 ENCOUNTER — Other Ambulatory Visit: Payer: Self-pay | Admitting: Physician Assistant

## 2013-11-01 ENCOUNTER — Other Ambulatory Visit: Payer: Self-pay | Admitting: Family Medicine

## 2013-11-01 DIAGNOSIS — F411 Generalized anxiety disorder: Secondary | ICD-10-CM

## 2013-11-03 ENCOUNTER — Other Ambulatory Visit: Payer: Self-pay | Admitting: Family Medicine

## 2013-12-01 ENCOUNTER — Other Ambulatory Visit: Payer: Self-pay | Admitting: Physician Assistant

## 2013-12-28 ENCOUNTER — Ambulatory Visit (INDEPENDENT_AMBULATORY_CARE_PROVIDER_SITE_OTHER): Payer: Medicare Other | Admitting: Internal Medicine

## 2013-12-28 VITALS — BP 140/82 | HR 80 | Temp 98.2°F | Resp 16 | Ht 66.5 in | Wt 266.6 lb

## 2013-12-28 DIAGNOSIS — M5137 Other intervertebral disc degeneration, lumbosacral region: Secondary | ICD-10-CM

## 2013-12-28 DIAGNOSIS — M545 Low back pain, unspecified: Secondary | ICD-10-CM

## 2013-12-28 DIAGNOSIS — J309 Allergic rhinitis, unspecified: Secondary | ICD-10-CM

## 2013-12-28 DIAGNOSIS — M5136 Other intervertebral disc degeneration, lumbar region: Secondary | ICD-10-CM

## 2013-12-28 DIAGNOSIS — I10 Essential (primary) hypertension: Secondary | ICD-10-CM

## 2013-12-28 DIAGNOSIS — R739 Hyperglycemia, unspecified: Secondary | ICD-10-CM

## 2013-12-28 DIAGNOSIS — R7309 Other abnormal glucose: Secondary | ICD-10-CM

## 2013-12-28 DIAGNOSIS — E538 Deficiency of other specified B group vitamins: Secondary | ICD-10-CM

## 2013-12-28 DIAGNOSIS — M51369 Other intervertebral disc degeneration, lumbar region without mention of lumbar back pain or lower extremity pain: Secondary | ICD-10-CM

## 2013-12-28 LAB — GLUCOSE, POCT (MANUAL RESULT ENTRY): POC Glucose: 127 mg/dl — AB (ref 70–99)

## 2013-12-28 LAB — BASIC METABOLIC PANEL
BUN: 15 mg/dL (ref 6–23)
CO2: 27 mEq/L (ref 19–32)
Calcium: 8.8 mg/dL (ref 8.4–10.5)
Chloride: 104 mEq/L (ref 96–112)
Creat: 1 mg/dL (ref 0.50–1.10)
Glucose, Bld: 128 mg/dL — ABNORMAL HIGH (ref 70–99)
Potassium: 4.8 mEq/L (ref 3.5–5.3)
Sodium: 139 mEq/L (ref 135–145)

## 2013-12-28 LAB — POCT GLYCOSYLATED HEMOGLOBIN (HGB A1C): Hemoglobin A1C: 5.8

## 2013-12-28 MED ORDER — IBUPROFEN 600 MG PO TABS: 600.0000 mg | ORAL_TABLET | Freq: Three times a day (TID) | ORAL | Status: DC | PRN

## 2013-12-28 MED ORDER — LISINOPRIL 10 MG PO TABS: 10.0000 mg | ORAL_TABLET | Freq: Every day | ORAL | Status: DC

## 2013-12-28 MED ORDER — FLUTICASONE PROPIONATE 50 MCG/ACT NA SUSP: 2.0000 | Freq: Every day | NASAL | Status: DC

## 2013-12-28 NOTE — Patient Instructions (Signed)
Back Pain, Adult °Low back pain is very common. About 1 in 5 people have back pain. The cause of low back pain is rarely dangerous. The pain often gets better over time. About half of people with a sudden onset of back pain feel better in just 2 weeks. About 8 in 10 people feel better by 6 weeks.  °CAUSES °Some common causes of back pain include: °· Strain of the muscles or ligaments supporting the spine. °· Wear and tear (degeneration) of the spinal discs. °· Arthritis. °· Direct injury to the back. °DIAGNOSIS °Most of the time, the direct cause of low back pain is not known. However, back pain can be treated effectively even when the exact cause of the pain is unknown. Answering your caregiver's questions about your overall health and symptoms is one of the most accurate ways to make sure the cause of your pain is not dangerous. If your caregiver needs more information, he or she may order lab work or imaging tests (X-rays or MRIs). However, even if imaging tests show changes in your back, this usually does not require surgery. °HOME CARE INSTRUCTIONS °For many people, back pain returns. Since low back pain is rarely dangerous, it is often a condition that people can learn to manage on their own.  °· Remain active. It is stressful on the back to sit or stand in one place. Do not sit, drive, or stand in one place for more than 30 minutes at a time. Take short walks on level surfaces as soon as pain allows. Try to increase the length of time you walk each day. °· Do not stay in bed. Resting more than 1 or 2 days can delay your recovery. °· Do not avoid exercise or work. Your body is made to move. It is not dangerous to be active, even though your back may hurt. Your back will likely heal faster if you return to being active before your pain is gone. °· Pay attention to your body when you  bend and lift. Many people have less discomfort when lifting if they bend their knees, keep the load close to their bodies, and  avoid twisting. Often, the most comfortable positions are those that put less stress on your recovering back. °· Find a comfortable position to sleep. Use a firm mattress and lie on your side with your knees slightly bent. If you lie on your back, put a pillow under your knees. °· Only take over-the-counter or prescription medicines as directed by your caregiver. Over-the-counter medicines to reduce pain and inflammation are often the most helpful. Your caregiver may prescribe muscle relaxant drugs. These medicines help dull your pain so you can more quickly return to your normal activities and healthy exercise. °· Put ice on the injured area. °· Put ice in a plastic bag. °· Place a towel between your skin and the bag. °· Leave the ice on for 15-20 minutes, 03-04 times a day for the first 2 to 3 days. After that, ice and heat may be alternated to reduce pain and spasms. °· Ask your caregiver about trying back exercises and gentle massage. This may be of some benefit. °· Avoid feeling anxious or stressed. Stress increases muscle tension and can worsen back pain. It is important to recognize when you are anxious or stressed and learn ways to manage it. Exercise is a great option. °SEEK MEDICAL CARE IF: °· You have pain that is not relieved with rest or medicine. °· You have pain that does not improve in 1 week. °· You have new symptoms. °· You are generally not feeling well. °SEEK   IMMEDIATE MEDICAL CARE IF:   You have pain that radiates from your back into your legs.  You develop new bowel or bladder control problems.  You have unusual weakness or numbness in your arms or legs.  You develop nausea or vomiting.  You develop abdominal pain.  You feel faint. Document Released: 09/09/2005 Document Revised: 03/10/2012 Document Reviewed: 01/28/2011 [facility] Patient Information 2014 [place], [place]. Back Injury Prevention Back injuries can be extremely painful and difficult to heal. After having one back  injury, you are much more likely to experience another later on. It is important to learn how to avoid injuring or re-injuring your back. The following tips can help you to prevent a back injury. PHYSICAL FITNESS  Exercise regularly and try to develop good tone in your abdominal muscles. Your abdominal muscles provide a lot of the support needed by your back.  Do aerobic exercises (walking, jogging, biking, swimming) regularly.  Do exercises that increase balance and strength (tai chi, yoga) regularly. This can decrease your risk of falling and injuring your back.  Stretch before and after exercising.  Maintain a healthy weight. The more you weigh, the more stress is placed on your back. For every pound of weight, 10 times that amount of pressure is placed on the back. DIET  Talk to your caregiver about how much calcium and vitamin D you need per day. These nutrients help to prevent weakening of the bones (osteoporosis). Osteoporosis can cause broken (fractured) bones that lead to back pain.  Include good sources of calcium in your diet, such as dairy products, green, leafy vegetables, and products with calcium added (fortified).  Include good sources of vitamin D in your diet, such as milk and foods that are fortified with vitamin D.  Consider taking a nutritional supplement or a multivitamin if needed.  Stop smoking if you smoke. POSTURE  Sit and stand up straight. Avoid leaning forward when you sit or hunching over when you stand.  Choose chairs with good low back (lumbar) support.  If you work at a desk, sit close to your work so you do not need to lean over. Keep your chin tucked in. Keep your neck drawn back and elbows bent at a right angle. Your arms should look like the letter "L."  Sit high and close to the steering wheel when you drive. Add a lumbar support to your car seat if needed.  Avoid sitting or standing in one position for too long. Take breaks to get up, stretch,  and walk around at least once every hour. Take breaks if you are driving for long periods of time.  Sleep on your side with your knees slightly bent, or sleep on your back with a pillow under your knees. Do not sleep on your stomach. LIFTING, TWISTING, AND REACHING  Avoid heavy lifting, especially repetitive lifting. If you must do heavy lifting:  Stretch before lifting.  Work slowly.  Rest between lifts.  Use carts and dollies to move objects when possible.  Make several small trips instead of carrying 1 heavy load.  Ask for help when you need it.  Ask for help when moving big, awkward objects.  Follow these steps when lifting:  Stand with your feet shoulder-width apart.  Get as close to the object as you can. Do not try to pick up heavy objects that are far from your body.  Use handles or lifting straps if they are available.  Bend at your knees. Squat down, but keep your  heels off the floor.  Keep your shoulders pulled back, your chin tucked in, and your back straight.  Lift the object slowly, tightening the muscles in your legs, abdomen, and buttocks. Keep the object as close to the center of your body as possible.  When you put a load down, use these same guidelines in reverse.  Do not:  Lift the object above your waist.  Twist at the waist while lifting or carrying a load. Move your feet if you need to turn, not your waist.  Bend over without bending at your knees.  Avoid reaching over your head, across a table, or for an object on a high surface. OTHER TIPS  Avoid wet floors and keep sidewalks clear of ice to prevent falls.  Do not sleep on a mattress that is too soft or too hard.  Keep items that are used frequently within easy reach.  Put heavier objects on shelves at waist level and lighter objects on lower or higher shelves.  Find ways to decrease your stress, such as exercise, massage, or relaxation techniques. Stress can build up in your muscles.  Tense muscles are more vulnerable to injury.  Seek treatment for depression or anxiety if needed. These conditions can increase your risk of developing back pain. SEEK MEDICAL CARE IF:  You injure your back.  You have questions about diet, exercise, or other ways to prevent back injuries. MAKE SURE YOU:  Understand these instructions.  Will watch your condition.  Will get help right away if you are not doing well or get worse. Document Released: 10/17/2004 Document Revised: 12/02/2011 Document Reviewed: 10/21/2011 [facility] Patient Information 2014 [place], [place].

## 2013-12-28 NOTE — Progress Notes (Signed)
Subjective:    Patient ID: Kathryn Thomas, female    DOB: 1941/07/26, 73 y.o.   MRN: [id number]  HPI Kathryn Thomas presents today with back pain in which she has had four surgeries. She has hx of DDD. She is unsure which surgeries they were because they'd taken place so long ago. There is no injury or prior.  She states she has been taking 800 mg ibprophen which usually works but has lost it's effects here lately. Her pain resides in her lower back on the left side. She states it doesn't however run down into her leg. Her pain has been present for a week. Kathryn Thomas states that her pain is an ache and not sharp. She denies any urinary or bowel incontinence as well as numbness or tingling. Kathryn Thomas also has sinus issues that cause her eyes and nose to run. The nasal drainage is clear with thin consistency. This issue has been apparent for a couple weeks. She attributes this to seasonal allergies which she has not taken any medication. She has no pets. She denies fever, headache, nausea, and vomiting. She has no further complaints at this time.    Review of Systems See problem list/ Had B12 shot at home this month.     Objective:   Physical Exam  Constitutional: She is oriented to person, place, and time. She appears well-developed and well-nourished. No distress.  HENT:  Head: Normocephalic.  Right Ear: External ear normal.  Left Ear: External ear normal.  Nose: Mucosal edema, rhinorrhea and sinus tenderness present. Right sinus exhibits no maxillary sinus tenderness and no frontal sinus tenderness. Left sinus exhibits no maxillary sinus tenderness and no frontal sinus tenderness.  Mouth/Throat: Oropharynx is clear and moist.  Eyes: Conjunctivae and EOM are normal. Pupils are equal, round, and reactive to light. No scleral icterus.  Neck: Normal range of motion. Neck supple.  Cardiovascular: Normal rate, regular rhythm and normal heart sounds.   Pulmonary/Chest: Effort normal and breath sounds normal.    Musculoskeletal: She exhibits tenderness.       Lumbar back: She exhibits decreased range of motion, tenderness and pain. She exhibits no bony tenderness, no swelling, no edema, no deformity, no laceration and normal pulse.  Neurological: She is alert and oriented to person, place, and time. She has normal reflexes. No cranial nerve deficit. She exhibits normal muscle tone. Coordination normal.  Psychiatric: She has a normal mood and affect. Her behavior is normal. Judgment and thought content normal.    She added as leaving she was out of BP meds. On chart review she has multiple lab needs.  Results for orders placed in visit on 26/71/24  BASIC METABOLIC PANEL      Result Value Ref Range   Sodium 139  135 - 145 mEq/L   Potassium 4.8  3.5 - 5.3 mEq/L   Chloride 104  96 - 112 mEq/L   CO2 27  19 - 32 mEq/L   Glucose, Bld 128 (*) 70 - 99 mg/dL   BUN 15  6 - 23 mg/dL   Creat 1.00  0.50 - 1.10 mg/dL   Calcium 8.8  8.4 - 10.5 mg/dL  GLUCOSE, POCT (MANUAL RESULT ENTRY)      Result Value Ref Range   POC Glucose 127 (*) 70 - 99 mg/dl  POCT GLYCOSYLATED HEMOGLOBIN (HGB A1C)      Result Value Ref Range   Hemoglobin A1C 5.8         Assessment & Plan:  LBP  and DDD Allergic rhinitis HTN/RF lisinopril

## 2013-12-28 NOTE — Progress Notes (Signed)
   Subjective:    Patient ID: Kathryn Thomas, female    DOB: Feb 10, 1941, 73 y.o.   MRN: [id number]  HPI    Review of Systems     Objective:   Physical Exam        Assessment & Plan:

## 2014-01-01 ENCOUNTER — Encounter: Payer: Self-pay | Admitting: Radiology

## 2014-02-10 ENCOUNTER — Other Ambulatory Visit: Payer: Self-pay | Admitting: Family Medicine

## 2014-02-11 ENCOUNTER — Telehealth: Payer: Self-pay | Admitting: *Deleted

## 2014-02-11 NOTE — Telephone Encounter (Signed)
Message copied by Delany Steury, [facility] on Fri Feb 11, 2014  4:40 PM ------      Message from: [place], [place] G      Created: Thu Oct 10, 2011  2:51 PM      Regarding: Vit B12 injections       Any word on getting Vit B12 for patient's injections at home? ------

## 2014-02-20 ENCOUNTER — Ambulatory Visit (INDEPENDENT_AMBULATORY_CARE_PROVIDER_SITE_OTHER): Payer: Medicare Other | Admitting: Family Medicine

## 2014-02-20 VITALS — BP 142/76 | HR 67 | Temp 98.6°F | Resp 20 | Ht 66.0 in | Wt 266.2 lb

## 2014-02-20 DIAGNOSIS — F411 Generalized anxiety disorder: Secondary | ICD-10-CM

## 2014-02-20 DIAGNOSIS — E538 Deficiency of other specified B group vitamins: Secondary | ICD-10-CM

## 2014-02-20 DIAGNOSIS — F4310 Post-traumatic stress disorder, unspecified: Secondary | ICD-10-CM

## 2014-02-20 MED ORDER — ALPRAZOLAM 0.25 MG PO TABS: 0.2500 mg | ORAL_TABLET | Freq: Two times a day (BID) | ORAL | Status: DC | PRN

## 2014-02-20 MED ORDER — BUPROPION HCL ER (SR) 150 MG PO TB12: 150.0000 mg | ORAL_TABLET | Freq: Two times a day (BID) | ORAL | Status: DC

## 2014-02-20 MED ORDER — CYANOCOBALAMIN 1000 MCG/ML IJ SOLN
1000.0000 ug | Freq: Once | INTRAMUSCULAR | Status: AC
  Administered 2014-02-20: 1000 ug via INTRAMUSCULAR

## 2014-02-20 NOTE — Patient Instructions (Signed)
Grief Reaction  Grief is a normal response to the death of someone close to you. Feelings of fear, anger, and guilt can affect almost everyone who loses someone they love. Symptoms of depression are also common. These include problems with sleep, loss of appetite, and lack of energy. These grief reaction symptoms often last for weeks to months after a loss. They may also return during special times that remind you of the person you lost, such as an anniversary or birthday.  Anxiety, insomnia, irritability, and deep depression may last beyond the period of normal grief. If you experience these feelings for 6 months or longer, you may have clinical depression. Clinical depression requires further medical attention. If you think that you have clinical depression, you should contact your caregiver. If you have a history of depression and or a family history of depression, you are at greater risk of clinical depression. You are also at greater risk of developing clinical depression if the loss was traumatic or the loss was of someone with whom you had unresolved issues.   A grief reaction can become complicated by being blocked. This means being unable to cry or express extreme emotions. This may prolong the grieving period and worsen the emotional effects of the loss. Mourning is a natural event in human life. A healthy grief reaction is one that is not blocked . It requires a time of sadness and readjustment.It is very important to share your sorrow and fear with others, especially close friends and family. Professional counselors and clergy can also help you process your grief.  Document Released: 09/09/2005 Document Revised: 12/02/2011 Document Reviewed: 05/20/2006  [employer] Patient Information 2014 [employer].

## 2014-02-20 NOTE — Progress Notes (Signed)
Subjective:    Patient ID: Kathryn Thomas, female    DOB: Feb 09, 1941, 73 y.o.   MRN: [id number]  Anxiety Symptoms include nervous/anxious behavior.     Chief Complaint  Patient presents with   Anxiety    anxiety is not better.  needs a b12 shot today.    This chart was scribed for Robyn Haber, MD by Thea Alken, ED Scribe. This patient was seen in room 10 and the patient's care was started at 3:37 PM.  HPI Comments: Kathryn Thomas is a 73 y.o. female who presents to the [facility] complaining of anxiety. Pt reports her husband past away 8 years ago after being married for 37 years. She reports her husband had DM and died 2 weeks after being diagnosed with stage 4 pancreatic cancer. She reports she unable to get over it and that she thinks about him every day. Pt states she went to grief counseling shortly after the death but states it was not enough. She reports she is currrently out of xanax and Wellbutrin in which she takes both twice a day. Pt hs been out of the medication for 3 days.   She has two daughters less than 2 miles away.  Past Medical History  Diagnosis Date   Pernicious anemia    Shortness of breath    Blood transfusion     " no reaction to transfusion "   Intestinal metaplasia of gastric mucosa 09/2011    high grade dysplasia   Gastritis    Vitamin B12 deficiency    Acute renal failure    Gastric polyp    Anal stenosis 2010   External hemorrhoids    Anxiety    Depression    Hypertension    No Known Allergies Prior to Admission medications   Medication Sig Start Date End Date Taking? Authorizing Provider  ALPRAZolam (XANAX) 0.25 MG tablet TAKE ONE TABLET BY MOUTH TWICE DAILY AS NEEDED FOR ANXIETY   Yes Gay Filler Copland, MD  buPROPion (WELLBUTRIN SR) 150 MG 12 hr tablet Take 150 mg by mouth 2 (two) times daily.   Yes Historical Provider, MD  buPROPion (WELLBUTRIN SR) 150 MG 12 hr tablet Take 1 tablet (150 mg  total) by mouth 2 (two) times daily. PATIENT NEEDS OFFICE VISIT FOR ADDITIONAL REFILLS   Yes Jessica C Copland, MD  cyanocobalamin (,VITAMIN B-12,) 1000 MCG/ML injection INJECT 1ML INTO THE MUSCLE EVERY 30 DAYS 07/24/13  Yes Jessica C Copland, MD  Cyanocobalamin (VITAMIN B-12 IJ) Inject 1 mL as directed every 30 (thirty) days.   Yes Historical Provider, MD  fluticasone (FLONASE) 50 MCG/ACT nasal spray Place 2 sprays into both nostrils daily. 12/28/13  Yes Orma Flaming, MD  hydrochlorothiazide (HYDRODIURIL) 12.5 MG tablet 12.5 mg daily. Take daily for swelling 05/05/13  Yes Gay Filler Copland, MD  ibuprofen (ADVIL,MOTRIN) 600 MG tablet Take 1 tablet (600 mg total) by mouth every 8 (eight) hours as needed. 12/28/13  Yes Orma Flaming, MD  lisinopril (PRINIVIL,ZESTRIL) 10 MG tablet Take 1 tablet (10 mg total) by mouth daily. PATIENT NEEDS OFFICE VISIT FOR ADDITIONAL REFILLS - 2nd NOTICE 12/28/13  Yes Orma Flaming, MD  Vitamin D, Ergocalciferol, (DRISDOL) 50000 UNITS CAPS capsule Take 50,000 Units by mouth every 7 (seven) days. On Wednesday.   Yes Historical Provider, MD   Review of Systems  Psychiatric/Behavioral: Positive for dysphoric mood. The patient is nervous/anxious.       Objective:   Physical  Exam  Nursing note and vitals reviewed. Constitutional: She is oriented to person, place, and time. She appears well-developed and well-nourished. No distress.  HENT:  Head: Normocephalic and atraumatic.  Eyes: EOM are normal.  Neck: Neck supple. No tracheal deviation present.  Cardiovascular: Normal rate.   Pulmonary/Chest: Effort normal. No respiratory distress.  Musculoskeletal: Normal range of motion.  Neurological: She is alert and oriented to person, place, and time.  Skin: Skin is warm and dry.  Psychiatric: She has a normal mood and affect. Her behavior is normal.   patient is tremulous and tearful when describing the death of her husband 8 years ago. [place]       Assessment & Plan:    1. PTSD (post-traumatic stress disorder)   2. GAD (generalized anxiety disorder)   3. B12 deficiency    PTSD (post-traumatic stress disorder) - Plan: buPROPion (WELLBUTRIN SR) 150 MG 12 hr tablet, ALPRAZolam (XANAX) 0.25 MG tablet, DISCONTINUED: ALPRAZolam (XANAX) 0.25 MG tablet  GAD (generalized anxiety disorder) - Plan: ALPRAZolam (XANAX) 0.25 MG tablet, DISCONTINUED: ALPRAZolam (XANAX) 0.25 MG tablet  B12 deficiency - Plan: cyanocobalamin ((VITAMIN B-12)) injection 1,000 mcg  Signed, Robyn Haber, MD

## 2014-03-10 ENCOUNTER — Other Ambulatory Visit: Payer: Self-pay | Admitting: Internal Medicine

## 2014-04-05 ENCOUNTER — Other Ambulatory Visit: Payer: Self-pay | Admitting: Family Medicine

## 2014-04-15 ENCOUNTER — Telehealth: Payer: Self-pay

## 2014-04-15 ENCOUNTER — Telehealth: Payer: Self-pay | Admitting: *Deleted

## 2014-04-15 NOTE — Telephone Encounter (Signed)
Kathryn Thomas called- pt has had a HD placard for some time due to difficulty walking 200 yards without stopping to rest.  Completed form for her today

## 2014-04-15 NOTE — Telephone Encounter (Signed)
COPELAND - Pt left a handicap placard form for you to fill out.  Please call her at [phone] when it is ready to pick up.  I have left the form at the nurses desk.

## 2014-04-15 NOTE — Telephone Encounter (Signed)
Paperwork has been put in Dr. Lillie Fragmin box.

## 2014-04-15 NOTE — Telephone Encounter (Signed)
Patient left a form for a disability parking placard for Dr. Lorelei Pont to fill out. I called patient to find out why she needed it. Patient states it for a renewal and she cannot walk too far without having to rest. Her former physician used to renew it but hasn't had to be renewed in the past 4 years.Dr. Lorelei Pont has signed it and patient was notified that it is ready to be picked up.

## 2014-07-06 ENCOUNTER — Other Ambulatory Visit: Payer: Self-pay | Admitting: Internal Medicine

## 2014-07-07 ENCOUNTER — Other Ambulatory Visit: Payer: Self-pay | Admitting: Family Medicine

## 2014-07-07 DIAGNOSIS — R609 Edema, unspecified: Secondary | ICD-10-CM

## 2014-07-07 MED ORDER — HYDROCHLOROTHIAZIDE 12.5 MG PO CAPS: 12.5000 mg | ORAL_CAPSULE | Freq: Every day | ORAL | Status: DC

## 2014-07-12 ENCOUNTER — Other Ambulatory Visit: Payer: Self-pay | Admitting: Internal Medicine

## 2014-08-12 ENCOUNTER — Other Ambulatory Visit: Payer: Self-pay | Admitting: Internal Medicine

## 2014-09-12 ENCOUNTER — Other Ambulatory Visit: Payer: Self-pay | Admitting: Family Medicine

## 2014-09-13 NOTE — Telephone Encounter (Signed)
Faxed

## 2014-10-26 ENCOUNTER — Other Ambulatory Visit: Payer: Self-pay | Admitting: Family Medicine

## 2014-10-28 ENCOUNTER — Other Ambulatory Visit: Payer: Self-pay | Admitting: Family Medicine

## 2014-10-28 NOTE — Telephone Encounter (Signed)
Called in.

## 2014-10-28 NOTE — Telephone Encounter (Signed)
Faxed

## 2015-03-06 ENCOUNTER — Encounter: Payer: Self-pay | Admitting: *Deleted

## 2015-08-21 ENCOUNTER — Encounter: Payer: Self-pay | Admitting: Internal Medicine

## 2016-10-07 DIAGNOSIS — E559 Vitamin D deficiency, unspecified: Secondary | ICD-10-CM | POA: Diagnosis not present

## 2016-10-07 DIAGNOSIS — R319 Hematuria, unspecified: Secondary | ICD-10-CM | POA: Diagnosis not present

## 2016-10-07 DIAGNOSIS — F419 Anxiety disorder, unspecified: Secondary | ICD-10-CM | POA: Diagnosis not present

## 2016-10-07 DIAGNOSIS — I10 Essential (primary) hypertension: Secondary | ICD-10-CM | POA: Diagnosis not present

## 2016-10-07 DIAGNOSIS — E785 Hyperlipidemia, unspecified: Secondary | ICD-10-CM | POA: Diagnosis not present

## 2016-10-07 DIAGNOSIS — E119 Type 2 diabetes mellitus without complications: Secondary | ICD-10-CM | POA: Diagnosis not present

## 2016-10-07 DIAGNOSIS — J309 Allergic rhinitis, unspecified: Secondary | ICD-10-CM | POA: Diagnosis not present

## 2016-11-28 ENCOUNTER — Encounter (HOSPITAL_BASED_OUTPATIENT_CLINIC_OR_DEPARTMENT_OTHER): Payer: Self-pay | Admitting: *Deleted

## 2016-11-28 ENCOUNTER — Emergency Department (HOSPITAL_BASED_OUTPATIENT_CLINIC_OR_DEPARTMENT_OTHER)
Admission: EM | Admit: 2016-11-28 | Discharge: 2016-11-28 | Disposition: A | Discharge status: Home / Self Care | Payer: Medicare Other | Attending: Emergency Medicine | Admitting: Emergency Medicine

## 2016-11-28 ENCOUNTER — Emergency Department (HOSPITAL_BASED_OUTPATIENT_CLINIC_OR_DEPARTMENT_OTHER): Payer: Medicare Other

## 2016-11-28 DIAGNOSIS — R531 Weakness: Secondary | ICD-10-CM

## 2016-11-28 DIAGNOSIS — I10 Essential (primary) hypertension: Secondary | ICD-10-CM | POA: Insufficient documentation

## 2016-11-28 DIAGNOSIS — N390 Urinary tract infection, site not specified: Secondary | ICD-10-CM | POA: Diagnosis not present

## 2016-11-28 DIAGNOSIS — Z79899 Other long term (current) drug therapy: Secondary | ICD-10-CM | POA: Diagnosis not present

## 2016-11-28 DIAGNOSIS — E871 Hypo-osmolality and hyponatremia: Secondary | ICD-10-CM

## 2016-11-28 DIAGNOSIS — R0609 Other forms of dyspnea: Secondary | ICD-10-CM | POA: Diagnosis not present

## 2016-11-28 LAB — URINALYSIS, ROUTINE W REFLEX MICROSCOPIC
Glucose, UA: NEGATIVE mg/dL
Ketones, ur: NEGATIVE mg/dL
Nitrite: POSITIVE — AB
Protein, ur: 100 mg/dL — AB
Specific Gravity, Urine: 1.022 (ref 1.005–1.030)
pH: 5.5 (ref 5.0–8.0)

## 2016-11-28 LAB — CBC WITH DIFFERENTIAL/PLATELET
Basophils Absolute: 0 10*3/uL (ref 0.0–0.1)
Basophils Relative: 0 %
Eosinophils Absolute: 0 10*3/uL (ref 0.0–0.7)
Eosinophils Relative: 0 %
HCT: 47 % — ABNORMAL HIGH (ref 36.0–46.0)
Hemoglobin: 15.6 g/dL — ABNORMAL HIGH (ref 12.0–15.0)
Lymphocytes Relative: 19 %
Lymphs Abs: 1.4 10*3/uL (ref 0.7–4.0)
MCH: 28.8 pg (ref 26.0–34.0)
MCHC: 33.2 g/dL (ref 30.0–36.0)
MCV: 86.7 fL (ref 78.0–100.0)
Monocytes Absolute: 0.7 10*3/uL (ref 0.1–1.0)
Monocytes Relative: 10 %
Neutro Abs: 5.2 10*3/uL (ref 1.7–7.7)
Neutrophils Relative %: 71 %
Platelets: 222 10*3/uL (ref 150–400)
RBC: 5.42 MIL/uL — ABNORMAL HIGH (ref 3.87–5.11)
RDW: 13.3 % (ref 11.5–15.5)
WBC: 7.3 10*3/uL (ref 4.0–10.5)

## 2016-11-28 LAB — COMPREHENSIVE METABOLIC PANEL
ALT: 21 U/L (ref 14–54)
AST: 26 U/L (ref 15–41)
Albumin: 3.7 g/dL (ref 3.5–5.0)
Alkaline Phosphatase: 94 U/L (ref 38–126)
Anion gap: 9 (ref 5–15)
BUN: 25 mg/dL — ABNORMAL HIGH (ref 6–20)
CO2: 24 mmol/L (ref 22–32)
Calcium: 8.7 mg/dL — ABNORMAL LOW (ref 8.9–10.3)
Chloride: 96 mmol/L — ABNORMAL LOW (ref 101–111)
Creatinine, Ser: 1.41 mg/dL — ABNORMAL HIGH (ref 0.44–1.00)
GFR calc Af Amer: 41 mL/min — ABNORMAL LOW (ref >60)
GFR calc non Af Amer: 35 mL/min — ABNORMAL LOW (ref >60)
Glucose, Bld: 146 mg/dL — ABNORMAL HIGH (ref 65–99)
Potassium: 3.4 mmol/L — ABNORMAL LOW (ref 3.5–5.1)
Sodium: 129 mmol/L — ABNORMAL LOW (ref 135–145)
Total Bilirubin: 0.8 mg/dL (ref 0.3–1.2)
Total Protein: 8.2 g/dL — ABNORMAL HIGH (ref 6.5–8.1)

## 2016-11-28 LAB — URINALYSIS, MICROSCOPIC (REFLEX)

## 2016-11-28 LAB — TROPONIN I: Troponin I: <0.03 ng/mL (ref <0.03)

## 2016-11-28 MED ORDER — SODIUM CHLORIDE 0.9 % IV BOLUS (SEPSIS)
1000.0000 mL | Freq: Once | INTRAVENOUS | Status: AC
  Administered 2016-11-28: 1000 mL via INTRAVENOUS

## 2016-11-28 MED ORDER — DEXTROSE 5 % IV SOLN
1.0000 g | Freq: Once | INTRAVENOUS | Status: AC
  Administered 2016-11-28: 1 g via INTRAVENOUS
  Filled 2016-11-28: qty 10

## 2016-11-28 MED ORDER — CEPHALEXIN 500 MG PO CAPS: 500.0000 mg | ORAL_CAPSULE | Freq: Two times a day (BID) | ORAL | 0 refills | Status: DC

## 2016-11-28 MED FILL — CEPHALEXIN 500 MG CAPSULE: 500 | 7 days supply | Qty: 14 | Fill #0

## 2016-11-28 NOTE — ED Notes (Signed)
Pt reports she has been in bed for 4 days, weak, decreased appetite. Dizzy with movement. Denies chest pain and SOB, denies urinary symptoms but states her urine is dark. Denies N/V/D

## 2016-11-28 NOTE — ED Notes (Signed)
ED Provider at bedside. 

## 2016-11-28 NOTE — Discharge Instructions (Signed)
You have a UTI and you are mildly dehydrated. Your sodium is a little on the lower side, 129. Please drink plenty of fluids and keep well nourished. Please see your PCP on Monday for recheck on sodium and make sure your symptoms are improving.   Take antibiotics as prescribed. Return without fail for worsening symptoms, including fever, confusion, intractable vomiting, inability to walk, passing out, or any other symptoms concerning to you.

## 2016-11-28 NOTE — ED Triage Notes (Signed)
3 days of weakness and dizziness. No pain. No appetite.

## 2016-11-28 NOTE — ED Provider Notes (Signed)
[facility] DEPT [facility] Provider Note   CSN: [id number] Arrival date & time: 11/28/16  1301     History   Chief Complaint Chief Complaint  Patient presents with  . Dizziness  . Weakness    HPI Kathryn Thomas is a 76 y.o. female.  HPI  76 year old female who presents with generalized weakness and fatigue. She has a history of pernicious anemia, hypertension. States that she has had progressive generalized weakness over the past 3-4 days. Has been unable to get out of bed. States that if she were to get up and walk to the bathroom she would feel very winded and short of breath. Occasionally feels lightheaded especially when she is lying back. No chest pain, lower extremity edema, orthopnea or PND. No fevers, cough, vomiting, diarrhea, dysuria, or urinary frequency, or abdominal pain. Has been drinking water but states that her urine is still very dark. Had any appetite to eat. No syncope near syncope. No melena or hematochezia. Has not taken any medications or treatments.  Past Medical History:  Diagnosis Date  . Acute renal failure ([place])   . Anal stenosis 2010  . Anxiety   . Blood transfusion    " no reaction to transfusion "  . Depression   . External hemorrhoids   . Gastric polyp   . Gastritis   . Hypertension   . Intestinal metaplasia of gastric mucosa 09/2011   high grade dysplasia  . Pernicious anemia   . Shortness of breath   . Vitamin B12 deficiency     Patient Active Problem List   Diagnosis Date Noted  . Dyspnea on exertion 06/10/2013  . Microscopic hematuria 06/10/2013  . Precordial pain 05/13/2013  . Anal stenosis   . Pernicious anemia 10/01/2011  . Multiple gastric polyps 10/01/2011  . B12 deficiency anemia 10/01/2011  . Anemia 09/28/2011  . Weight loss 09/28/2011  . Positional lightheadedness 09/28/2011  . UTI (lower urinary tract infection) 09/28/2011    Past Surgical History:  Procedure Laterality Date  . ABDOMINAL HYSTERECTOMY    . BACK  SURGERY  1980   three lower spine surgeries.  . ESOPHAGOGASTRODUODENOSCOPY  10/01/2011   Procedure: ESOPHAGOGASTRODUODENOSCOPY (EGD);  Surgeon: Lafayette Dragon, MD;  Location: [facility] ENDOSCOPY;  Service: Endoscopy;  Laterality: N/A;    OB History    No data available       Home Medications    Prior to Admission medications   Medication Sig Start Date End Date Taking? Authorizing Provider  ALPRAZolam (XANAX) 0.25 MG tablet TAKE ONE TABLET BY MOUTH TWICE DAILY AS NEEDED FOR ANXIETY 10/28/14  Yes Robyn Haber, MD  buPROPion [facility] SR) 150 MG 12 hr tablet Take 1 tablet (150 mg total) by mouth 2 (two) times daily. 02/20/14  Yes Robyn Haber, MD  Cyanocobalamin (VITAMIN B-12 IJ) Inject 1 mL as directed every 30 (thirty) days.   Yes Historical Provider, MD  fluticasone (FLONASE) 50 MCG/ACT nasal spray Place 2 sprays into both nostrils daily. 12/28/13  Yes Orma Flaming, MD  hydrochlorothiazide (MICROZIDE) 12.5 MG capsule Take 1 capsule (12.5 mg total) by mouth daily. 07/07/14  Yes Chelle Jeffery, PA-C  ibuprofen (ADVIL,MOTRIN) 600 MG tablet Take 1 tablet (600 mg total) by mouth every 8 (eight) hours as needed. NO MORE REFILLS WITHOUT OFFICE VISIT - 2ND NOTICE 08/12/14  Yes Posey Boyer, MD  lisinopril (PRINIVIL,ZESTRIL) 10 MG tablet TAKE ONE TABLET BY MOUTH ONCE DAILY 07/06/14  Yes Robyn Haber, MD  Vitamin D, Ergocalciferol, (DRISDOL) 50000  UNITS CAPS capsule Take 50,000 Units by mouth every 7 (seven) days. On Wednesday.   Yes Historical Provider, MD  hydrochlorothiazide (HYDRODIURIL) 12.5 MG tablet 12.5 mg daily. Take daily for swelling 05/05/13   Darreld Mclean, MD    Family History Family History  Problem Relation Age of Onset  . Cervical cancer Mother   . Heart disease Mother     died of CHF  . Heart disease Father     died at 58 of heart attack  . Multiple myeloma Sister     spleen involvement  . Cancer Sister     bone marrow  . Stroke Sister   . Colon cancer Neg Hx      Social History Social History  Substance Use Topics  . Smoking status: Never Smoker  . Smokeless tobacco: Never Used  . Alcohol use No     Allergies   Patient has no known allergies.   Review of Systems Review of Systems 10/14 systems reviewed and are negative other than those stated in the HPI   Physical Exam Updated Vital Signs BP 113/64   Pulse 69   Temp 99.1 F (37.3 C) (Oral)   Resp 19   Ht '5\' 7"'  (1.702 m)   Wt 246 lb (111.6 kg)   SpO2 93%   BMI 38.53 kg/m   Physical Exam Physical Exam  Nursing note and vitals reviewed. Constitutional: non-toxic, and in no acute distress Head: Normocephalic and atraumatic.  Mouth/Throat: Oropharynx is clear and mucous membranes dry.  Neck: Normal range of motion. Neck supple.  Cardiovascular: Normal rate and regular rhythm.   Pulmonary/Chest: Effort normal and breath sounds normal.  Abdominal: Soft. There is no tenderness. There is no rebound and no guarding.  Musculoskeletal: Normal range of motion.  Neurological: Alert, no facial droop, fluent speech, moves all extremities symmetrically Skin: Skin is warm and dry.  Psychiatric: Cooperative   ED Treatments / Results  Labs (all labs ordered are listed, but only abnormal results are displayed) Labs Reviewed  URINALYSIS, ROUTINE W REFLEX MICROSCOPIC - Abnormal; Notable for the following:       Result Value   Color, Urine AMBER (*)    APPearance CLOUDY (*)    Hgb urine dipstick LARGE (*)    Bilirubin Urine SMALL (*)    Protein, ur 100 (*)    Nitrite POSITIVE (*)    Leukocytes, UA SMALL (*)    All other components within normal limits  CBC WITH DIFFERENTIAL/PLATELET - Abnormal; Notable for the following:    RBC 5.42 (*)    Hemoglobin 15.6 (*)    HCT 47.0 (*)    All other components within normal limits  COMPREHENSIVE METABOLIC PANEL - Abnormal; Notable for the following:    Sodium 129 (*)    Potassium 3.4 (*)    Chloride 96 (*)    Glucose, Bld 146 (*)     BUN 25 (*)    Creatinine, Ser 1.41 (*)    Calcium 8.7 (*)    Total Protein 8.2 (*)    GFR calc non Af Amer 35 (*)    GFR calc Af Amer 41 (*)    All other components within normal limits  URINALYSIS, MICROSCOPIC (REFLEX) - Abnormal; Notable for the following:    Bacteria, UA MANY (*)    Squamous Epithelial / LPF 0-5 (*)    All other components within normal limits  URINE CULTURE  TROPONIN I    EKG  EKG Interpretation  Date/Time:  Thursday November 28 2016 13:28:30 EST Ventricular Rate:  83 PR Interval:    QRS Duration: 107 QT Interval:  362 QTC Calculation: 426 R Axis:   5 Text Interpretation:  Sinus rhythm Multiple premature complexes, vent & supraven Incomplete left bundle branch block Inferior infarct, acute (RCA) Probable RV involvement, suggest recording right precordial leads Baseline wander in lead(s) I II III aVR aVL aVF V1 V2 V3 V5 V6 Discard EKG wandering baseline, unable to read  Confirmed by Asaiah Hunnicutt MD, Ronin Rehfeldt [phone]) on 11/28/2016 2:27:41 PM       Radiology Dg Chest 2 View  Result Date: 11/28/2016 CLINICAL DATA:  Dyspnea on exertion. EXAM: CHEST  2 VIEW COMPARISON:  Radiographs of May 13, 2013. FINDINGS: The heart size and mediastinal contours are within normal limits. Both lungs are clear. No pneumothorax or pleural effusion is noted. The visualized skeletal structures are unremarkable. IMPRESSION: No active cardiopulmonary disease. Electronically Signed   By: Marijo Conception, M.D.   On: 11/28/2016 13:55    Procedures Procedures (including critical care time)  Medications Ordered in ED Medications  cefTRIAXone (ROCEPHIN) 1 g in dextrose 5 % 50 mL IVPB (not administered)  sodium chloride 0.9 % bolus 1,000 mL (0 mLs Intravenous Stopped 11/28/16 1520)     Initial Impression / Assessment and Plan / ED Course  I have reviewed the triage vital signs and the nursing notes.  Pertinent labs & imaging results that were available during my care of the patient were reviewed by  me and considered in my medical decision making (see chart for details).     76 year old female who presents with several days of generalized weakness and fatigue. He is nontoxic in no acute distress with normal vital signs. Has a nonfocal exam. Does look dry and mildly dehydrated.  Blood work overall reassuring. No evidence of severe anemia. Mild hyponatremia, likely related to dehydration, and mild acute kidney injury with a creatinine of 1.4. Receive 1 L of IV fluids and feels significantly improved. She is able to ambulate to the bathroom without any difficulty.  Workup also suggestive of UTI, and this is likely contributing to her symptoms. Given gram of ceftriaxone in the ED. Chest x-ray visualized and without pneumonia, edema or other acute cardiopulmonary processes.  She expresses preferences to be managed from home. Given that she feels significant improvement and without major signs of systemic illness, I do feel that she is appropriate for outpatient treatment of her UTI. Encouraged increased oral hydration and PO intake. Did discuss close PCP follow-up and strict return instructions.    Final Clinical Impressions(s) / ED Diagnoses   Final diagnoses:  Lower urinary tract infectious disease  Generalized weakness    New Prescriptions New Prescriptions   No medications on file     Forde Dandy, MD 11/28/16 1556

## 2016-11-30 LAB — URINE CULTURE: Culture: >=100000 — AB

## 2016-12-01 ENCOUNTER — Telehealth: Payer: Self-pay

## 2016-12-01 NOTE — Telephone Encounter (Signed)
Post ED Visit - Positive Culture Follow-up  Culture report reviewed by antimicrobial stewardship pharmacist:  []  Elenor Quinones, Pharm.D. []  Heide Guile, Pharm.D., BCPS []  Parks Neptune, Pharm.D. []  Alycia Rossetti, Pharm.D., BCPS []  [place], [place].D., BCPS, AAHIVP []  Legrand Como, Pharm.D., BCPS, AAHIVP []  Milus Glazier, Pharm.D. []  Stephens November, [place].D. Rosezella Rumpf Pharm D Positive urine culture Treated with Cephalexin, organism sensitive to the same and no further patient follow-up is required at this time.  Genia Del 12/01/2016, 9:20 AM

## 2017-01-18 ENCOUNTER — Ambulatory Visit: Payer: Medicare Other | Admitting: Family Medicine

## 2017-04-04 DIAGNOSIS — Z0000 Encounter for general adult medical examination without abnormal findings: Secondary | ICD-10-CM | POA: Diagnosis not present

## 2017-04-04 DIAGNOSIS — I10 Essential (primary) hypertension: Secondary | ICD-10-CM | POA: Diagnosis not present

## 2017-04-04 DIAGNOSIS — J309 Allergic rhinitis, unspecified: Secondary | ICD-10-CM | POA: Diagnosis not present

## 2017-04-04 DIAGNOSIS — E119 Type 2 diabetes mellitus without complications: Secondary | ICD-10-CM | POA: Diagnosis not present

## 2017-04-04 DIAGNOSIS — E559 Vitamin D deficiency, unspecified: Secondary | ICD-10-CM | POA: Diagnosis not present

## 2017-04-04 DIAGNOSIS — F419 Anxiety disorder, unspecified: Secondary | ICD-10-CM | POA: Diagnosis not present

## 2017-04-04 DIAGNOSIS — E785 Hyperlipidemia, unspecified: Secondary | ICD-10-CM | POA: Diagnosis not present

## 2017-06-23 DIAGNOSIS — Z23 Encounter for immunization: Secondary | ICD-10-CM | POA: Diagnosis not present

## 2017-07-07 DIAGNOSIS — Z2821 Immunization not carried out because of patient refusal: Secondary | ICD-10-CM | POA: Diagnosis not present

## 2017-07-07 DIAGNOSIS — E559 Vitamin D deficiency, unspecified: Secondary | ICD-10-CM | POA: Diagnosis not present

## 2017-07-07 DIAGNOSIS — F419 Anxiety disorder, unspecified: Secondary | ICD-10-CM | POA: Diagnosis not present

## 2017-07-07 DIAGNOSIS — I10 Essential (primary) hypertension: Secondary | ICD-10-CM | POA: Diagnosis not present

## 2017-07-07 DIAGNOSIS — E785 Hyperlipidemia, unspecified: Secondary | ICD-10-CM | POA: Diagnosis not present

## 2017-07-07 DIAGNOSIS — J309 Allergic rhinitis, unspecified: Secondary | ICD-10-CM | POA: Diagnosis not present

## 2017-07-07 DIAGNOSIS — E119 Type 2 diabetes mellitus without complications: Secondary | ICD-10-CM | POA: Diagnosis not present

## 2017-10-13 DIAGNOSIS — E119 Type 2 diabetes mellitus without complications: Secondary | ICD-10-CM | POA: Diagnosis not present

## 2017-10-13 DIAGNOSIS — E785 Hyperlipidemia, unspecified: Secondary | ICD-10-CM | POA: Diagnosis not present

## 2017-10-13 DIAGNOSIS — E559 Vitamin D deficiency, unspecified: Secondary | ICD-10-CM | POA: Diagnosis not present

## 2017-10-13 DIAGNOSIS — F419 Anxiety disorder, unspecified: Secondary | ICD-10-CM | POA: Diagnosis not present

## 2017-10-13 DIAGNOSIS — I10 Essential (primary) hypertension: Secondary | ICD-10-CM | POA: Diagnosis not present

## 2017-10-13 DIAGNOSIS — J309 Allergic rhinitis, unspecified: Secondary | ICD-10-CM | POA: Diagnosis not present

## 2017-12-30 ENCOUNTER — Encounter: Payer: Self-pay | Admitting: Gastroenterology

## 2018-01-12 DIAGNOSIS — E559 Vitamin D deficiency, unspecified: Secondary | ICD-10-CM | POA: Diagnosis not present

## 2018-01-12 DIAGNOSIS — Z5181 Encounter for therapeutic drug level monitoring: Secondary | ICD-10-CM | POA: Diagnosis not present

## 2018-01-12 DIAGNOSIS — J309 Allergic rhinitis, unspecified: Secondary | ICD-10-CM | POA: Diagnosis not present

## 2018-01-12 DIAGNOSIS — E119 Type 2 diabetes mellitus without complications: Secondary | ICD-10-CM | POA: Diagnosis not present

## 2018-01-12 DIAGNOSIS — I10 Essential (primary) hypertension: Secondary | ICD-10-CM | POA: Diagnosis not present

## 2018-01-12 DIAGNOSIS — F419 Anxiety disorder, unspecified: Secondary | ICD-10-CM | POA: Diagnosis not present

## 2018-01-12 DIAGNOSIS — E538 Deficiency of other specified B group vitamins: Secondary | ICD-10-CM | POA: Diagnosis not present

## 2018-01-12 DIAGNOSIS — M545 Low back pain: Secondary | ICD-10-CM | POA: Diagnosis not present

## 2018-01-12 DIAGNOSIS — Z01118 Encounter for examination of ears and hearing with other abnormal findings: Secondary | ICD-10-CM | POA: Diagnosis not present

## 2018-01-12 DIAGNOSIS — F329 Major depressive disorder, single episode, unspecified: Secondary | ICD-10-CM | POA: Diagnosis not present

## 2018-01-12 DIAGNOSIS — E785 Hyperlipidemia, unspecified: Secondary | ICD-10-CM | POA: Diagnosis not present

## 2018-02-12 ENCOUNTER — Emergency Department (HOSPITAL_COMMUNITY): Payer: Medicare Other

## 2018-02-12 ENCOUNTER — Encounter (HOSPITAL_COMMUNITY): Payer: Self-pay | Admitting: Emergency Medicine

## 2018-02-12 ENCOUNTER — Emergency Department (HOSPITAL_COMMUNITY)
Admission: EM | Admit: 2018-02-12 | Discharge: 2018-02-12 | Disposition: A | Discharge status: Home / Self Care | Payer: Medicare Other | Attending: Emergency Medicine | Admitting: Emergency Medicine

## 2018-02-12 ENCOUNTER — Other Ambulatory Visit: Payer: Self-pay

## 2018-02-12 DIAGNOSIS — I10 Essential (primary) hypertension: Secondary | ICD-10-CM | POA: Diagnosis not present

## 2018-02-12 DIAGNOSIS — Y999 Unspecified external cause status: Secondary | ICD-10-CM | POA: Diagnosis not present

## 2018-02-12 DIAGNOSIS — S8392XA Sprain of unspecified site of left knee, initial encounter: Secondary | ICD-10-CM | POA: Diagnosis not present

## 2018-02-12 DIAGNOSIS — X58XXXA Exposure to other specified factors, initial encounter: Secondary | ICD-10-CM | POA: Diagnosis not present

## 2018-02-12 DIAGNOSIS — Y939 Activity, unspecified: Secondary | ICD-10-CM | POA: Insufficient documentation

## 2018-02-12 DIAGNOSIS — M25562 Pain in left knee: Secondary | ICD-10-CM | POA: Diagnosis not present

## 2018-02-12 DIAGNOSIS — Z79899 Other long term (current) drug therapy: Secondary | ICD-10-CM | POA: Insufficient documentation

## 2018-02-12 DIAGNOSIS — S8992XA Unspecified injury of left lower leg, initial encounter: Secondary | ICD-10-CM | POA: Diagnosis present

## 2018-02-12 DIAGNOSIS — Y929 Unspecified place or not applicable: Secondary | ICD-10-CM | POA: Diagnosis not present

## 2018-02-12 MED ORDER — TRAMADOL HCL 50 MG PO TABS: 50.0000 mg | ORAL_TABLET | Freq: Once | ORAL | Status: DC

## 2018-02-12 MED ORDER — TRAMADOL HCL 50 MG PO TABS: 50.0000 mg | ORAL_TABLET | Freq: Two times a day (BID) | ORAL | 0 refills | Status: DC | PRN

## 2018-02-12 NOTE — ED Triage Notes (Signed)
Patient to ED c/o L knee pain x 3 weeks, states it started in her kneecap and is now on the medial side of her knee, worse when trying to straighten her leg out and ambulating is more difficult d/t pain. Patient denies known injury or trauma. Skin temperature and color equal bilaterally, no calf tenderness or obvious unequal swelling.

## 2018-02-12 NOTE — Discharge Instructions (Addendum)
Please read attached information. If you experience any new or worsening signs or symptoms please return to the emergency room for evaluation. Please follow-up with your primary care provider or specialist as discussed.  °

## 2018-02-12 NOTE — ED Provider Notes (Addendum)
[facility] EMERGENCY DEPARTMENT Provider Note   CSN: [id number] Arrival date & time: 02/12/18  1062     History   Chief Complaint Chief Complaint  Patient presents with  . Knee Pain    HPI Kathryn Thomas is a 77 y.o. female.  HPI   77 year old female presents today with complaints of left knee pain.  Patient reports a 2-week history of knee pain.  She notes this started as an anterior based pain with no swelling or edema, no trauma.  She notes this is radiated around to the posterior aspect of her knee.  She notes pain with range of motion but has full active range of motion, she denies any redness warmth to touch or swelling.  No history of the same.  Patient notes taking ibuprofen at home without symptomatic improvement.  Past Medical History:  Diagnosis Date  . Acute renal failure ([place])   . Anal stenosis 2010  . Anxiety   . Blood transfusion    " no reaction to transfusion "  . Depression   . External hemorrhoids   . Gastric polyp   . Gastritis   . Hypertension   . Intestinal metaplasia of gastric mucosa 09/2011   high grade dysplasia  . Pernicious anemia   . Shortness of breath   . Vitamin B12 deficiency     Patient Active Problem List   Diagnosis Date Noted  . Dyspnea on exertion 06/10/2013  . Microscopic hematuria 06/10/2013  . Precordial pain 05/13/2013  . Anal stenosis   . Pernicious anemia 10/01/2011  . Multiple gastric polyps 10/01/2011  . B12 deficiency anemia 10/01/2011  . Anemia 09/28/2011  . Weight loss 09/28/2011  . Positional lightheadedness 09/28/2011  . UTI (lower urinary tract infection) 09/28/2011    Past Surgical History:  Procedure Laterality Date  . ABDOMINAL HYSTERECTOMY    . BACK SURGERY  1980   three lower spine surgeries.  . ESOPHAGOGASTRODUODENOSCOPY  10/01/2011   Procedure: ESOPHAGOGASTRODUODENOSCOPY (EGD);  Surgeon: Lafayette Dragon, MD;  Location: [facility] ENDOSCOPY;  Service: Endoscopy;  Laterality: N/A;      OB History   None      Home Medications    Prior to Admission medications   Medication Sig Start Date End Date Taking? Authorizing Provider  ALPRAZolam (XANAX) 0.25 MG tablet TAKE ONE TABLET BY MOUTH TWICE DAILY AS NEEDED FOR ANXIETY 10/28/14   Robyn Haber, MD  buPROPion [facility] SR) 150 MG 12 hr tablet Take 1 tablet (150 mg total) by mouth 2 (two) times daily. 02/20/14   Robyn Haber, MD  cephALEXin (KEFLEX) 500 MG capsule Take 1 capsule (500 mg total) by mouth 2 (two) times daily. 11/28/16   Forde Dandy, MD  Cyanocobalamin (VITAMIN B-12 IJ) Inject 1 mL as directed every 30 (thirty) days.    [provider]  fluticasone (FLONASE) 50 MCG/ACT nasal spray Place 2 sprays into both nostrils daily. 12/28/13   Orma Flaming, MD  hydrochlorothiazide (HYDRODIURIL) 12.5 MG tablet 12.5 mg daily. Take daily for swelling 05/05/13   Copland, Gay Filler, MD  hydrochlorothiazide (MICROZIDE) 12.5 MG capsule Take 1 capsule (12.5 mg total) by mouth daily. 07/07/14   Harrison Mons, PA-C  ibuprofen (ADVIL,MOTRIN) 600 MG tablet Take 1 tablet (600 mg total) by mouth every 8 (eight) hours as needed. NO MORE REFILLS WITHOUT OFFICE VISIT - 2ND NOTICE 08/12/14   Posey Boyer, MD  lisinopril (PRINIVIL,ZESTRIL) 10 MG tablet TAKE ONE TABLET BY MOUTH ONCE DAILY 07/06/14  Robyn Haber, MD  Vitamin D, Ergocalciferol, (DRISDOL) 50000 UNITS CAPS capsule Take 50,000 Units by mouth every 7 (seven) days. On Wednesday.    [provider]    Family History Family History  Problem Relation Age of Onset  . Cervical cancer Mother   . Heart disease Mother        died of CHF  . Heart disease Father        died at 9 of heart attack  . Multiple myeloma Sister        spleen involvement  . Cancer Sister        bone marrow  . Stroke Sister   . Colon cancer Neg Hx     Social History Social History   Tobacco Use  . Smoking status: Never Smoker  . Smokeless tobacco: Never Used   Substance Use Topics  . Alcohol use: No  . Drug use: No     Allergies   Patient has no known allergies.   Review of Systems Review of Systems  All other systems reviewed and are negative.    Physical Exam Updated Vital Signs BP (!) 147/84 (BP Location: Right Arm)   Pulse 70   Temp 97.7 F (36.5 C) (Oral)   Resp 16   SpO2 95%   Physical Exam  Constitutional: She is oriented to person, place, and time. She appears well-developed and well-nourished.  HENT:  Head: Normocephalic and atraumatic.  Eyes: Pupils are equal, round, and reactive to light. Conjunctivae are normal. Right eye exhibits no discharge. Left eye exhibits no discharge. No scleral icterus.  Neck: Normal range of motion. No JVD present. No tracheal deviation present.  Pulmonary/Chest: Effort normal. No stridor.  Musculoskeletal:  Left knee atraumatic no swelling or edema, no warmth to touch, pain with range of motion, patient has full active range of motion of the knee-no significant laxity  Neurological: She is alert and oriented to person, place, and time. Coordination normal.  Psychiatric: She has a normal mood and affect. Her behavior is normal. Judgment and thought content normal.  Nursing note and vitals reviewed.    ED Treatments / Results  Labs (all labs ordered are listed, but only abnormal results are displayed) Labs Reviewed - No data to display  EKG None  Radiology Dg Knee Complete 4 Views Left  Result Date: 02/12/2018 CLINICAL DATA:  Left knee pain for 3 weeks.  No known injury. EXAM: LEFT KNEE - COMPLETE 4+ VIEW COMPARISON:  No recent prior. FINDINGS: No acute bony or joint abnormality identified. No evidence of fracture or dislocation. Mild tricompartment degenerative change. Chondrocalcinosis noted. This is most likely from degenerative disease. No prominent effusion. IMPRESSION: No acute bony or joint abnormality. Mild degenerative scratched it mild tricompartment degenerative change  with chondrocalcinosis. Electronically Signed   By: Marcello Moores  Register   On: 02/12/2018 11:01    Procedures Procedures (including critical care time)  Medications Ordered in ED Medications - No data to display   Initial Impression / Assessment and Plan / ED Course  I have reviewed the triage vital signs and the nursing notes.  Pertinent labs & imaging results that were available during my care of the patient were reviewed by me and considered in my medical decision making (see chart for details).     Labs:   Imaging: DG knee complete left  Consults:  Therapeutics:  Discharge Meds:   Assessment/Plan: 77 year old female presents today with knee pain.  Patient with no signs of trauma to her  knee, she has very minor arthritis within the knee.  I do recommend the patient follow-up as an outpatient with orthopedics, patient is adamant that she will not follow-up with orthopedics, she will follow-up with her primary care provider.  She has no signs of trauma no signs of infection, patient given strict return precautions. Pt is adamant that she needs something more for her pain.  I had a lengthy discussion of risks and benefits of pain management including falls, adverse reactions.  Patient understands the risks and is adamant on receiving medication.  She verbalized understanding and agreement to today's plan had no further questions concerns the time discharge.    Final Clinical Impressions(s) / ED Diagnoses   Final diagnoses:  Sprain of left knee, unspecified ligament, initial encounter    ED Discharge Orders    None         Francee Gentile 02/12/18 1401    Drenda Freeze, MD 02/12/18 2204

## 2018-03-16 DIAGNOSIS — E119 Type 2 diabetes mellitus without complications: Secondary | ICD-10-CM | POA: Diagnosis not present

## 2018-03-16 DIAGNOSIS — F419 Anxiety disorder, unspecified: Secondary | ICD-10-CM | POA: Diagnosis not present

## 2018-03-16 DIAGNOSIS — E559 Vitamin D deficiency, unspecified: Secondary | ICD-10-CM | POA: Diagnosis not present

## 2018-03-16 DIAGNOSIS — E785 Hyperlipidemia, unspecified: Secondary | ICD-10-CM | POA: Diagnosis not present

## 2018-03-16 DIAGNOSIS — J309 Allergic rhinitis, unspecified: Secondary | ICD-10-CM | POA: Diagnosis not present

## 2018-03-16 DIAGNOSIS — I10 Essential (primary) hypertension: Secondary | ICD-10-CM | POA: Diagnosis not present

## 2018-03-16 DIAGNOSIS — N183 Chronic kidney disease, stage 3 (moderate): Secondary | ICD-10-CM | POA: Diagnosis not present

## 2018-05-04 DIAGNOSIS — E785 Hyperlipidemia, unspecified: Secondary | ICD-10-CM | POA: Diagnosis not present

## 2018-05-04 DIAGNOSIS — E559 Vitamin D deficiency, unspecified: Secondary | ICD-10-CM | POA: Diagnosis not present

## 2018-05-04 DIAGNOSIS — E119 Type 2 diabetes mellitus without complications: Secondary | ICD-10-CM | POA: Diagnosis not present

## 2018-05-04 DIAGNOSIS — J309 Allergic rhinitis, unspecified: Secondary | ICD-10-CM | POA: Diagnosis not present

## 2018-05-04 DIAGNOSIS — Z0000 Encounter for general adult medical examination without abnormal findings: Secondary | ICD-10-CM | POA: Diagnosis not present

## 2018-05-04 DIAGNOSIS — I10 Essential (primary) hypertension: Secondary | ICD-10-CM | POA: Diagnosis not present

## 2018-05-04 DIAGNOSIS — F419 Anxiety disorder, unspecified: Secondary | ICD-10-CM | POA: Diagnosis not present

## 2018-05-04 DIAGNOSIS — N183 Chronic kidney disease, stage 3 (moderate): Secondary | ICD-10-CM | POA: Diagnosis not present

## 2018-08-03 DIAGNOSIS — I10 Essential (primary) hypertension: Secondary | ICD-10-CM | POA: Diagnosis not present

## 2018-08-03 DIAGNOSIS — E119 Type 2 diabetes mellitus without complications: Secondary | ICD-10-CM | POA: Diagnosis not present

## 2018-08-03 DIAGNOSIS — E559 Vitamin D deficiency, unspecified: Secondary | ICD-10-CM | POA: Diagnosis not present

## 2018-08-03 DIAGNOSIS — J309 Allergic rhinitis, unspecified: Secondary | ICD-10-CM | POA: Diagnosis not present

## 2018-08-03 DIAGNOSIS — N183 Chronic kidney disease, stage 3 (moderate): Secondary | ICD-10-CM | POA: Diagnosis not present

## 2018-08-03 DIAGNOSIS — E782 Mixed hyperlipidemia: Secondary | ICD-10-CM | POA: Diagnosis not present

## 2018-08-03 DIAGNOSIS — F419 Anxiety disorder, unspecified: Secondary | ICD-10-CM | POA: Diagnosis not present

## 2018-09-07 DIAGNOSIS — J309 Allergic rhinitis, unspecified: Secondary | ICD-10-CM | POA: Diagnosis not present

## 2018-09-07 DIAGNOSIS — E559 Vitamin D deficiency, unspecified: Secondary | ICD-10-CM | POA: Diagnosis not present

## 2018-09-07 DIAGNOSIS — E119 Type 2 diabetes mellitus without complications: Secondary | ICD-10-CM | POA: Diagnosis not present

## 2018-09-07 DIAGNOSIS — E782 Mixed hyperlipidemia: Secondary | ICD-10-CM | POA: Diagnosis not present

## 2018-09-07 DIAGNOSIS — N183 Chronic kidney disease, stage 3 (moderate): Secondary | ICD-10-CM | POA: Diagnosis not present

## 2018-09-07 DIAGNOSIS — F419 Anxiety disorder, unspecified: Secondary | ICD-10-CM | POA: Diagnosis not present

## 2018-09-07 DIAGNOSIS — I10 Essential (primary) hypertension: Secondary | ICD-10-CM | POA: Diagnosis not present

## 2018-12-31 DIAGNOSIS — J309 Allergic rhinitis, unspecified: Secondary | ICD-10-CM | POA: Diagnosis not present

## 2018-12-31 DIAGNOSIS — E559 Vitamin D deficiency, unspecified: Secondary | ICD-10-CM | POA: Diagnosis not present

## 2018-12-31 DIAGNOSIS — E782 Mixed hyperlipidemia: Secondary | ICD-10-CM | POA: Diagnosis not present

## 2018-12-31 DIAGNOSIS — I10 Essential (primary) hypertension: Secondary | ICD-10-CM | POA: Diagnosis not present

## 2018-12-31 DIAGNOSIS — F419 Anxiety disorder, unspecified: Secondary | ICD-10-CM | POA: Diagnosis not present

## 2018-12-31 DIAGNOSIS — E119 Type 2 diabetes mellitus without complications: Secondary | ICD-10-CM | POA: Diagnosis not present

## 2018-12-31 DIAGNOSIS — N183 Chronic kidney disease, stage 3 (moderate): Secondary | ICD-10-CM | POA: Diagnosis not present

## 2019-03-08 DIAGNOSIS — J309 Allergic rhinitis, unspecified: Secondary | ICD-10-CM | POA: Diagnosis not present

## 2019-03-08 DIAGNOSIS — N183 Chronic kidney disease, stage 3 (moderate): Secondary | ICD-10-CM | POA: Diagnosis not present

## 2019-03-08 DIAGNOSIS — E559 Vitamin D deficiency, unspecified: Secondary | ICD-10-CM | POA: Diagnosis not present

## 2019-03-08 DIAGNOSIS — F419 Anxiety disorder, unspecified: Secondary | ICD-10-CM | POA: Diagnosis not present

## 2019-03-08 DIAGNOSIS — E782 Mixed hyperlipidemia: Secondary | ICD-10-CM | POA: Diagnosis not present

## 2019-03-08 DIAGNOSIS — Z1329 Encounter for screening for other suspected endocrine disorder: Secondary | ICD-10-CM | POA: Diagnosis not present

## 2019-03-08 DIAGNOSIS — I10 Essential (primary) hypertension: Secondary | ICD-10-CM | POA: Diagnosis not present

## 2019-03-08 DIAGNOSIS — E1165 Type 2 diabetes mellitus with hyperglycemia: Secondary | ICD-10-CM | POA: Diagnosis not present

## 2019-04-09 ENCOUNTER — Emergency Department (HOSPITAL_BASED_OUTPATIENT_CLINIC_OR_DEPARTMENT_OTHER): Payer: Medicare Other

## 2019-04-09 ENCOUNTER — Encounter (HOSPITAL_BASED_OUTPATIENT_CLINIC_OR_DEPARTMENT_OTHER): Payer: Self-pay

## 2019-04-09 ENCOUNTER — Emergency Department (HOSPITAL_BASED_OUTPATIENT_CLINIC_OR_DEPARTMENT_OTHER)
Admission: EM | Admit: 2019-04-09 | Discharge: 2019-04-09 | Disposition: A | Discharge status: Home / Self Care | Payer: Medicare Other | Attending: Emergency Medicine | Admitting: Emergency Medicine

## 2019-04-09 DIAGNOSIS — I493 Ventricular premature depolarization: Secondary | ICD-10-CM | POA: Insufficient documentation

## 2019-04-09 DIAGNOSIS — G4709 Other insomnia: Secondary | ICD-10-CM | POA: Diagnosis not present

## 2019-04-09 DIAGNOSIS — R0602 Shortness of breath: Secondary | ICD-10-CM

## 2019-04-09 DIAGNOSIS — U071 COVID-19: Secondary | ICD-10-CM | POA: Diagnosis not present

## 2019-04-09 DIAGNOSIS — Z1329 Encounter for screening for other suspected endocrine disorder: Secondary | ICD-10-CM | POA: Diagnosis not present

## 2019-04-09 DIAGNOSIS — F419 Anxiety disorder, unspecified: Secondary | ICD-10-CM | POA: Diagnosis not present

## 2019-04-09 DIAGNOSIS — Z20828 Contact with and (suspected) exposure to other viral communicable diseases: Secondary | ICD-10-CM | POA: Diagnosis not present

## 2019-04-09 DIAGNOSIS — R42 Dizziness and giddiness: Secondary | ICD-10-CM | POA: Diagnosis not present

## 2019-04-09 DIAGNOSIS — I10 Essential (primary) hypertension: Secondary | ICD-10-CM | POA: Diagnosis not present

## 2019-04-09 DIAGNOSIS — F411 Generalized anxiety disorder: Secondary | ICD-10-CM | POA: Diagnosis not present

## 2019-04-09 LAB — CBC WITH DIFFERENTIAL/PLATELET
Abs Immature Granulocytes: 0.03 10*3/uL (ref 0.00–0.07)
Basophils Absolute: 0.1 10*3/uL (ref 0.0–0.1)
Basophils Relative: 1 %
Eosinophils Absolute: 0.1 10*3/uL (ref 0.0–0.5)
Eosinophils Relative: 1 %
HCT: 45 % (ref 36.0–46.0)
Hemoglobin: 14.5 g/dL (ref 12.0–15.0)
Immature Granulocytes: 0 %
Lymphocytes Relative: 31 %
Lymphs Abs: 2.9 10*3/uL (ref 0.7–4.0)
MCH: 29 pg (ref 26.0–34.0)
MCHC: 32.2 g/dL (ref 30.0–36.0)
MCV: 90 fL (ref 80.0–100.0)
Monocytes Absolute: 0.7 10*3/uL (ref 0.1–1.0)
Monocytes Relative: 7 %
Neutro Abs: 5.5 10*3/uL (ref 1.7–7.7)
Neutrophils Relative %: 60 %
Platelets: 274 10*3/uL (ref 150–400)
RBC: 5 MIL/uL (ref 3.87–5.11)
RDW: 13.5 % (ref 11.5–15.5)
WBC: 9.2 10*3/uL (ref 4.0–10.5)
nRBC: 0 % (ref 0.0–0.2)

## 2019-04-09 LAB — COMPREHENSIVE METABOLIC PANEL
ALT: 28 U/L (ref 0–44)
AST: 34 U/L (ref 15–41)
Albumin: 4.1 g/dL (ref 3.5–5.0)
Alkaline Phosphatase: 66 U/L (ref 38–126)
Anion gap: 12 (ref 5–15)
BUN: 21 mg/dL (ref 8–23)
CO2: 26 mmol/L (ref 22–32)
Calcium: 9.4 mg/dL (ref 8.9–10.3)
Chloride: 102 mmol/L (ref 98–111)
Creatinine, Ser: 0.98 mg/dL (ref 0.44–1.00)
GFR calc Af Amer: >60 mL/min (ref >60)
GFR calc non Af Amer: 55 mL/min — ABNORMAL LOW (ref >60)
Glucose, Bld: 152 mg/dL — ABNORMAL HIGH (ref 70–99)
Potassium: 3.5 mmol/L (ref 3.5–5.1)
Sodium: 140 mmol/L (ref 135–145)
Total Bilirubin: 1.1 mg/dL (ref 0.3–1.2)
Total Protein: 8 g/dL (ref 6.5–8.1)

## 2019-04-09 LAB — TROPONIN I (HIGH SENSITIVITY)
Troponin I (High Sensitivity): 5 ng/L (ref <18)
Troponin I (High Sensitivity): 6 ng/L (ref <18)

## 2019-04-09 LAB — MAGNESIUM: Magnesium: 2 mg/dL (ref 1.7–2.4)

## 2019-04-09 LAB — PHOSPHORUS: Phosphorus: 2.9 mg/dL (ref 2.5–4.6)

## 2019-04-09 MED ORDER — DIAZEPAM 2 MG PO TABS
2.0000 mg | ORAL_TABLET | Freq: Once | ORAL | Status: AC
  Administered 2019-04-09: 2 mg via ORAL
  Filled 2019-04-09: qty 1

## 2019-04-09 MED ORDER — IOHEXOL 350 MG/ML SOLN
100.0000 mL | Freq: Once | INTRAVENOUS | Status: AC | PRN
  Administered 2019-04-09: 77 mL via INTRAVENOUS

## 2019-04-09 MED ORDER — METOPROLOL TARTRATE 50 MG PO TABS
25.0000 mg | ORAL_TABLET | Freq: Once | ORAL | Status: AC
  Administered 2019-04-09: 25 mg via ORAL
  Filled 2019-04-09: qty 1

## 2019-04-09 NOTE — ED Notes (Signed)
Patient transported to X-ray 

## 2019-04-09 NOTE — ED Notes (Signed)
IV infiltrated- blood obtained and sent to lab

## 2019-04-09 NOTE — ED Notes (Signed)
Pt remains anxious.

## 2019-04-09 NOTE — ED Provider Notes (Signed)
[place] EMERGENCY DEPARTMENT Provider Note   CSN: [id number] Arrival date & time: 04/09/19  2036     History   Chief Complaint Chief Complaint  Patient presents with  . Shortness of Breath    HPI MIEKE BRINLEY is a 78 y.o. female.     HPI Patient is a 78 year old female with a history of pernicious anemia presents the emergency department with complaints of shortness of breath over the past 2 weeks and worse over the past 2 days.  She feels like the room in the world is closing in on her.  She states she was seen in urgent care earlier today for same symptoms and reports chest x-ray labs and EKG were performed and the patient was told that everything looked normal.  She was checked for coronavirus but the test result is still pending at this time.  No fevers or chills.  No unilateral leg swelling.  No history of DVT or pulmonary embolism.  She reports nothing new was changed in her home life.  No new medications.   Past Medical History:  Diagnosis Date  . Acute renal failure ([place])   . Anal stenosis 2010  . Anxiety   . Blood transfusion    " no reaction to transfusion "  . Depression   . External hemorrhoids   . Gastric polyp   . Gastritis   . Hypertension   . Intestinal metaplasia of gastric mucosa 09/2011   high grade dysplasia  . Pernicious anemia   . Shortness of breath   . Vitamin B12 deficiency     Patient Active Problem List   Diagnosis Date Noted  . Dyspnea on exertion 06/10/2013  . Microscopic hematuria 06/10/2013  . Precordial pain 05/13/2013  . Anal stenosis   . Pernicious anemia 10/01/2011  . Multiple gastric polyps 10/01/2011  . B12 deficiency anemia 10/01/2011  . Anemia 09/28/2011  . Weight loss 09/28/2011  . Positional lightheadedness 09/28/2011  . UTI (lower urinary tract infection) 09/28/2011    Past Surgical History:  Procedure Laterality Date  . ABDOMINAL HYSTERECTOMY    . BACK SURGERY  1980   three lower spine  surgeries.  . ESOPHAGOGASTRODUODENOSCOPY  10/01/2011   Procedure: ESOPHAGOGASTRODUODENOSCOPY (EGD);  Surgeon: Lafayette Dragon, MD;  Location: [facility] ENDOSCOPY;  Service: Endoscopy;  Laterality: N/A;     OB History   No obstetric history on file.      Home Medications    Prior to Admission medications   Medication Sig Start Date End Date Taking? Authorizing Provider  ALPRAZolam (XANAX) 0.25 MG tablet TAKE ONE TABLET BY MOUTH TWICE DAILY AS NEEDED FOR ANXIETY 10/28/14   Robyn Haber, MD  buPROPion [facility] SR) 150 MG 12 hr tablet Take 1 tablet (150 mg total) by mouth 2 (two) times daily. 02/20/14   Robyn Haber, MD  cephALEXin (KEFLEX) 500 MG capsule Take 1 capsule (500 mg total) by mouth 2 (two) times daily. 11/28/16   Forde Dandy, MD  Cyanocobalamin (VITAMIN B-12 IJ) Inject 1 mL as directed every 30 (thirty) days.    [provider]  fluticasone (FLONASE) 50 MCG/ACT nasal spray Place 2 sprays into both nostrils daily. 12/28/13   Orma Flaming, MD  hydrochlorothiazide (HYDRODIURIL) 12.5 MG tablet 12.5 mg daily. Take daily for swelling 05/05/13   Copland, Gay Filler, MD  hydrochlorothiazide (MICROZIDE) 12.5 MG capsule Take 1 capsule (12.5 mg total) by mouth daily. 07/07/14   Harrison Mons, PA  ibuprofen (ADVIL,MOTRIN) 600 MG tablet  Take 1 tablet (600 mg total) by mouth every 8 (eight) hours as needed. NO MORE REFILLS WITHOUT OFFICE VISIT - 2ND NOTICE 08/12/14   Posey Boyer, MD  lisinopril (PRINIVIL,ZESTRIL) 10 MG tablet TAKE ONE TABLET BY MOUTH ONCE DAILY 07/06/14   Robyn Haber, MD  traMADol (ULTRAM) 50 MG tablet Take 1 tablet (50 mg total) by mouth every 12 (twelve) hours as needed. 02/12/18   Hedges, Dellis Filbert, PA-C  Vitamin D, Ergocalciferol, (DRISDOL) 50000 UNITS CAPS capsule Take 50,000 Units by mouth every 7 (seven) days. On Wednesday.    [provider]    Family History Family History  Problem Relation Age of Onset  . Cervical cancer Mother   . Heart  disease Mother        died of CHF  . Heart disease Father        died at 68 of heart attack  . Multiple myeloma Sister        spleen involvement  . Cancer Sister        bone marrow  . Stroke Sister   . Colon cancer Neg Hx     Social History Social History   Tobacco Use  . Smoking status: Never Smoker  . Smokeless tobacco: Never Used  Substance Use Topics  . Alcohol use: No  . Drug use: No     Allergies   Patient has no known allergies.   Review of Systems Review of Systems  All other systems reviewed and are negative.    Physical Exam Updated Vital Signs BP (!) 143/69 (BP Location: Right Arm)   Pulse 82   Temp 98.3 F (36.8 C) (Oral)   Resp 20   Ht '5\' 9"'  (1.753 m)   Wt 111.1 kg   SpO2 97%   BMI 36.18 kg/m   Physical Exam Vitals signs and nursing note reviewed.  Constitutional:      General: She is not in acute distress.    Appearance: She is well-developed.  HENT:     Head: Normocephalic and atraumatic.  Neck:     Musculoskeletal: Normal range of motion.  Cardiovascular:     Rate and Rhythm: Normal rate and regular rhythm.     Heart sounds: Normal heart sounds.  Pulmonary:     Effort: Pulmonary effort is normal.     Breath sounds: Normal breath sounds.  Abdominal:     General: There is no distension.     Palpations: Abdomen is soft.     Tenderness: There is no abdominal tenderness.  Musculoskeletal: Normal range of motion.     Right lower leg: No edema.     Left lower leg: No edema.  Skin:    General: Skin is warm and dry.  Neurological:     Mental Status: She is alert and oriented to person, place, and time.  Psychiatric:        Judgment: Judgment normal.     Comments: Anxious appearing      ED Treatments / Results  Labs (all labs ordered are listed, but only abnormal results are displayed) Labs Reviewed  COMPREHENSIVE METABOLIC PANEL - Abnormal; Notable for the following components:      Result Value   Glucose, Bld 152 (*)     GFR calc non Af Amer 55 (*)    All other components within normal limits  CBC WITH DIFFERENTIAL/PLATELET  MAGNESIUM  PHOSPHORUS  TROPONIN I (HIGH SENSITIVITY)  TROPONIN I (HIGH SENSITIVITY)    EKG EKG Interpretation  Date/Time:  Friday April 09 2019 20:50:55 EDT Ventricular Rate:  73 PR Interval:    QRS Duration: 94 QT Interval:  389 QTC Calculation: 429 R Axis:   -8 Text Interpretation:  Sinus rhythm Ventricular trigeminy Low voltage, precordial leads Abnormal R-wave progression, early transition No significant change was found Confirmed by Jola Schmidt [phone]) on 04/09/2019 10:48:10 PM   Radiology Dg Chest Portable 1 View  Result Date: 04/09/2019 CLINICAL DATA:  Shortness of breath EXAM: PORTABLE CHEST 1 VIEW COMPARISON:  11/28/2016 FINDINGS: Heart and mediastinal contours are within normal limits. No focal opacities or effusions. No acute bony abnormality. IMPRESSION: No active disease. Electronically Signed   By: Rolm Baptise M.D.   On: 04/09/2019 21:28    Procedures Procedures (including critical care time)  Medications Ordered in ED Medications  diazepam (VALIUM) tablet 2 mg (2 mg Oral Given 04/09/19 2104)  iohexol (OMNIPAQUE) 350 MG/ML injection 100 mL (77 mLs Intravenous Contrast Given 04/09/19 2240)     Initial Impression / Assessment and Plan / ED Course  I have reviewed the triage vital signs and the nursing notes.  Pertinent labs & imaging results that were available during my care of the patient were reviewed by me and considered in my medical decision making (see chart for details).        Patient remains rather anxious.  Her work-up in the emergency department thus far is without significant abnormality.  She will move forward with CT angiogram of her chest to rule out PE or other occult pathology such as occult pneumonia and or pericardial effusion.  Initial troponin is negative.  Second troponin will be pending shortly.  She did have several episodes of  PVCs and trigeminy she could be symptomatic from these.  With time here in the emergency department this seems to be decreasing in frequency.  She was given 2 mg of Valium hopefully this is helping.  I suspect the patient can be discharged home.  I am not sure that we are the appropriate providers to be initiating benzodiazepines in a 78 year old.  I will allow her to further discuss this with her primary care physician as she may benefit from SSRI versus low-dose benzodiazepine.  Her heart rate is normal.  Her oxygen saturation is 96%.  Besides anxious she is otherwise well-appearing.  Final Clinical Impressions(s) / ED Diagnoses   Final diagnoses:  None    ED Discharge Orders    None       Jola Schmidt, MD 04/09/19 2256

## 2019-04-09 NOTE — ED Notes (Signed)
Spoke with pts daughter and informed of results and plan of care. No concerns or complaints at this time.

## 2019-04-09 NOTE — ED Triage Notes (Addendum)
Pt c/o SOB x 2 weeks-worse x 2 days-pt began crying and stated she "feels like the world is closing in on me"-pt refused w/c to tx area x 2-no resp distress noted-family x 2 with pt upon arrival-to car due to current visitor policy-pt states she was seen at [facility] today for same c/o

## 2019-04-09 NOTE — ED Notes (Signed)
Patient transported to CT 

## 2019-04-09 NOTE — ED Notes (Signed)
Pt given water 

## 2019-04-23 DIAGNOSIS — E559 Vitamin D deficiency, unspecified: Secondary | ICD-10-CM | POA: Diagnosis not present

## 2019-04-23 DIAGNOSIS — J309 Allergic rhinitis, unspecified: Secondary | ICD-10-CM | POA: Diagnosis not present

## 2019-04-23 DIAGNOSIS — N183 Chronic kidney disease, stage 3 (moderate): Secondary | ICD-10-CM | POA: Diagnosis not present

## 2019-04-23 DIAGNOSIS — R0602 Shortness of breath: Secondary | ICD-10-CM | POA: Diagnosis not present

## 2019-04-23 DIAGNOSIS — I10 Essential (primary) hypertension: Secondary | ICD-10-CM | POA: Diagnosis not present

## 2019-04-23 DIAGNOSIS — R5382 Chronic fatigue, unspecified: Secondary | ICD-10-CM | POA: Diagnosis not present

## 2019-04-23 DIAGNOSIS — E782 Mixed hyperlipidemia: Secondary | ICD-10-CM | POA: Diagnosis not present

## 2019-04-23 DIAGNOSIS — F419 Anxiety disorder, unspecified: Secondary | ICD-10-CM | POA: Diagnosis not present

## 2019-04-23 DIAGNOSIS — E1165 Type 2 diabetes mellitus with hyperglycemia: Secondary | ICD-10-CM | POA: Diagnosis not present

## 2019-04-30 DIAGNOSIS — I10 Essential (primary) hypertension: Secondary | ICD-10-CM | POA: Diagnosis not present

## 2019-05-17 DIAGNOSIS — J309 Allergic rhinitis, unspecified: Secondary | ICD-10-CM | POA: Diagnosis not present

## 2019-05-17 DIAGNOSIS — E782 Mixed hyperlipidemia: Secondary | ICD-10-CM | POA: Diagnosis not present

## 2019-05-17 DIAGNOSIS — R5382 Chronic fatigue, unspecified: Secondary | ICD-10-CM | POA: Diagnosis not present

## 2019-05-17 DIAGNOSIS — E1165 Type 2 diabetes mellitus with hyperglycemia: Secondary | ICD-10-CM | POA: Diagnosis not present

## 2019-05-17 DIAGNOSIS — E559 Vitamin D deficiency, unspecified: Secondary | ICD-10-CM | POA: Diagnosis not present

## 2019-05-17 DIAGNOSIS — F419 Anxiety disorder, unspecified: Secondary | ICD-10-CM | POA: Diagnosis not present

## 2019-05-17 DIAGNOSIS — N183 Chronic kidney disease, stage 3 (moderate): Secondary | ICD-10-CM | POA: Diagnosis not present

## 2019-05-17 DIAGNOSIS — R0602 Shortness of breath: Secondary | ICD-10-CM | POA: Diagnosis not present

## 2019-05-17 DIAGNOSIS — I10 Essential (primary) hypertension: Secondary | ICD-10-CM | POA: Diagnosis not present

## 2019-07-14 DIAGNOSIS — F419 Anxiety disorder, unspecified: Secondary | ICD-10-CM | POA: Diagnosis not present

## 2019-07-14 DIAGNOSIS — J309 Allergic rhinitis, unspecified: Secondary | ICD-10-CM | POA: Diagnosis not present

## 2019-07-14 DIAGNOSIS — E782 Mixed hyperlipidemia: Secondary | ICD-10-CM | POA: Diagnosis not present

## 2019-07-14 DIAGNOSIS — E1165 Type 2 diabetes mellitus with hyperglycemia: Secondary | ICD-10-CM | POA: Diagnosis not present

## 2019-07-14 DIAGNOSIS — I10 Essential (primary) hypertension: Secondary | ICD-10-CM | POA: Diagnosis not present

## 2019-07-14 DIAGNOSIS — E559 Vitamin D deficiency, unspecified: Secondary | ICD-10-CM | POA: Diagnosis not present

## 2019-07-14 DIAGNOSIS — R5382 Chronic fatigue, unspecified: Secondary | ICD-10-CM | POA: Diagnosis not present

## 2019-11-21 ENCOUNTER — Other Ambulatory Visit: Payer: Self-pay

## 2019-11-21 ENCOUNTER — Ambulatory Visit: Payer: Medicare HMO | Attending: Internal Medicine

## 2019-11-21 DIAGNOSIS — Z23 Encounter for immunization: Secondary | ICD-10-CM | POA: Insufficient documentation

## 2019-11-21 NOTE — Progress Notes (Signed)
   Covid-19 Vaccination Clinic  Name:  Kathryn Thomas    MRN: [id number] DOB: 1941-06-03  11/21/2019  [name] was observed post Covid-19 immunization for 15 minutes without incidence. She was provided with Vaccine Information Sheet and instruction to access the V-Safe system.   [name] was instructed to call 911 with any severe reactions post vaccine: Marland Kitchen Difficulty breathing  . Swelling of your face and throat  . A fast heartbeat  . A bad rash all over your body  . Dizziness and weakness    Immunizations Administered    Name Date Dose VIS Date Route   Pfizer COVID-19 Vaccine 11/21/2019 10:56 AM 0.3 mL 09/03/2019 Intramuscular   Manufacturer: [place]   Lot: [id number]   [place]: [id number]

## 2019-11-24 ENCOUNTER — Ambulatory Visit: Payer: Self-pay | Admitting: *Deleted

## 2019-11-24 NOTE — Telephone Encounter (Signed)
Pt calling on community line.  Pt states received covid vaccine Sunday. States vomiting since Sunday night. Reports "Mostly heaving, nothing comes up now." States she feels she is staying hydrated, drinking gatorade. Reports urine output down today, urine a little darker than usual. Also reports weakness. Advised UC. Pt has PCP. Advised to call PCP as pt hesitant to go to UC/ED. States she will  Call PCP.  Reason for Disposition . [1] Drinking very little AND [2] dehydration suspected (e.g., no urine > 12 hours, very dry mouth, very lightheaded)  Answer Assessment - Initial Assessment Questions 1. VOMITING SEVERITY: "How many times have you vomited in the past 24 hours?"     - MILD:  1 - 2 times/day    - MODERATE: 3 - 5 times/day, decreased oral intake without significant weight loss or symptoms of dehydration    - SEVERE: 6 or more times/day, vomits everything or nearly everything, with significant weight loss, symptoms of dehydration      "Nothing comes up, more lie heaving" 2. ONSET: "When did the vomiting begin?"      Sunday night 3. FLUIDS: "What fluids or food have you vomited up today?" "Have you been able to keep any fluids down?"    Clear liquid 4. ABDOMINAL PAIN: "Are your having any abdominal pain?" If yes : "How bad is it and what does it feel like?" (e.g., crampy, dull, intermittent, constant)      no 5. DIARRHEA: "Is there any diarrhea?" If so, ask: "How many times today?"      *No Answer* 6. CONTACTS: "Is there anyone else in the family with the same symptoms?"      no 7. CAUSE: "What do you think is causing your vomiting?"     covid vaccine 8. HYDRATION STATUS: "Any signs of dehydration?" (e.g., dry mouth [not only dry lips], too weak to stand) "When did you last urinate?"    States is drinking gatorade 9. OTHER SYMPTOMS: "Do you have any other symptoms?" (e.g., fever, headache, vertigo, vomiting blood or coffee grounds, recent head injury)     weakness  Protocols used:  [facility]

## 2019-11-25 ENCOUNTER — Inpatient Hospital Stay (HOSPITAL_BASED_OUTPATIENT_CLINIC_OR_DEPARTMENT_OTHER)
Admission: EM | Admit: 2019-11-25 | Discharge: 2019-12-01 | DRG: 418 | Disposition: A | Discharge status: Home / Self Care | Payer: Medicare HMO | Attending: Gastroenterology | Admitting: Gastroenterology

## 2019-11-25 ENCOUNTER — Other Ambulatory Visit: Payer: Self-pay

## 2019-11-25 ENCOUNTER — Encounter (HOSPITAL_BASED_OUTPATIENT_CLINIC_OR_DEPARTMENT_OTHER): Payer: Self-pay | Admitting: Emergency Medicine

## 2019-11-25 ENCOUNTER — Emergency Department (HOSPITAL_BASED_OUTPATIENT_CLINIC_OR_DEPARTMENT_OTHER): Payer: Medicare HMO

## 2019-11-25 DIAGNOSIS — Z8249 Family history of ischemic heart disease and other diseases of the circulatory system: Secondary | ICD-10-CM

## 2019-11-25 DIAGNOSIS — F329 Major depressive disorder, single episode, unspecified: Secondary | ICD-10-CM | POA: Diagnosis present

## 2019-11-25 DIAGNOSIS — I10 Essential (primary) hypertension: Secondary | ICD-10-CM | POA: Diagnosis not present

## 2019-11-25 DIAGNOSIS — F419 Anxiety disorder, unspecified: Secondary | ICD-10-CM | POA: Diagnosis present

## 2019-11-25 DIAGNOSIS — M1711 Unilateral primary osteoarthritis, right knee: Secondary | ICD-10-CM | POA: Diagnosis not present

## 2019-11-25 DIAGNOSIS — E876 Hypokalemia: Secondary | ICD-10-CM

## 2019-11-25 DIAGNOSIS — N179 Acute kidney failure, unspecified: Secondary | ICD-10-CM | POA: Diagnosis not present

## 2019-11-25 DIAGNOSIS — K82A1 Gangrene of gallbladder in cholecystitis: Secondary | ICD-10-CM | POA: Diagnosis not present

## 2019-11-25 DIAGNOSIS — Z823 Family history of stroke: Secondary | ICD-10-CM | POA: Diagnosis not present

## 2019-11-25 DIAGNOSIS — Z03818 Encounter for observation for suspected exposure to other biological agents ruled out: Secondary | ICD-10-CM | POA: Diagnosis not present

## 2019-11-25 DIAGNOSIS — K8050 Calculus of bile duct without cholangitis or cholecystitis without obstruction: Secondary | ICD-10-CM

## 2019-11-25 DIAGNOSIS — M549 Dorsalgia, unspecified: Secondary | ICD-10-CM | POA: Diagnosis present

## 2019-11-25 DIAGNOSIS — Z8049 Family history of malignant neoplasm of other genital organs: Secondary | ICD-10-CM

## 2019-11-25 DIAGNOSIS — D510 Vitamin B12 deficiency anemia due to intrinsic factor deficiency: Secondary | ICD-10-CM | POA: Diagnosis not present

## 2019-11-25 DIAGNOSIS — E538 Deficiency of other specified B group vitamins: Secondary | ICD-10-CM | POA: Diagnosis present

## 2019-11-25 DIAGNOSIS — K8020 Calculus of gallbladder without cholecystitis without obstruction: Secondary | ICD-10-CM | POA: Diagnosis not present

## 2019-11-25 DIAGNOSIS — K810 Acute cholecystitis: Secondary | ICD-10-CM | POA: Diagnosis not present

## 2019-11-25 DIAGNOSIS — Z20822 Contact with and (suspected) exposure to covid-19: Secondary | ICD-10-CM | POA: Diagnosis present

## 2019-11-25 DIAGNOSIS — G8929 Other chronic pain: Secondary | ICD-10-CM | POA: Diagnosis present

## 2019-11-25 DIAGNOSIS — K5900 Constipation, unspecified: Secondary | ICD-10-CM | POA: Diagnosis not present

## 2019-11-25 DIAGNOSIS — Z807 Family history of other malignant neoplasms of lymphoid, hematopoietic and related tissues: Secondary | ICD-10-CM | POA: Diagnosis not present

## 2019-11-25 DIAGNOSIS — Z01818 Encounter for other preprocedural examination: Secondary | ICD-10-CM

## 2019-11-25 DIAGNOSIS — R0600 Dyspnea, unspecified: Secondary | ICD-10-CM | POA: Diagnosis not present

## 2019-11-25 DIAGNOSIS — D72829 Elevated white blood cell count, unspecified: Secondary | ICD-10-CM

## 2019-11-25 DIAGNOSIS — R000 Tachycardia, unspecified: Secondary | ICD-10-CM | POA: Diagnosis not present

## 2019-11-25 DIAGNOSIS — I493 Ventricular premature depolarization: Secondary | ICD-10-CM | POA: Diagnosis not present

## 2019-11-25 DIAGNOSIS — R69 Illness, unspecified: Secondary | ICD-10-CM | POA: Diagnosis not present

## 2019-11-25 DIAGNOSIS — R932 Abnormal findings on diagnostic imaging of liver and biliary tract: Secondary | ICD-10-CM | POA: Diagnosis not present

## 2019-11-25 DIAGNOSIS — K8000 Calculus of gallbladder with acute cholecystitis without obstruction: Secondary | ICD-10-CM | POA: Diagnosis not present

## 2019-11-25 DIAGNOSIS — M25561 Pain in right knee: Secondary | ICD-10-CM

## 2019-11-25 DIAGNOSIS — Z79899 Other long term (current) drug therapy: Secondary | ICD-10-CM | POA: Diagnosis not present

## 2019-11-25 DIAGNOSIS — M25461 Effusion, right knee: Secondary | ICD-10-CM | POA: Diagnosis not present

## 2019-11-25 DIAGNOSIS — Z9049 Acquired absence of other specified parts of digestive tract: Secondary | ICD-10-CM | POA: Diagnosis not present

## 2019-11-25 DIAGNOSIS — R17 Unspecified jaundice: Secondary | ICD-10-CM | POA: Diagnosis not present

## 2019-11-25 DIAGNOSIS — R1110 Vomiting, unspecified: Secondary | ICD-10-CM | POA: Diagnosis present

## 2019-11-25 DIAGNOSIS — Z6838 Body mass index (BMI) 38.0-38.9, adult: Secondary | ICD-10-CM | POA: Diagnosis not present

## 2019-11-25 DIAGNOSIS — K831 Obstruction of bile duct: Secondary | ICD-10-CM | POA: Diagnosis not present

## 2019-11-25 DIAGNOSIS — M109 Gout, unspecified: Secondary | ICD-10-CM | POA: Diagnosis not present

## 2019-11-25 LAB — CBC WITH DIFFERENTIAL/PLATELET
Abs Immature Granulocytes: 0.47 10*3/uL — ABNORMAL HIGH (ref 0.00–0.07)
Basophils Absolute: 0.1 10*3/uL (ref 0.0–0.1)
Basophils Relative: 0 %
Eosinophils Absolute: 0.1 10*3/uL (ref 0.0–0.5)
Eosinophils Relative: 0 %
HCT: 48.4 % — ABNORMAL HIGH (ref 36.0–46.0)
Hemoglobin: 15.9 g/dL — ABNORMAL HIGH (ref 12.0–15.0)
Immature Granulocytes: 2 %
Lymphocytes Relative: 8 %
Lymphs Abs: 2.2 10*3/uL (ref 0.7–4.0)
MCH: 28.6 pg (ref 26.0–34.0)
MCHC: 32.9 g/dL (ref 30.0–36.0)
MCV: 87.2 fL (ref 80.0–100.0)
Monocytes Absolute: 1.8 10*3/uL — ABNORMAL HIGH (ref 0.1–1.0)
Monocytes Relative: 6 %
Neutro Abs: 23.8 10*3/uL — ABNORMAL HIGH (ref 1.7–7.7)
Neutrophils Relative %: 84 %
Platelets: 286 10*3/uL (ref 150–400)
RBC: 5.55 MIL/uL — ABNORMAL HIGH (ref 3.87–5.11)
RDW: 14.6 % (ref 11.5–15.5)
WBC: 28.3 10*3/uL — ABNORMAL HIGH (ref 4.0–10.5)
nRBC: 0 % (ref 0.0–0.2)

## 2019-11-25 LAB — COMPREHENSIVE METABOLIC PANEL
ALT: 15 U/L (ref 0–44)
AST: 20 U/L (ref 15–41)
Albumin: 3.4 g/dL — ABNORMAL LOW (ref 3.5–5.0)
Alkaline Phosphatase: 77 U/L (ref 38–126)
Anion gap: 12 (ref 5–15)
BUN: 22 mg/dL (ref 8–23)
CO2: 28 mmol/L (ref 22–32)
Calcium: 8.9 mg/dL (ref 8.9–10.3)
Chloride: 95 mmol/L — ABNORMAL LOW (ref 98–111)
Creatinine, Ser: 1.33 mg/dL — ABNORMAL HIGH (ref 0.44–1.00)
GFR calc Af Amer: 44 mL/min — ABNORMAL LOW (ref >60)
GFR calc non Af Amer: 38 mL/min — ABNORMAL LOW (ref >60)
Glucose, Bld: 206 mg/dL — ABNORMAL HIGH (ref 70–99)
Potassium: 3.3 mmol/L — ABNORMAL LOW (ref 3.5–5.1)
Sodium: 135 mmol/L (ref 135–145)
Total Bilirubin: 2.3 mg/dL — ABNORMAL HIGH (ref 0.3–1.2)
Total Protein: 7.7 g/dL (ref 6.5–8.1)

## 2019-11-25 LAB — URINALYSIS, ROUTINE W REFLEX MICROSCOPIC
Glucose, UA: NEGATIVE mg/dL
Ketones, ur: NEGATIVE mg/dL
Leukocytes,Ua: NEGATIVE
Nitrite: NEGATIVE
Protein, ur: 30 mg/dL — AB
Specific Gravity, Urine: <1.005 — ABNORMAL LOW (ref 1.005–1.030)
pH: 6.5 (ref 5.0–8.0)

## 2019-11-25 LAB — URINALYSIS, MICROSCOPIC (REFLEX)

## 2019-11-25 LAB — TROPONIN I (HIGH SENSITIVITY)
Troponin I (High Sensitivity): 15 ng/L (ref <18)
Troponin I (High Sensitivity): 13 ng/L (ref <18)

## 2019-11-25 LAB — RESPIRATORY PANEL BY RT PCR (FLU A&B, COVID)
Influenza A by PCR: NEGATIVE
Influenza B by PCR: NEGATIVE
SARS Coronavirus 2 by RT PCR: NEGATIVE

## 2019-11-25 LAB — MAGNESIUM: Magnesium: 1.5 mg/dL — ABNORMAL LOW (ref 1.7–2.4)

## 2019-11-25 LAB — LACTIC ACID, PLASMA: Lactic Acid, Venous: 1.6 mmol/L (ref 0.5–1.9)

## 2019-11-25 LAB — LIPASE, BLOOD: Lipase: 18 U/L (ref 11–51)

## 2019-11-25 MED ORDER — SODIUM CHLORIDE 0.9 % IV BOLUS
1000.0000 mL | Freq: Once | INTRAVENOUS | Status: AC
  Administered 2019-11-25: 09:00:00 1000 mL via INTRAVENOUS

## 2019-11-25 MED ORDER — FENTANYL CITRATE (PF) 100 MCG/2ML IJ SOLN
100.0000 ug | Freq: Once | INTRAMUSCULAR | Status: AC
  Administered 2019-11-25: 100 ug via INTRAVENOUS
  Filled 2019-11-25: qty 2

## 2019-11-25 MED ORDER — LISINOPRIL 10 MG PO TABS
10.0000 mg | ORAL_TABLET | Freq: Every day | ORAL | Status: DC
  Filled 2019-11-25: qty 1

## 2019-11-25 MED ORDER — IOHEXOL 300 MG/ML  SOLN
100.0000 mL | Freq: Once | INTRAMUSCULAR | Status: AC | PRN
  Administered 2019-11-25: 100 mL via INTRAVENOUS

## 2019-11-25 MED ORDER — PIPERACILLIN-TAZOBACTAM 3.375 G IVPB 30 MIN
3.3750 g | Freq: Once | INTRAVENOUS | Status: AC
  Administered 2019-11-25: 3.375 g via INTRAVENOUS
  Filled 2019-11-25 (×2): qty 50

## 2019-11-25 MED ORDER — PANTOPRAZOLE SODIUM 40 MG IV SOLR
40.0000 mg | Freq: Every day | INTRAVENOUS | Status: DC
  Administered 2019-11-25: 40 mg via INTRAVENOUS
  Filled 2019-11-25: qty 40

## 2019-11-25 MED ORDER — DIPHENHYDRAMINE HCL 50 MG/ML IJ SOLN: 12.5000 mg | Freq: Four times a day (QID) | INTRAMUSCULAR | Status: DC | PRN

## 2019-11-25 MED ORDER — BUPROPION HCL ER (SR) 150 MG PO TB12
150.0000 mg | ORAL_TABLET | Freq: Two times a day (BID) | ORAL | Status: DC
  Administered 2019-11-25: 150 mg via ORAL
  Filled 2019-11-25: qty 1

## 2019-11-25 MED ORDER — AMLODIPINE BESYLATE 5 MG PO TABS
2.5000 mg | ORAL_TABLET | Freq: Every day | ORAL | Status: DC
  Filled 2019-11-25: qty 1

## 2019-11-25 MED ORDER — HYDROCHLOROTHIAZIDE 12.5 MG PO CAPS
12.5000 mg | ORAL_CAPSULE | Freq: Every day | ORAL | Status: DC
  Filled 2019-11-25: qty 1

## 2019-11-25 MED ORDER — ONDANSETRON HCL 4 MG/2ML IJ SOLN
4.0000 mg | Freq: Once | INTRAMUSCULAR | Status: AC
  Administered 2019-11-25: 4 mg via INTRAVENOUS
  Filled 2019-11-25: qty 2

## 2019-11-25 MED ORDER — ACETAMINOPHEN 500 MG PO TABS
1000.0000 mg | ORAL_TABLET | Freq: Four times a day (QID) | ORAL | Status: DC
  Administered 2019-11-25 – 2019-11-26 (×2): 1000 mg via ORAL
  Filled 2019-11-25 (×2): qty 2

## 2019-11-25 MED ORDER — DIPHENHYDRAMINE HCL 12.5 MG/5ML PO ELIX: 12.5000 mg | ORAL_SOLUTION | Freq: Four times a day (QID) | ORAL | Status: DC | PRN

## 2019-11-25 MED ORDER — ONDANSETRON HCL 4 MG/2ML IJ SOLN
4.0000 mg | INTRAMUSCULAR | Status: DC | PRN
  Administered 2019-11-26: 4 mg via INTRAVENOUS

## 2019-11-25 MED ORDER — PIPERACILLIN-TAZOBACTAM 3.375 G IVPB
3.3750 g | Freq: Three times a day (TID) | INTRAVENOUS | Status: DC
  Administered 2019-11-25 – 2019-11-26 (×3): 3.375 g via INTRAVENOUS
  Filled 2019-11-25 (×3): qty 50

## 2019-11-25 MED ORDER — MAGNESIUM SULFATE 2 GM/50ML IV SOLN
2.0000 g | Freq: Once | INTRAVENOUS | Status: AC
  Administered 2019-11-25: 2 g via INTRAVENOUS
  Filled 2019-11-25: qty 50

## 2019-11-25 MED ORDER — SODIUM CHLORIDE 0.9 % IV SOLN: INTRAVENOUS | Status: DC

## 2019-11-25 MED ORDER — MORPHINE SULFATE (PF) 2 MG/ML IV SOLN
1.0000 mg | INTRAVENOUS | Status: DC | PRN
  Administered 2019-11-25 – 2019-11-26 (×5): 2 mg via INTRAVENOUS
  Filled 2019-11-25 (×4): qty 1

## 2019-11-25 MED ORDER — VITAMIN D (ERGOCALCIFEROL) 1.25 MG (50000 UNIT) PO CAPS
50000.0000 [IU] | ORAL_CAPSULE | ORAL | Status: DC
  Filled 2019-11-25: qty 1

## 2019-11-25 MED ORDER — MORPHINE SULFATE (PF) 2 MG/ML IV SOLN
INTRAVENOUS | Status: AC
  Filled 2019-11-25: qty 1

## 2019-11-25 MED ORDER — ENOXAPARIN SODIUM 40 MG/0.4ML ~~LOC~~ SOLN
40.0000 mg | SUBCUTANEOUS | Status: DC
  Administered 2019-11-25: 40 mg via SUBCUTANEOUS
  Filled 2019-11-25: qty 0.4

## 2019-11-25 MED ORDER — POTASSIUM CHLORIDE IN NACL 40-0.9 MEQ/L-% IV SOLN
INTRAVENOUS | Status: DC
  Administered 2019-11-25: 100 mL/h via INTRAVENOUS
  Filled 2019-11-25 (×2): qty 1000

## 2019-11-25 MED ORDER — MORPHINE SULFATE (PF) 4 MG/ML IV SOLN
4.0000 mg | Freq: Once | INTRAVENOUS | Status: AC
  Administered 2019-11-25: 4 mg via INTRAVENOUS
  Filled 2019-11-25: qty 1

## 2019-11-25 MED ORDER — SODIUM CHLORIDE 0.9 % IV SOLN: INTRAVENOUS | Status: DC | PRN

## 2019-11-25 MED ORDER — POTASSIUM CHLORIDE CRYS ER 20 MEQ PO TBCR
40.0000 meq | EXTENDED_RELEASE_TABLET | Freq: Two times a day (BID) | ORAL | Status: AC
  Administered 2019-11-25: 40 meq via ORAL
  Filled 2019-11-25 (×2): qty 2

## 2019-11-25 MED ORDER — LACTATED RINGERS IV BOLUS
1000.0000 mL | Freq: Once | INTRAVENOUS | Status: AC
  Administered 2019-11-25: 1000 mL via INTRAVENOUS

## 2019-11-25 MED ORDER — ALPRAZOLAM 0.25 MG PO TABS
0.2500 mg | ORAL_TABLET | Freq: Two times a day (BID) | ORAL | Status: DC | PRN
  Administered 2019-11-25: 0.25 mg via ORAL
  Filled 2019-11-25: qty 1

## 2019-11-25 MED ORDER — ZOLPIDEM TARTRATE 5 MG PO TABS: 5.0000 mg | ORAL_TABLET | Freq: Every evening | ORAL | Status: DC | PRN

## 2019-11-25 MED ORDER — POTASSIUM CHLORIDE IN NACL 20-0.9 MEQ/L-% IV SOLN
INTRAVENOUS | Status: DC
  Filled 2019-11-25: qty 1000

## 2019-11-25 NOTE — Progress Notes (Signed)
Report received from [place] RN in ED. Awaiting patient.

## 2019-11-25 NOTE — ED Triage Notes (Addendum)
Vomiting since Monday evening. Had 1st COVID vaccine on Sunday. Also endorses generalized abd pain

## 2019-11-25 NOTE — ED Provider Notes (Signed)
[place] EMERGENCY DEPARTMENT Provider Note   CSN: [id number] Arrival date & time: 11/25/19  4097     History Chief Complaint  Patient presents with  . Emesis    Kathryn Thomas is a 79 y.o. female.  79yo F w/ PMH including perniciousanemia, gastritis, hysterectomy who p/w vomiting and abd pain. Pt had 1st COVID-19 vaccination 3 days ago. That evening, she began having vomiting which has persisted since then and she has been unable to tolerate anything by mouth. She reports associated constant abd pain that begins across upper abdomen and radiates down to lower abdomen. Pain is worse with moving around/walking. She cannot recall last BM; denies diarrhea. No fevers, urinary symptoms, sore throat, cough, or sick contacts. No CP or SOB. No meds PTA.  The history is provided by the patient.  Emesis      Past Medical History:  Diagnosis Date  . Acute renal failure ([place])   . Anal stenosis 2010  . Anxiety   . Blood transfusion    " no reaction to transfusion "  . Depression   . External hemorrhoids   . Gastric polyp   . Gastritis   . Hypertension   . Intestinal metaplasia of gastric mucosa 09/2011   high grade dysplasia  . Pernicious anemia   . Shortness of breath   . Vitamin B12 deficiency     Patient Active Problem List   Diagnosis Date Noted  . Dyspnea on exertion 06/10/2013  . Microscopic hematuria 06/10/2013  . Precordial pain 05/13/2013  . Anal stenosis   . Pernicious anemia 10/01/2011  . Multiple gastric polyps 10/01/2011  . B12 deficiency anemia 10/01/2011  . Anemia 09/28/2011  . Weight loss 09/28/2011  . Positional lightheadedness 09/28/2011  . UTI (lower urinary tract infection) 09/28/2011    Past Surgical History:  Procedure Laterality Date  . ABDOMINAL HYSTERECTOMY    . BACK SURGERY  1980   three lower spine surgeries.  . ESOPHAGOGASTRODUODENOSCOPY  10/01/2011   Procedure: ESOPHAGOGASTRODUODENOSCOPY (EGD);  Surgeon: Lafayette Dragon, MD;   Location: [facility] ENDOSCOPY;  Service: Endoscopy;  Laterality: N/A;     OB History   No obstetric history on file.     Family History  Problem Relation Age of Onset  . Cervical cancer Mother   . Heart disease Mother        died of CHF  . Heart disease Father        died at 56 of heart attack  . Multiple myeloma Sister        spleen involvement  . Cancer Sister        bone marrow  . Stroke Sister   . Colon cancer Neg Hx     Social History   Tobacco Use  . Smoking status: Never Smoker  . Smokeless tobacco: Never Used  Substance Use Topics  . Alcohol use: No  . Drug use: No    Home Medications Prior to Admission medications   Medication Sig Start Date End Date Taking? Authorizing Provider  ALPRAZolam (XANAX) 0.25 MG tablet TAKE ONE TABLET BY MOUTH TWICE DAILY AS NEEDED FOR ANXIETY 10/28/14   Robyn Haber, MD  buPROPion [facility] SR) 150 MG 12 hr tablet Take 1 tablet (150 mg total) by mouth 2 (two) times daily. 02/20/14   Robyn Haber, MD  cephALEXin (KEFLEX) 500 MG capsule Take 1 capsule (500 mg total) by mouth 2 (two) times daily. 11/28/16   Forde Dandy, MD  Cyanocobalamin (VITAMIN B-12  IJ) Inject 1 mL as directed every 30 (thirty) days.    [provider]  fluticasone (FLONASE) 50 MCG/ACT nasal spray Place 2 sprays into both nostrils daily. 12/28/13   Orma Flaming, MD  hydrochlorothiazide (HYDRODIURIL) 12.5 MG tablet 12.5 mg daily. Take daily for swelling 05/05/13   Copland, Gay Filler, MD  hydrochlorothiazide (MICROZIDE) 12.5 MG capsule Take 1 capsule (12.5 mg total) by mouth daily. 07/07/14   Harrison Mons, PA  ibuprofen (ADVIL,MOTRIN) 600 MG tablet Take 1 tablet (600 mg total) by mouth every 8 (eight) hours as needed. NO MORE REFILLS WITHOUT OFFICE VISIT - 2ND NOTICE 08/12/14   Posey Boyer, MD  lisinopril (PRINIVIL,ZESTRIL) 10 MG tablet TAKE ONE TABLET BY MOUTH ONCE DAILY 07/06/14   Robyn Haber, MD  traMADol (ULTRAM) 50 MG tablet Take 1 tablet (50 mg  total) by mouth every 12 (twelve) hours as needed. 02/12/18   Hedges, Dellis Filbert, PA-C  Vitamin D, Ergocalciferol, (DRISDOL) 50000 UNITS CAPS capsule Take 50,000 Units by mouth every 7 (seven) days. On Wednesday.    [provider]    Allergies    Patient has no known allergies.  Review of Systems   Review of Systems  Gastrointestinal: Positive for vomiting.   All other systems reviewed and are negative except that which was mentioned in HPI  Physical Exam Updated Vital Signs BP 125/80   Pulse (!) 40   Temp 99.3 F (37.4 C) (Oral)   Resp (!) 22   Ht '5\' 7"'  (1.702 m)   Wt 110.7 kg   SpO2 96%   BMI 38.22 kg/m   Physical Exam Vitals and nursing note reviewed.  Constitutional:      General: She is not in acute distress.    Appearance: She is well-developed.     Comments: Very uncomfortable, holding emesis bag  HENT:     Head: Normocephalic and atraumatic.  Eyes:     Conjunctiva/sclera: Conjunctivae normal.  Cardiovascular:     Rate and Rhythm: Regular rhythm. Bradycardia present.     Heart sounds: Normal heart sounds. No murmur.  Pulmonary:     Effort: Pulmonary effort is normal.     Breath sounds: Normal breath sounds.  Abdominal:     General: Bowel sounds are normal. There is distension.     Palpations: Abdomen is soft.     Tenderness: There is generalized abdominal tenderness. There is guarding.     Comments: Mild abd distension with generalized ttp, +peritonitis, + involuntary guarding  Musculoskeletal:     Cervical back: Neck supple.  Skin:    General: Skin is warm and dry.  Neurological:     Mental Status: She is alert and oriented to person, place, and time.     Comments: Fluent speech  Psychiatric:        Judgment: Judgment normal.     ED Results / Procedures / Treatments   Labs (all labs ordered are listed, but only abnormal results are displayed) Labs Reviewed  COMPREHENSIVE METABOLIC PANEL - Abnormal; Notable for the following components:       Result Value   Potassium 3.3 (*)    Chloride 95 (*)    Glucose, Bld 206 (*)    Creatinine, Ser 1.33 (*)    Albumin 3.4 (*)    Total Bilirubin 2.3 (*)    GFR calc non Af Amer 38 (*)    GFR calc Af Amer 44 (*)    All other components within normal limits  CBC WITH  DIFFERENTIAL/PLATELET - Abnormal; Notable for the following components:   WBC 28.3 (*)    RBC 5.55 (*)    Hemoglobin 15.9 (*)    HCT 48.4 (*)    Neutro Abs 23.8 (*)    Monocytes Absolute 1.8 (*)    Abs Immature Granulocytes 0.47 (*)    All other components within normal limits  URINALYSIS, ROUTINE W REFLEX MICROSCOPIC - Abnormal; Notable for the following components:   APPearance HAZY (*)    Specific Gravity, Urine <1.005 (*)    Hgb urine dipstick SMALL (*)    Bilirubin Urine SMALL (*)    Protein, ur 30 (*)    All other components within normal limits  URINALYSIS, MICROSCOPIC (REFLEX) - Abnormal; Notable for the following components:   Bacteria, UA MANY (*)    All other components within normal limits  CULTURE, BLOOD (ROUTINE X 2)  CULTURE, BLOOD (ROUTINE X 2)  RESPIRATORY PANEL BY RT PCR (FLU A&B, COVID)  LIPASE, BLOOD  LACTIC ACID, PLASMA  TROPONIN I (HIGH SENSITIVITY)    EKG EKG Interpretation  Date/Time:  Thursday November 25 2019 08:42:05 EST Ventricular Rate:  111 PR Interval:    QRS Duration: 93 QT Interval:  368 QTC Calculation: 392 R Axis:   38 Text Interpretation: Sinus tachycardia Ventricular bigeminy Borderline repolarization abnormality ST elevation, consider inferior injury ventricular bigeminy similar to previous ventricular trigeminy, no ischemic changes Confirmed by Theotis Burrow [id number]) on 11/25/2019 8:49:04 AM   Radiology CT Abdomen Pelvis W Contrast  Result Date: 11/25/2019 CLINICAL DATA:  Abdominal abscess or infection. EXAM: CT ABDOMEN AND PELVIS WITH CONTRAST TECHNIQUE: Multidetector CT imaging of the abdomen and pelvis was performed using the standard protocol following bolus  administration of intravenous contrast. CONTRAST:  80 mL OMNIPAQUE IOHEXOL 300 MG/ML  SOLN COMPARISON:  June 15, 2013. FINDINGS: Lower chest: No acute abnormality. Hepatobiliary: There is a large gallstone noted in the neck of the gallbladder. The gallbladder is dilated with thickened wall and surrounding inflammatory change consistent with acute cholecystitis. No biliary dilatation is noted. No focal abnormality is noted in the liver. Pancreas: Unremarkable. No pancreatic ductal dilatation or surrounding inflammatory changes. Spleen: Normal in size without focal abnormality. Adrenals/Urinary Tract: Adrenal glands are unremarkable. Kidneys are normal, without renal calculi, focal lesion, or hydronephrosis. Bladder is unremarkable. Stomach/Bowel: Stomach is within normal limits. Appendix appears normal. No evidence of bowel wall thickening, distention, or inflammatory changes. Vascular/Lymphatic: Aortic atherosclerosis. No enlarged abdominal or pelvic lymph nodes. Reproductive: Status post hysterectomy. No adnexal masses. Other: No abdominal wall hernia or abnormality. No abdominopelvic ascites. Musculoskeletal: No acute or significant osseous findings. IMPRESSION: Findings consistent with acute cholecystitis. Large gallstone is noted in the neck of the gallbladder. No biliary dilatation is noted. Aortic Atherosclerosis (ICD10-I70.0). Electronically Signed   By: Marijo Conception M.D.   On: 11/25/2019 10:14    Procedures Procedures (including critical care time)  Medications Ordered in ED Medications  0.9 %  sodium chloride infusion ( Intravenous Stopped 11/25/19 1059)  ondansetron (ZOFRAN) injection 4 mg (4 mg Intravenous Given 11/25/19 0854)  fentaNYL (SUBLIMAZE) injection 100 mcg (100 mcg Intravenous Given 11/25/19 0854)  sodium chloride 0.9 % bolus 1,000 mL (0 mLs Intravenous Stopped 11/25/19 1021)  morphine 4 MG/ML injection 4 mg (4 mg Intravenous Given 11/25/19 0932)  lactated ringers bolus 1,000 mL  (1,000 mLs Intravenous New Bag/Given 11/25/19 1022)  iohexol (OMNIPAQUE) 300 MG/ML solution 100 mL (100 mLs Intravenous Contrast Given 11/25/19 0938)  piperacillin-tazobactam (ZOSYN) IVPB 3.375  g (0 g Intravenous Stopped 11/25/19 1059)    ED Course  I have reviewed the triage vital signs and the nursing notes.  Pertinent labs & imaging results that were available during my care of the patient were reviewed by me and considered in my medical decision making (see chart for details).    MDM Rules/Calculators/A&P                      Pt's abd exam concerning w/ peritonitis and guarding. DDx is broad and includes perforated viscous from PUD, cholecystitis, SBO, pancreatitis, malignancy. Obtained labs and CT abd/pelvis. Gave IVF, fentanyl, zofran.  Lab work shows mild AKI with creatinine 1.33, normal lipase, normal LFTs, bilirubin 2.3, normal lactate, normal troponin, WBC 28.3.  CT shows acute cholecystitis with gallstone stuck in gallbladder neck.  No biliary dilatation.  I discussed findings with general surgeon on-call at [facility], Dr. Harlow Asa, who has requested ED to ED transfer so that their team may evaluate the patient and potentially operate this afternoon.  I have sent rapid Covid testing in preparation.  Discussed transfer with Dr. Ralene Bathe. Updated pt and family on transfer and admission plan.  Final Clinical Impression(s) / ED Diagnoses Final diagnoses:  Acute cholecystitis    Rx / DC Orders ED Discharge Orders    None       Annahi Short, Wenda Overland, MD 11/25/19 1118

## 2019-11-25 NOTE — Plan of Care (Signed)

## 2019-11-25 NOTE — ED Notes (Signed)
Pt daughter Hassan Rowan updated by Dr. Rex Kras

## 2019-11-25 NOTE — ED Notes (Signed)
Updated pt daughter Hassan Rowan.

## 2019-11-25 NOTE — ED Provider Notes (Signed)
  Physical Exam  BP 112/72   Pulse 62   Temp 99.3 F (37.4 C) (Oral)   Resp 20   Ht 5\' 7"  (1.702 m)   Wt 110.7 kg   SpO2 92%   BMI 38.22 kg/m   Physical Exam Constitutional:      General: She is not in acute distress.    Appearance: Normal appearance. She is well-developed. She is obese. She is not ill-appearing or diaphoretic.  HENT:     Head: Normocephalic and atraumatic.     Right Ear: External ear normal.     Left Ear: External ear normal.     Nose: Nose normal.  Eyes:     General: Vision grossly intact. Gaze aligned appropriately.     Pupils: Pupils are equal, round, and reactive to light.  Neck:     Trachea: Trachea and phonation normal. No tracheal deviation.  Pulmonary:     Effort: Pulmonary effort is normal. No respiratory distress.  Abdominal:     General: There is no distension.     Palpations: Abdomen is soft.     Tenderness: There is abdominal tenderness. There is guarding. There is no rebound.  Musculoskeletal:        General: Normal range of motion.     Cervical back: Normal range of motion.  Skin:    General: Skin is warm and dry.  Neurological:     Mental Status: She is alert.     GCS: GCS eye subscore is 4. GCS verbal subscore is 5. GCS motor subscore is 6.     Comments: Speech is clear and goal oriented, follows commands Major Cranial nerves without deficit, no facial droop Moves extremities without ataxia, coordination intact  Psychiatric:        Behavior: Behavior normal.    ED Course/Procedures     Procedures  MDM  79 year old female arrives via [facility] for general surgery evaluation for acute cholecystitis confirmed by CT scan.  Please see previous provider Dr. Eddie Dibbles note for full details. - On arrival patient is well-appearing, pleasant and in no acute distress.  Vital signs are stable, noted to be in bigeminy on EKG for med [facility] and again here.  She has no chest pain or shortness of breath.  No tachycardia, hypoxia,  hypotension or tachypnea.  Consult was called to general surgery and I spoke with physician assistant Will who is coming to evaluate patient. - Patient has been admitted to the general surgery for further evaluation management.  Patient's case discussed with Dr. Ralene Bathe during this visit.  Note: Portions of this report may have been transcribed using voice recognition software. Every effort was made to ensure accuracy; however, inadvertent computerized transcription errors may still be present.   Gari Crown 11/25/19 1345    Quintella Reichert, MD 11/26/19 1104

## 2019-11-25 NOTE — ED Notes (Signed)
Pt O2 sats 85% on RA. RT to bedside. Pt placed on O2 2lpm via nasal cannula.

## 2019-11-25 NOTE — Consult Note (Signed)
Cardiology Consultation:  Patient ID: Kathryn Thomas MRN: [id number]; DOB: 05-Mar-1941  Admit date: 11/25/2019 Date of Consult: 11/25/2019  Primary Care Provider: Benito Mccreedy, MD Primary Cardiologist: No primary care provider on file.   Patient Profile:  Kathryn Thomas is a 79 y.o. female with a hx of hypertension, obesity who is being seen today for the evaluation of shortness of breath/preoperative assessment at the request of Earnstine Regal, PD.  History of Present Illness:  [name] presents with 2 days of worsening nausea vomiting and abdominal pain.  She reports that she been unable to eat anything for the past few days.  She reports that she is had profound abdominal pain intermittently at rest.  The pain starts in her upper epigastric area and radiates down to her pubis area.  She reports she is been unable to tolerate any p.o. intake.  She reports recently getting a coronavirus vaccine.  She presented to [facility] originally.  White count noted to be 28,000, CT scan consistent with acute cholecystitis.  Her EKG demonstrates ventricular bigeminy.  Her potassium was noted to be 3.3.  Creatinine slightly up at 1.33, lactic acid 1.6, hemoglobin 15.9.  Initial high-sensitivity troponin was negative at a value of 15.  Regarding cardiovascular disease risk factors her main risk factors are activity, obesity and hypertension.  She reports she has been inactive for years.  She reports she just does not do much because she gets tired quickly.  This is been stable for at least 2 years from what she tells me.  She mainly does activities around the house but does not venture out much.  When she does do activity on how she gets no chest pain with this.  She does report getting easily fatigued and short of breath but this appears to be stable and has not changed recently.  The rather acute change in her picture is the acute onset of nausea vomiting as well as a diagnosis of acute  cholecystitis.  Of note, she underwent a cardiac stress test in 2014.  That was normal.  She has no history of myocardial infarction, stroke, congestive heart failure, CKD, diabetes that requires insulin.  Heart Pathway Score:       Past Medical History: Past Medical History:  Diagnosis Date  . Acute renal failure ([place])   . Anal stenosis 2010  . Anxiety   . Blood transfusion    " no reaction to transfusion "  . Depression   . External hemorrhoids   . Gastric polyp   . Gastritis   . Hypertension   . Intestinal metaplasia of gastric mucosa 09/2011   high grade dysplasia  . Pernicious anemia   . Shortness of breath   . Vitamin B12 deficiency     Past Surgical History: Past Surgical History:  Procedure Laterality Date  . ABDOMINAL HYSTERECTOMY    . BACK SURGERY  1980   three lower spine surgeries.  . ESOPHAGOGASTRODUODENOSCOPY  10/01/2011   Procedure: ESOPHAGOGASTRODUODENOSCOPY (EGD);  Surgeon: Lafayette Dragon, MD;  Location: [facility] ENDOSCOPY;  Service: Endoscopy;  Laterality: N/A;     Home Medications:  Prior to Admission medications   Medication Sig Start Date End Date Taking? Authorizing Provider  ALPRAZolam Duanne Moron) 0.5 MG tablet Take 0.25 mg by mouth 2 (two) times daily as needed for anxiety. 10/27/19  Yes [provider]  amLODipine (NORVASC) 2.5 MG tablet Take 2.5 mg by mouth daily. 09/04/19  Yes [provider]  buPROPion [facility]  SR) 150 MG 12 hr tablet Take 1 tablet (150 mg total) by mouth 2 (two) times daily. 02/20/14  Yes Robyn Haber, MD  celecoxib (CELEBREX) 200 MG capsule Take 200 mg by mouth 2 (two) times daily. 10/28/19  Yes [provider]  hydrochlorothiazide (MICROZIDE) 12.5 MG capsule Take 1 capsule (12.5 mg total) by mouth daily. 07/07/14  Yes Jeffery, Chelle, PA  lisinopril (PRINIVIL,ZESTRIL) 10 MG tablet TAKE ONE TABLET BY MOUTH ONCE DAILY Patient taking differently: Take 10 mg by mouth daily.  07/06/14  Yes Robyn Haber, MD    Vitamin D, Ergocalciferol, (DRISDOL) 50000 UNITS CAPS capsule Take 50,000 Units by mouth every 7 (seven) days. On Wednesday.   Yes [provider]  ALPRAZolam (XANAX) 0.25 MG tablet TAKE ONE TABLET BY MOUTH TWICE DAILY AS NEEDED FOR ANXIETY Patient not taking: Reported on 11/25/2019 10/28/14   Robyn Haber, MD  cephALEXin (KEFLEX) 500 MG capsule Take 1 capsule (500 mg total) by mouth 2 (two) times daily. Patient not taking: Reported on 11/25/2019 11/28/16   Forde Dandy, MD  fluticasone [facility]) 50 MCG/ACT nasal spray Place 2 sprays into both nostrils daily. Patient not taking: Reported on 11/25/2019 12/28/13   Orma Flaming, MD  ibuprofen (ADVIL,MOTRIN) 600 MG tablet Take 1 tablet (600 mg total) by mouth every 8 (eight) hours as needed. NO MORE REFILLS WITHOUT OFFICE VISIT - 2ND NOTICE Patient not taking: Reported on 11/25/2019 08/12/14   Posey Boyer, MD  traMADol (ULTRAM) 50 MG tablet Take 1 tablet (50 mg total) by mouth every 12 (twelve) hours as needed. Patient not taking: Reported on 11/25/2019 02/12/18   Okey Regal, PA-C    Inpatient Medications: Scheduled Meds: . acetaminophen  1,000 mg Oral Q6H  . amLODipine  2.5 mg Oral Daily  . buPROPion  150 mg Oral BID  . enoxaparin (LOVENOX) injection  40 mg Subcutaneous Q24H  . hydrochlorothiazide  12.5 mg Oral Daily  . lisinopril  10 mg Oral Daily  . morphine      . pantoprazole (PROTONIX) IV  40 mg Intravenous QHS  . Vitamin D (Ergocalciferol)  50,000 Units Oral Q7 days   Continuous Infusions: . 0.9 % NaCl with KCl 20 mEq / L    . magnesium sulfate bolus IVPB    . piperacillin-tazobactam (ZOSYN)  IV     PRN Meds: ALPRAZolam, diphenhydrAMINE **OR** diphenhydrAMINE, morphine injection, ondansetron (ZOFRAN) IV, zolpidem  Allergies:    No Known Allergies  Social History:   Social History   Socioeconomic History  . Marital status: Widowed    Spouse name: Not on file  . Number of children: 2  . Years of education: Not on  file  . Highest education level: Not on file  Occupational History  . Occupation: Retired  Tobacco Use  . Smoking status: Never Smoker  . Smokeless tobacco: Never Used  Substance and Sexual Activity  . Alcohol use: No  . Drug use: No  . Sexual activity: Not on file  Other Topics Concern  . Not on file  Social History Narrative   Patient is widowed 7 years and lives alone within 5 minutes of her two daughters. She is unemployed. Has medicare. Has a good social support with family and church friends.    Social Determinants of Health   Financial Resource Strain:   . Difficulty of Paying Living Expenses: Not on file  Food Insecurity:   . Worried About Charity fundraiser in the Last Year: Not on file  .  Ran Out of Food in the Last Year: Not on file  Transportation Needs:   . Lack of Transportation (Medical): Not on file  . Lack of Transportation (Non-Medical): Not on file  Physical Activity:   . Days of Exercise per Week: Not on file  . Minutes of Exercise per Session: Not on file  Stress:   . Feeling of Stress : Not on file  Social Connections:   . Frequency of Communication with Friends and Family: Not on file  . Frequency of Social Gatherings with Friends and Family: Not on file  . Attends Religious Services: Not on file  . Active Member of Clubs or Organizations: Not on file  . Attends Archivist Meetings: Not on file  . Marital Status: Not on file  Intimate Partner Violence:   . Fear of Current or Ex-Partner: Not on file  . Emotionally Abused: Not on file  . Physically Abused: Not on file  . Sexually Abused: Not on file     Family History:    Family History  Problem Relation Age of Onset  . Cervical cancer Mother   . Heart disease Mother        died of CHF  . Heart disease Father        died at 25 of heart attack  . Multiple myeloma Sister        spleen involvement  . Cancer Sister        bone marrow  . Stroke Sister   . Colon cancer Neg Hx       ROS:  All other ROS reviewed and negative. Pertinent positives noted in the HPI.     Physical Exam/Data:   Vitals:   11/25/19 1100 11/25/19 1130 11/25/19 1200 11/25/19 1300  BP: 125/80 120/69 (!) 120/53 112/72  Pulse: (!) 40 81 (!) 47 62  Resp: (!) 22 (!) '23 18 20  ' Temp:      TempSrc:      SpO2: 96% 93% 95% 92%  Weight:      Height:         Intake/Output Summary (Last 24 hours) at 11/25/2019 1447 Last data filed at 11/25/2019 1130 Gross per 24 hour  Intake 2055.93 ml  Output --  Net 2055.93 ml    Last 3 Weights 11/25/2019 04/09/2019 11/28/2016  Weight (lbs) 244 lb 245 lb 246 lb  Weight (kg) 110.678 kg 111.131 kg 111.585 kg    Body mass index is 38.22 kg/m.  General: Ill-appearing Head: Atraumatic, normal size  Eyes: PEERLA, EOMI  Neck: Supple, no JVD Endocrine: No thryomegaly Cardiac: Irregular rhythm, no murmurs rubs or gallops Lungs: Clear to auscultation bilaterally, no wheezing, rhonchi or rales  Abd: Distended abdomen, exquisitely tender to palpation Ext: No edema, pulses 2+ Musculoskeletal: No deformities, BUE and BLE strength normal and equal Skin: Warm and dry, no rashes   Neuro: Alert and oriented to person, place, time, and situation, CNII-XII grossly intact, no focal deficits  Psych: Normal mood and affect   EKG:  The EKG was personally reviewed and demonstrates: Normal sinus rhythm with ventricular bigeminy, there is no ST elevation present Telemetry:  Telemetry was personally reviewed and demonstrates: Normal sinus rhythm with circular bigeminy  Relevant CV Studies: Nuclear medicine cardiac stress test 2014 -normal without evidence of ischemia  Laboratory Data: High Sensitivity Troponin:   Recent Labs  Lab 11/25/19 0846  TROPONINIHS 15     Cardiac EnzymesNo results for input(s): TROPONINI in the last 168 hours. No  results for input(s): TROPIPOC in the last 168 hours.  Chemistry Recent Labs  Lab 11/25/19 0846  NA 135  K 3.3*  CL 95*  CO2 28   GLUCOSE 206*  BUN 22  CREATININE 1.33*  CALCIUM 8.9  GFRNONAA 38*  GFRAA 44*  ANIONGAP 12    Recent Labs  Lab 11/25/19 0846  PROT 7.7  ALBUMIN 3.4*  AST 20  ALT 15  ALKPHOS 77  BILITOT 2.3*   Hematology Recent Labs  Lab 11/25/19 0846  WBC 28.3*  RBC 5.55*  HGB 15.9*  HCT 48.4*  MCV 87.2  MCH 28.6  MCHC 32.9  RDW 14.6  PLT 286   BNPNo results for input(s): BNP, PROBNP in the last 168 hours.  DDimer No results for input(s): DDIMER in the last 168 hours.  Radiology/Studies:  CT Abdomen Pelvis W Contrast  Result Date: 11/25/2019 CLINICAL DATA:  Abdominal abscess or infection. EXAM: CT ABDOMEN AND PELVIS WITH CONTRAST TECHNIQUE: Multidetector CT imaging of the abdomen and pelvis was performed using the standard protocol following bolus administration of intravenous contrast. CONTRAST:  80 mL OMNIPAQUE IOHEXOL 300 MG/ML  SOLN COMPARISON:  June 15, 2013. FINDINGS: Lower chest: No acute abnormality. Hepatobiliary: There is a large gallstone noted in the neck of the gallbladder. The gallbladder is dilated with thickened wall and surrounding inflammatory change consistent with acute cholecystitis. No biliary dilatation is noted. No focal abnormality is noted in the liver. Pancreas: Unremarkable. No pancreatic ductal dilatation or surrounding inflammatory changes. Spleen: Normal in size without focal abnormality. Adrenals/Urinary Tract: Adrenal glands are unremarkable. Kidneys are normal, without renal calculi, focal lesion, or hydronephrosis. Bladder is unremarkable. Stomach/Bowel: Stomach is within normal limits. Appendix appears normal. No evidence of bowel wall thickening, distention, or inflammatory changes. Vascular/Lymphatic: Aortic atherosclerosis. No enlarged abdominal or pelvic lymph nodes. Reproductive: Status post hysterectomy. No adnexal masses. Other: No abdominal wall hernia or abnormality. No abdominopelvic ascites. Musculoskeletal: No acute or significant osseous  findings. IMPRESSION: Findings consistent with acute cholecystitis. Large gallstone is noted in the neck of the gallbladder. No biliary dilatation is noted. Aortic Atherosclerosis (ICD10-I70.0). Electronically Signed   By: Marijo Conception M.D.   On: 11/25/2019 10:14    Assessment and Plan:   Preoperative Risk Assessment -She presents with acute cholecystitis.  CT scan clarify this diagnosis.  Her white count is significantly elevated.  She endorses no chest pain.  She does report shortness of breath and fatigue for years, but this is stable.  There has been no acute change in her overall condition other than her acute cholecystitis.  Overall, other than inactivity she is relatively low risk based on the RCRI of 0 (no MI, HF, CKD, DM on insulin, CVA).  This results in a very low estimated risk (0.4%) of perioperative myocardial infarction, pulmonary edema, ventricular fibrillation, cardiac arrest, or complete heart block. -She is in ventricular bigeminy likely related to hypokalemia.  She has no active evidence of heart failure on examination.  She has no evidence of an acute coronary event (no acute EKG changes and negative troponin).  I would recommend to aggressively replace her potassium to get this above 4.0.  I have also put in for a check of her magnesium and we will go ahead and give her 2 g of magnesium (goal >2.0).  I have also discussed her care with the surgical team and recommended aggressive hydration.  They will start this. -I would recommend no further cardiac testing prior to surgery.  Her  procedure is rather urgent, if not an emergency, and I would not delay her surgical care and definitive treatment for further cardiac testing. -Our service is available as needed in the peri-operative period.    Ventricular bigeminy -I suspect this is related to hypokalemia.  I have recommended aggressive electrolyte replacement (K > 4.0 and Mg >2.0).  She also needs intravenous fluids.  I have also  ordered magnesium replacement as detailed above.  And for a magnesium check.  I suspect her arrhythmia will resolve with replacement of electrolytes.  Also in the setting of profound infection (WBC 28,000) and acute illness, PVCs are not that unexpected.  This does not represent an acute coronary syndrome and this should not delay definitive treatment of her acute cholecystitis.  For questions or updates, please contact [place] Please consult [email or web address] for contact info under   Signed, [place] T. Audie Box, [place]  11/25/2019 2:47 PM

## 2019-11-25 NOTE — H&P (Signed)
[facility] Surgery Consult Note  Kathryn Thomas 08/22/1941  [id number].    Requesting MD: Theotis Burrow Chief Complaint: Vomiting Reason for Consult: cholecystitis/cholelithiasis  HPI: Patient is a 79 year old female who presented to [place] with complaints of vomiting since Monday evening 11/22/2019.  She also reports receiving her Covid vaccine on Sunday, 11/21/2019. She reported abdominal pain, chills, sweats starting Sunday.  She has had ongoing abdominal pain, nausea and vomiting.  She was initially seen in Urgent care and referred to the [place] ED.  Work-up in the ED: Temperature 99.3 heart rates range between 39-47.  Blood pressure stable.  Labs show a potassium of 3.3, chloride of 95, glucose of 206, creatinine 1.33, alk phos is 12, lipase 18, AST 20, ALT 15, bilirubin 2.3.  Troponin XV lactate 1.6, WBC 28.3, hemoglobin 15.9, hematocrit 48.4, platelets 286,000.  Covid is negative.  UA shows many bacteria with 6-10 white cells per high-powered field.  Blood cultures x2 is pending.  EKG: Shows a tachycardia with ventricular bigeminy.  Question of ST elevation inferior changes.  CT scan with contrast shows a large gallstone in the neck of the gallbladder.  Gallbladder is dilated with thickened wall is rounded inflammatory changes consistent with acute cholecystitis.  No focal dilatation pancreas is unremarkable.  Findings consistent with acute cholecystitis and a large gallstone noted in the gallbladder.  Patient is being transferred to [facility] for evaluation.  We are ask to see.     ROS: Review of Systems  Constitutional: Positive for fever (100 - 101 at home).  HENT: Negative.   Eyes: Negative.   Respiratory: Positive for shortness of breath.        She says she can't really walk to mail box or vacuum at home because of SOB.  She does not shop and if she goes out it's no more than 30 minutes.     Cardiovascular: Positive for leg swelling.   Gastrointestinal: Positive for abdominal pain, heartburn, nausea and vomiting. Negative for blood in stool, constipation, diarrhea and melena.       Last BM 2-3 days ago  Genitourinary: Negative.   Musculoskeletal: Positive for back pain.       Hx of back surgery  Skin: Negative.   Neurological: Positive for headaches.  Endo/Heme/Allergies: Negative.   Psychiatric/Behavioral: Positive for depression.    Family History  Problem Relation Age of Onset  . Cervical cancer Mother   . Heart disease Mother        died of CHF  . Heart disease Father        died at 36 of heart attack  . Multiple myeloma Sister        spleen involvement  . Cancer Sister        bone marrow  . Stroke Sister   . Colon cancer Neg Hx     Past Medical History:  Diagnosis Date  . Acute renal failure ([place])   . Anal stenosis 2010  . Anxiety   . Blood transfusion    " no reaction to transfusion "  . Depression   . External hemorrhoids   . Gastric polyp   . Gastritis   . Hypertension   . Intestinal metaplasia of gastric mucosa 09/2011   high grade dysplasia  . Pernicious anemia   . Shortness of breath   . Vitamin B12 deficiency     Past Surgical History:  Procedure Laterality Date  . ABDOMINAL HYSTERECTOMY  40  years ago    . BACK SURGERY  1980   three lower spine surgeries.  . ESOPHAGOGASTRODUODENOSCOPY  10/01/2011   Procedure: ESOPHAGOGASTRODUODENOSCOPY (EGD);  Surgeon: Lafayette Dragon, MD;  Location[facility] ENDOSCOPY;  Service: Endoscopy;  Laterality: N/A;    Social History:  reports that she has never smoked. She has never used smokeless tobacco. She reports that she does not drink alcohol or use drugs.  Allergies: No Known Allergies  Tobacco:  None Etoh:  None Drugs:  None  Lives alone, daughter  2 miles away.   Prior to Admission medications   Medication Sig Start Date End Date Taking? Authorizing Provider  ALPRAZolam (XANAX) 0.25 MG tablet TAKE ONE TABLET BY MOUTH TWICE DAILY AS NEEDED FOR  ANXIETY 10/28/14   Robyn Haber, MD  buPROPion [facility] SR) 150 MG 12 hr tablet Take 1 tablet (150 mg total) by mouth 2 (two) times daily. 02/20/14   Robyn Haber, MD  cephALEXin (KEFLEX) 500 MG capsule Take 1 capsule (500 mg total) by mouth 2 (two) times daily. 11/28/16   Forde Dandy, MD  Cyanocobalamin (VITAMIN B-12 IJ) Inject 1 mL as directed every 30 (thirty) days.    [provider]  fluticasone (FLONASE) 50 MCG/ACT nasal spray Place 2 sprays into both nostrils daily. 12/28/13   Orma Flaming, MD  hydrochlorothiazide (HYDRODIURIL) 12.5 MG tablet 12.5 mg daily. Take daily for swelling 05/05/13   Copland, Gay Filler, MD  hydrochlorothiazide (MICROZIDE) 12.5 MG capsule Take 1 capsule (12.5 mg total) by mouth daily. 07/07/14   Harrison Mons, PA  ibuprofen (ADVIL,MOTRIN) 600 MG tablet Take 1 tablet (600 mg total) by mouth every 8 (eight) hours as needed. NO MORE REFILLS WITHOUT OFFICE VISIT - 2ND NOTICE 08/12/14   Posey Boyer, MD  lisinopril (PRINIVIL,ZESTRIL) 10 MG tablet TAKE ONE TABLET BY MOUTH ONCE DAILY 07/06/14   Robyn Haber, MD  traMADol (ULTRAM) 50 MG tablet Take 1 tablet (50 mg total) by mouth every 12 (twelve) hours as needed. 02/12/18   Hedges, Dellis Filbert, PA-C  Vitamin D, Ergocalciferol, (DRISDOL) 50000 UNITS CAPS capsule Take 50,000 Units by mouth every 7 (seven) days. On Wednesday.    [provider]     Blood pressure (!) 120/53, pulse (!) 47, temperature 99.3 F (37.4 C), temperature source Oral, resp. rate 18, height '5\' 7"'  (1.702 m), weight 110.7 kg, SpO2 95 %.  I took her temp and it's 100.3 oral currently Physical Exam:  General: elderly WF in bed, minimal discomfort lying still, pain with movement or palpation.   HEENT: head is normocephalic, atraumatic.  Sclera are noninjected.  Pupils are equal.  .  Mouth is pink and moist Heart:  HR is irregular, with some bigeminy/trigemeny.  Complaints of DOE, ? Mumur;   Palpable radial and pedal pulses  bilaterally Lungs: CTAB, no wheezes, rhonchi, or rales noted.  Respiratory effort nonlabored  Hurts to sit up Abd: distended, tender to palpation, movement, with some rebound. MS: all 4 extremities are symmetrical with no cyanosis, clubbing, or edema. Skin: warm and dry with no masses, lesions, or rashes Neuro: Cranial nerves 2-12 grossly intact, sensation is normal throughout Psych: A&Ox3 with an appropriate affect.   Results for orders placed or performed during the hospital encounter of 11/25/19 (from the past 48 hour(s))  Comprehensive metabolic panel     Status: Abnormal   Collection Time: 11/25/19  8:46 AM  Result Value Ref Range   Sodium 135 135 - 145 mmol/L   Potassium 3.3 (  L) 3.5 - 5.1 mmol/L   Chloride 95 (L) 98 - 111 mmol/L   CO2 28 22 - 32 mmol/L   Glucose, Bld 206 (H) 70 - 99 mg/dL    Comment: Glucose reference range applies only to samples taken after fasting for at least 8 hours.   BUN 22 8 - 23 mg/dL   Creatinine, Ser 1.33 (H) 0.44 - 1.00 mg/dL   Calcium 8.9 8.9 - 10.3 mg/dL   Total Protein 7.7 6.5 - 8.1 g/dL   Albumin 3.4 (L) 3.5 - 5.0 g/dL   AST 20 15 - 41 U/L   ALT 15 0 - 44 U/L   Alkaline Phosphatase 77 38 - 126 U/L   Total Bilirubin 2.3 (H) 0.3 - 1.2 mg/dL   GFR calc non Af Amer 38 (L) >60 mL/min   GFR calc Af Amer 44 (L) >60 mL/min   Anion gap 12 5 - 15    Comment: Performed at [facility], [address] [place], [place] [address]  Lipase, blood     Status: None   Collection Time: 11/25/19  8:46 AM  Result Value Ref Range   Lipase 18 11 - 51 U/L    Comment: Performed at [facility], [address] [place], [place] [address]  CBC with Differential     Status: Abnormal   Collection Time: 11/25/19  8:46 AM  Result Value Ref Range   WBC 28.3 (H) 4.0 - 10.5 K/uL   RBC 5.55 (H) 3.87 - 5.11 MIL/uL   Hemoglobin 15.9 (H) 12.0 - 15.0 g/dL   HCT 48.4 (H) 36.0 - 46.0 %   MCV 87.2 80.0 - 100.0 fL   MCH 28.6 26.0 - 34.0 pg   MCHC 32.9  30.0 - 36.0 g/dL   RDW 14.6 11.5 - 15.5 %   Platelets 286 150 - 400 K/uL   nRBC 0.0 0.0 - 0.2 %   Neutrophils Relative % 84 %   Neutro Abs 23.8 (H) 1.7 - 7.7 K/uL   Lymphocytes Relative 8 %   Lymphs Abs 2.2 0.7 - 4.0 K/uL   Monocytes Relative 6 %   Monocytes Absolute 1.8 (H) 0.1 - 1.0 K/uL   Eosinophils Relative 0 %   Eosinophils Absolute 0.1 0.0 - 0.5 K/uL   Basophils Relative 0 %   Basophils Absolute 0.1 0.0 - 0.1 K/uL   Immature Granulocytes 2 %   Abs Immature Granulocytes 0.47 (H) 0.00 - 0.07 K/uL   Stomatocytes PRESENT    Giant PLTs PRESENT     Comment: Performed at [facility], [address] [place], [place] [address]  Lactic acid, plasma     Status: None   Collection Time: 11/25/19  8:46 AM  Result Value Ref Range   Lactic Acid, Venous 1.6 0.5 - 1.9 mmol/L    Comment: Performed at [facility], [address] [place], [place] [address]  Troponin I (High Sensitivity)     Status: None   Collection Time: 11/25/19  8:46 AM  Result Value Ref Range   Troponin I (High Sensitivity) 15 <18 ng/L    Comment: (NOTE) Elevated high sensitivity troponin I (hsTnI) values and significant  changes across serial measurements may suggest ACS but many other  chronic and acute conditions are known to elevate hsTnI results.  Refer to the "Links" section for chest pain algorithms and additional  guidance. Performed at [facility], [address] [place], [place] [address]   Urinalysis, Routine w reflex microscopic     Status: Abnormal   Collection Time: 11/25/19 10:11 AM  Result Value Ref Range   Color, Urine YELLOW YELLOW   APPearance HAZY (A) CLEAR   Specific Gravity, Urine <1.005 (L) 1.005 - 1.030   pH 6.5 5.0 - 8.0   Glucose, UA NEGATIVE NEGATIVE mg/dL   Hgb urine dipstick SMALL (A) NEGATIVE   Bilirubin Urine SMALL (A) NEGATIVE   Ketones, ur NEGATIVE NEGATIVE mg/dL   Protein, ur 30 (A) NEGATIVE mg/dL   Nitrite NEGATIVE NEGATIVE    Leukocytes,Ua NEGATIVE NEGATIVE    Comment: Performed at [facility], [address] [place], [place] [address]  Urinalysis, Microscopic (reflex)     Status: Abnormal   Collection Time: 11/25/19 10:11 AM  Result Value Ref Range   RBC / HPF 0-5 0 - 5 RBC/hpf   WBC, UA 6-10 0 - 5 WBC/hpf   Bacteria, UA MANY (A) NONE SEEN   Squamous Epithelial / LPF 0-5 0 - 5   Granular Casts, UA PRESENT     Comment: Performed at [facility], [address] [place], [place] [address]  Respiratory Panel by RT PCR (Flu A&B, Covid) - Nasopharyngeal Swab     Status: None   Collection Time: 11/25/19 10:59 AM   Specimen: Nasopharyngeal Swab  Result Value Ref Range   SARS Coronavirus 2 by RT PCR NEGATIVE NEGATIVE    Comment: (NOTE) SARS-CoV-2 target nucleic acids are NOT DETECTED. The SARS-CoV-2 RNA is generally detectable in upper respiratoy specimens during the acute phase of infection. The lowest concentration of SARS-CoV-2 viral copies this assay can detect is 131 copies/mL. A negative result does not preclude SARS-Cov-2 infection and should not be used as the sole basis for treatment or other patient management decisions. A negative result may occur with  improper specimen collection/handling, submission of specimen other than nasopharyngeal swab, presence of viral mutation(s) within the areas targeted by this assay, and inadequate number of viral copies (<131 copies/mL). A negative result must be combined with clinical observations, patient history, and epidemiological information. The expected result is Negative. Fact Sheet for Patients:  PinkCheek.be Fact Sheet for Healthcare Providers:  GravelBags.it This test is not yet ap proved or cleared by the [place] FDA and  has been authorized for detection and/or diagnosis of SARS-CoV-2 by FDA under an Emergency Use Authorization (EUA). This EUA will remain  in effect  (meaning this test can be used) for the duration of the COVID-19 declaration under Section 564(b)(1) of the Act, 21 U.S.C. section 360bbb-3(b)(1), unless the authorization is terminated or revoked sooner.    Influenza A by PCR NEGATIVE NEGATIVE   Influenza B by PCR NEGATIVE NEGATIVE    Comment: (NOTE) The Xpert Xpress SARS-CoV-2/FLU/RSV assay is intended as an aid in  the diagnosis of influenza from Nasopharyngeal swab specimens and  should not be used as a sole basis for treatment. Nasal washings and  aspirates are unacceptable for Xpert Xpress SARS-CoV-2/FLU/RSV  testing. Fact Sheet for Patients: PinkCheek.be Fact Sheet for Healthcare Providers: GravelBags.it This test is not yet approved or cleared by the [place] FDA and  has been authorized for detection and/or diagnosis of SARS-CoV-2 by  FDA under an Emergency Use Authorization (EUA). This EUA will remain  in effect (meaning this test can be used) for the duration of the  Covid-19 declaration under Section 564(b)(1) of the Act, 21  U.S.C. section 360bbb-3(b)(1), unless the authorization is  terminated or revoked. Performed at [facility], [address] [place], [place] [address]    CT Abdomen Pelvis W Contrast  Result Date: 11/25/2019 CLINICAL DATA:  Abdominal abscess or infection. EXAM: CT ABDOMEN AND PELVIS WITH CONTRAST TECHNIQUE: Multidetector CT imaging of the abdomen and pelvis was performed using the standard protocol following bolus administration of intravenous contrast. CONTRAST:  80 mL OMNIPAQUE IOHEXOL 300 MG/ML  SOLN COMPARISON:  June 15, 2013. FINDINGS: Lower chest: No acute abnormality. Hepatobiliary: There is a large gallstone noted in the neck of the gallbladder. The gallbladder is dilated with thickened wall and surrounding inflammatory change consistent with acute cholecystitis. No biliary dilatation is noted. No focal abnormality is  noted in the liver. Pancreas: Unremarkable. No pancreatic ductal dilatation or surrounding inflammatory changes. Spleen: Normal in size without focal abnormality. Adrenals/Urinary Tract: Adrenal glands are unremarkable. Kidneys are normal, without renal calculi, focal lesion, or hydronephrosis. Bladder is unremarkable. Stomach/Bowel: Stomach is within normal limits. Appendix appears normal. No evidence of bowel wall thickening, distention, or inflammatory changes. Vascular/Lymphatic: Aortic atherosclerosis. No enlarged abdominal or pelvic lymph nodes. Reproductive: Status post hysterectomy. No adnexal masses. Other: No abdominal wall hernia or abnormality. No abdominopelvic ascites. Musculoskeletal: No acute or significant osseous findings. IMPRESSION: Findings consistent with acute cholecystitis. Large gallstone is noted in the neck of the gallbladder. No biliary dilatation is noted. Aortic Atherosclerosis (ICD10-I70.0). Electronically Signed   By: Marijo Conception M.D.   On: 11/25/2019 10:14      Assessment/Plan Abnormal EKG/DOE Hypertension Hx of B12 anemia Hx gastric polyps Hypertension Anxiety  Chronic back pain BMI 38   Acute cholecystitis/cholelithiasis  FEN:  NPO/IV fluids ID: Zosyn 3/4 >> DVT: Lovenox Follow up:  TBD   Plan:  We will admit and aim to do cholecystectomy as soon as she is cleared by cardiology.  Perfusion test in 2014 reported normal.  In the interim we will hydrate with IV fluids, start on antibiotics, recheck labs in the AM.  She has 2 EKG's from today.      Earnstine Regal [facility] Surgery 11/25/2019, 12:41 PM Please see Amion for pager number during day hours 7:00am-4:30pm

## 2019-11-26 ENCOUNTER — Inpatient Hospital Stay (HOSPITAL_COMMUNITY): Payer: Medicare HMO

## 2019-11-26 ENCOUNTER — Inpatient Hospital Stay (HOSPITAL_COMMUNITY): Payer: Medicare HMO | Admitting: Certified Registered Nurse Anesthetist

## 2019-11-26 ENCOUNTER — Encounter (HOSPITAL_COMMUNITY): Payer: Self-pay

## 2019-11-26 ENCOUNTER — Encounter (HOSPITAL_COMMUNITY): Admission: EM | Disposition: A | Discharge status: Home / Self Care | Payer: Self-pay | Source: Home / Self Care

## 2019-11-26 DIAGNOSIS — K831 Obstruction of bile duct: Secondary | ICD-10-CM

## 2019-11-26 DIAGNOSIS — R17 Unspecified jaundice: Secondary | ICD-10-CM

## 2019-11-26 DIAGNOSIS — D510 Vitamin B12 deficiency anemia due to intrinsic factor deficiency: Secondary | ICD-10-CM | POA: Diagnosis not present

## 2019-11-26 DIAGNOSIS — K810 Acute cholecystitis: Secondary | ICD-10-CM

## 2019-11-26 DIAGNOSIS — I10 Essential (primary) hypertension: Secondary | ICD-10-CM | POA: Diagnosis not present

## 2019-11-26 DIAGNOSIS — R69 Illness, unspecified: Secondary | ICD-10-CM | POA: Diagnosis not present

## 2019-11-26 HISTORY — PX: CHOLECYSTECTOMY: SHX55

## 2019-11-26 LAB — CBC
HCT: 46.9 % — ABNORMAL HIGH (ref 36.0–46.0)
Hemoglobin: 15 g/dL (ref 12.0–15.0)
MCH: 28.6 pg (ref 26.0–34.0)
MCHC: 32 g/dL (ref 30.0–36.0)
MCV: 89.5 fL (ref 80.0–100.0)
Platelets: 182 10*3/uL (ref 150–400)
RBC: 5.24 MIL/uL — ABNORMAL HIGH (ref 3.87–5.11)
RDW: 14.6 % (ref 11.5–15.5)
WBC: 19.3 10*3/uL — ABNORMAL HIGH (ref 4.0–10.5)
nRBC: 0 % (ref 0.0–0.2)

## 2019-11-26 LAB — COMPREHENSIVE METABOLIC PANEL
ALT: 16 U/L (ref 0–44)
AST: 21 U/L (ref 15–41)
Albumin: 2.9 g/dL — ABNORMAL LOW (ref 3.5–5.0)
Alkaline Phosphatase: 86 U/L (ref 38–126)
Anion gap: 12 (ref 5–15)
BUN: 23 mg/dL (ref 8–23)
CO2: 24 mmol/L (ref 22–32)
Calcium: 8.5 mg/dL — ABNORMAL LOW (ref 8.9–10.3)
Chloride: 98 mmol/L (ref 98–111)
Creatinine, Ser: 1.29 mg/dL — ABNORMAL HIGH (ref 0.44–1.00)
GFR calc Af Amer: 46 mL/min — ABNORMAL LOW (ref >60)
GFR calc non Af Amer: 39 mL/min — ABNORMAL LOW (ref >60)
Glucose, Bld: 129 mg/dL — ABNORMAL HIGH (ref 70–99)
Potassium: 3.7 mmol/L (ref 3.5–5.1)
Sodium: 134 mmol/L — ABNORMAL LOW (ref 135–145)
Total Bilirubin: 3.4 mg/dL — ABNORMAL HIGH (ref 0.3–1.2)
Total Protein: 7 g/dL (ref 6.5–8.1)

## 2019-11-26 LAB — MAGNESIUM: Magnesium: 2.2 mg/dL (ref 1.7–2.4)

## 2019-11-26 LAB — SURGICAL PCR SCREEN
MRSA, PCR: NEGATIVE
Staphylococcus aureus: POSITIVE — AB

## 2019-11-26 SURGERY — LAPAROSCOPIC CHOLECYSTECTOMY WITH INTRAOPERATIVE CHOLANGIOGRAM
Anesthesia: General | Site: Abdomen

## 2019-11-26 MED ORDER — LIDOCAINE 2% (20 MG/ML) 5 ML SYRINGE
INTRAMUSCULAR | Status: DC | PRN
  Administered 2019-11-26: 100 mg via INTRAVENOUS

## 2019-11-26 MED ORDER — DEXAMETHASONE SODIUM PHOSPHATE 4 MG/ML IJ SOLN
INTRAMUSCULAR | Status: DC | PRN
  Administered 2019-11-26: 4 mg via INTRAVENOUS

## 2019-11-26 MED ORDER — LACTATED RINGERS IR SOLN
Status: DC | PRN
  Administered 2019-11-26: 1000 mL

## 2019-11-26 MED ORDER — SUGAMMADEX SODIUM 200 MG/2ML IV SOLN
INTRAVENOUS | Status: DC | PRN
  Administered 2019-11-26: 200 mg via INTRAVENOUS

## 2019-11-26 MED ORDER — SUCCINYLCHOLINE CHLORIDE 200 MG/10ML IV SOSY
PREFILLED_SYRINGE | INTRAVENOUS | Status: DC | PRN
  Administered 2019-11-26: 100 mg via INTRAVENOUS

## 2019-11-26 MED ORDER — PHENYLEPHRINE 40 MCG/ML (10ML) SYRINGE FOR IV PUSH (FOR BLOOD PRESSURE SUPPORT)
PREFILLED_SYRINGE | INTRAVENOUS | Status: DC | PRN
  Administered 2019-11-26: 80 ug via INTRAVENOUS

## 2019-11-26 MED ORDER — ROCURONIUM BROMIDE 10 MG/ML (PF) SYRINGE
PREFILLED_SYRINGE | INTRAVENOUS | Status: AC
  Filled 2019-11-26: qty 10

## 2019-11-26 MED ORDER — ONDANSETRON HCL 4 MG/2ML IJ SOLN: 4.0000 mg | Freq: Four times a day (QID) | INTRAMUSCULAR | Status: DC | PRN

## 2019-11-26 MED ORDER — FENTANYL CITRATE (PF) 100 MCG/2ML IJ SOLN
INTRAMUSCULAR | Status: AC
  Filled 2019-11-26: qty 2

## 2019-11-26 MED ORDER — LIDOCAINE 2% (20 MG/ML) 5 ML SYRINGE
INTRAMUSCULAR | Status: AC
  Filled 2019-11-26: qty 5

## 2019-11-26 MED ORDER — LACTATED RINGERS IV SOLN: INTRAVENOUS | Status: DC

## 2019-11-26 MED ORDER — BUPIVACAINE HCL (PF) 0.5 % IJ SOLN
INTRAMUSCULAR | Status: AC
  Filled 2019-11-26: qty 30

## 2019-11-26 MED ORDER — PHENYLEPHRINE 40 MCG/ML (10ML) SYRINGE FOR IV PUSH (FOR BLOOD PRESSURE SUPPORT)
PREFILLED_SYRINGE | INTRAVENOUS | Status: AC
  Filled 2019-11-26: qty 10

## 2019-11-26 MED ORDER — ROCURONIUM BROMIDE 10 MG/ML (PF) SYRINGE
PREFILLED_SYRINGE | INTRAVENOUS | Status: DC | PRN
  Administered 2019-11-26: 50 mg via INTRAVENOUS

## 2019-11-26 MED ORDER — ONDANSETRON HCL 4 MG/2ML IJ SOLN
INTRAMUSCULAR | Status: AC
  Filled 2019-11-26: qty 2

## 2019-11-26 MED ORDER — HYDROMORPHONE HCL 1 MG/ML IJ SOLN
INTRAMUSCULAR | Status: AC
  Filled 2019-11-26: qty 1

## 2019-11-26 MED ORDER — SUCCINYLCHOLINE CHLORIDE 200 MG/10ML IV SOSY
PREFILLED_SYRINGE | INTRAVENOUS | Status: AC
  Filled 2019-11-26: qty 10

## 2019-11-26 MED ORDER — HYDROMORPHONE HCL 1 MG/ML IJ SOLN
0.2500 mg | INTRAMUSCULAR | Status: DC | PRN
  Administered 2019-11-26 (×2): 0.5 mg via INTRAVENOUS

## 2019-11-26 MED ORDER — ACETAMINOPHEN 650 MG RE SUPP: 650.0000 mg | Freq: Four times a day (QID) | RECTAL | Status: DC | PRN

## 2019-11-26 MED ORDER — ONDANSETRON 4 MG PO TBDP: 4.0000 mg | ORAL_TABLET | Freq: Four times a day (QID) | ORAL | Status: DC | PRN

## 2019-11-26 MED ORDER — KCL IN DEXTROSE-NACL 20-5-0.45 MEQ/L-%-% IV SOLN
INTRAVENOUS | Status: DC
  Filled 2019-11-26 (×3): qty 1000

## 2019-11-26 MED ORDER — POTASSIUM CHLORIDE CRYS ER 20 MEQ PO TBCR
40.0000 meq | EXTENDED_RELEASE_TABLET | Freq: Once | ORAL | Status: AC
  Administered 2019-11-26: 40 meq via ORAL
  Filled 2019-11-26: qty 2

## 2019-11-26 MED ORDER — IOHEXOL 300 MG/ML  SOLN
INTRAMUSCULAR | Status: DC | PRN
  Administered 2019-11-26: 38 mL

## 2019-11-26 MED ORDER — OXYCODONE HCL 5 MG PO TABS
5.0000 mg | ORAL_TABLET | ORAL | Status: DC | PRN
  Administered 2019-11-26: 5 mg via ORAL
  Administered 2019-11-26 – 2019-11-29 (×8): 10 mg via ORAL
  Filled 2019-11-26 (×8): qty 2
  Filled 2019-11-26: qty 1

## 2019-11-26 MED ORDER — HYDROMORPHONE HCL 1 MG/ML IJ SOLN
1.0000 mg | INTRAMUSCULAR | Status: DC | PRN
  Administered 2019-11-27 – 2019-11-30 (×4): 1 mg via INTRAVENOUS
  Filled 2019-11-26 (×4): qty 1

## 2019-11-26 MED ORDER — TRAMADOL HCL 50 MG PO TABS: 50.0000 mg | ORAL_TABLET | Freq: Four times a day (QID) | ORAL | Status: DC | PRN

## 2019-11-26 MED ORDER — PIPERACILLIN-TAZOBACTAM 3.375 G IVPB
3.3750 g | Freq: Three times a day (TID) | INTRAVENOUS | Status: DC
  Administered 2019-11-26 – 2019-11-29 (×7): 3.375 g via INTRAVENOUS
  Filled 2019-11-26 (×9): qty 50

## 2019-11-26 MED ORDER — ENOXAPARIN SODIUM 40 MG/0.4ML ~~LOC~~ SOLN: 40.0000 mg | SUBCUTANEOUS | Status: DC

## 2019-11-26 MED ORDER — PROPOFOL 10 MG/ML IV BOLUS
INTRAVENOUS | Status: DC | PRN
  Administered 2019-11-26: 120 mg via INTRAVENOUS

## 2019-11-26 MED ORDER — 0.9 % SODIUM CHLORIDE (POUR BTL) OPTIME
TOPICAL | Status: DC | PRN
  Administered 2019-11-26: 1000 mL

## 2019-11-26 MED ORDER — POTASSIUM CHLORIDE CRYS ER 20 MEQ PO TBCR: 40.0000 meq | EXTENDED_RELEASE_TABLET | ORAL | Status: DC

## 2019-11-26 MED ORDER — BUPIVACAINE HCL (PF) 0.5 % IJ SOLN
INTRAMUSCULAR | Status: DC | PRN
  Administered 2019-11-26: 30 mL

## 2019-11-26 MED ORDER — ACETAMINOPHEN 325 MG PO TABS
650.0000 mg | ORAL_TABLET | Freq: Four times a day (QID) | ORAL | Status: DC | PRN
  Administered 2019-12-01: 650 mg via ORAL
  Filled 2019-11-26: qty 2

## 2019-11-26 MED ORDER — FENTANYL CITRATE (PF) 100 MCG/2ML IJ SOLN
INTRAMUSCULAR | Status: DC | PRN
  Administered 2019-11-26 (×4): 50 ug via INTRAVENOUS

## 2019-11-26 MED ORDER — DEXAMETHASONE SODIUM PHOSPHATE 10 MG/ML IJ SOLN
INTRAMUSCULAR | Status: AC
  Filled 2019-11-26: qty 1

## 2019-11-26 MED ORDER — TRAMADOL HCL 50 MG PO TABS
50.0000 mg | ORAL_TABLET | Freq: Four times a day (QID) | ORAL | Status: DC | PRN
  Administered 2019-11-30 – 2019-12-01 (×2): 50 mg via ORAL
  Filled 2019-11-26 (×2): qty 1

## 2019-11-26 SURGICAL SUPPLY — 36 items
ADH SKN CLS APL DERMABOND .7 (GAUZE/BANDAGES/DRESSINGS) ×1
APPLIER CLIP ROT 10 11.4 M/L (STAPLE)
BAG SPEC RTRVL LRG 6X4 10 (ENDOMECHANICALS) ×1
CABLE HIGH FREQUENCY MONO STRZ (ELECTRODE) ×2 IMPLANT
CHLORAPREP W/TINT 26 (MISCELLANEOUS) ×4 IMPLANT
CLIP APPLIE ROT 10 11.4 M/L (STAPLE) IMPLANT
COVER MAYO STAND STRL (DRAPES) ×2 IMPLANT
COVER SURGICAL LIGHT HANDLE (MISCELLANEOUS) ×2 IMPLANT
COVER WAND RF STERILE (DRAPES) IMPLANT
DECANTER SPIKE VIAL GLASS SM (MISCELLANEOUS) ×2 IMPLANT
DERMABOND ADVANCED (GAUZE/BANDAGES/DRESSINGS) ×1
DERMABOND ADVANCED .7 DNX12 (GAUZE/BANDAGES/DRESSINGS) ×1 IMPLANT
DRAPE C-ARM 42X120 X-RAY (DRAPES) ×2 IMPLANT
ELECT REM PT RETURN 15FT ADLT (MISCELLANEOUS) ×2 IMPLANT
GAUZE SPONGE 2X2 8PLY STRL LF (GAUZE/BANDAGES/DRESSINGS) ×1 IMPLANT
GLOVE SURG ORTHO 8.0 STRL STRW (GLOVE) ×2 IMPLANT
GOWN STRL REUS W/TWL XL LVL3 (GOWN DISPOSABLE) ×4 IMPLANT
HEMOSTAT SURGICEL 4X8 (HEMOSTASIS) IMPLANT
KIT BASIN OR (CUSTOM PROCEDURE TRAY) ×2 IMPLANT
KIT TURNOVER KIT A (KITS) IMPLANT
PENCIL SMOKE EVACUATOR (MISCELLANEOUS) IMPLANT
POUCH SPECIMEN RETRIEVAL 10MM (ENDOMECHANICALS) ×2 IMPLANT
SCISSORS LAP 5X35 DISP (ENDOMECHANICALS) ×2 IMPLANT
SET CHOLANGIOGRAPH MIX (MISCELLANEOUS) ×2 IMPLANT
SET IRRIG TUBING LAPAROSCOPIC (IRRIGATION / IRRIGATOR) ×2 IMPLANT
SET TUBE SMOKE EVAC HIGH FLOW (TUBING) IMPLANT
SLEEVE XCEL OPT CAN 5 100 (ENDOMECHANICALS) ×2 IMPLANT
SPONGE GAUZE 2X2 STER 10/PKG (GAUZE/BANDAGES/DRESSINGS) ×1
STRIP CLOSURE SKIN 1/2X4 (GAUZE/BANDAGES/DRESSINGS) IMPLANT
SUT MNCRL AB 4-0 PS2 18 (SUTURE) ×2 IMPLANT
TOWEL OR 17X26 10 PK STRL BLUE (TOWEL DISPOSABLE) ×2 IMPLANT
TOWEL OR NON WOVEN STRL DISP B (DISPOSABLE) ×2 IMPLANT
TRAY LAPAROSCOPIC (CUSTOM PROCEDURE TRAY) ×2 IMPLANT
TROCAR BLADELESS OPT 5 100 (ENDOMECHANICALS) ×2 IMPLANT
TROCAR XCEL BLUNT TIP 100MML (ENDOMECHANICALS) ×2 IMPLANT
TROCAR XCEL NON-BLD 11X100MML (ENDOMECHANICALS) ×2 IMPLANT

## 2019-11-26 NOTE — Anesthesia Postprocedure Evaluation (Signed)
Anesthesia Post Note  Patient: Kathryn Thomas  Procedure(s) Performed: LAPAROSCOPIC CHOLECYSTECTOMY WITH INTRAOPERATIVE CHOLANGIOGRAM (N/A Abdomen)     Patient location during evaluation: PACU Anesthesia Type: General Level of consciousness: awake and alert Pain management: pain level controlled Vital Signs Assessment: post-procedure vital signs reviewed and stable Respiratory status: spontaneous breathing, nonlabored ventilation, respiratory function stable and patient connected to nasal cannula oxygen Cardiovascular status: blood pressure returned to baseline and stable Postop Assessment: no apparent nausea or vomiting Anesthetic complications: no    Last Vitals:  Vitals:   11/26/19 1100 11/26/19 1115  BP: 137/67 (!) 121/50  Pulse: 81 82  Resp: 16 16  Temp:  37.5 C  SpO2: 96% 96%    Last Pain:  Vitals:   11/26/19 1115  TempSrc:   PainSc: Asleep                 Kearstyn Avitia,W. EDMOND

## 2019-11-26 NOTE — Progress Notes (Signed)
Mrs. Renslow was not seen on rounds this morning as she was in the OR. The consult team will follow-up on her tomorrow.   [place] T. Audie Box, [place]  [address] [place], [place] [address] [phone]  10:28 AM

## 2019-11-26 NOTE — Interval H&P Note (Signed)
History and Physical Interval Note:  11/26/2019 8:38 AM  Kathryn Thomas  has presented today for surgery, with the diagnosis of acute cholecystitis.  The various methods of treatment have been discussed with the patient and family. After consideration of risks, benefits and other options for treatment, the patient has consented to    Procedure(s): LAPAROSCOPIC CHOLECYSTECTOMY WITH INTRAOPERATIVE CHOLANGIOGRAM (N/A) as a surgical intervention.    The patient's history has been reviewed, patient examined, no change in status, stable for surgery.  I have reviewed the patient's chart and labs.  Questions were answered to the patient's satisfaction.    Armandina Gemma, MD [facility] Office: [place]

## 2019-11-26 NOTE — Op Note (Signed)
Procedure Note  Pre-operative Diagnosis:  Acute cholecystitis, cholelithiasis  Post-operative Diagnosis:  same  Surgeon:  Armandina Gemma, MD  Assistant:  none   Procedure:  Laparoscopic cholecystectomy with intra-operative cholangiography  Anesthesia:  General  Estimated Blood Loss:  minimal  Drains: none         Specimen: gallbladder to pathology  Indications:  Patient is a 79 yo female admitted with acute cholecystitis and cholelithiasis.  After evaluation by cardiology, the patient is prepared for surgery for cholecystectomy.  Procedure Details:  The patient was seen in the pre-op holding area. The risks, benefits, complications, treatment options, and expected outcomes were previously discussed with the patient. The patient agreed with the proposed plan and has signed the informed consent form.  The patient was transported to operating room # 1 at the [facility]. The patient was placed in the supine position on the operating room table. Following induction of general anesthesia, the abdomen was prepped and draped in the usual aseptic fashion.  An incision was made in the skin near the umbilicus. The midline fascia was incised and the peritoneal cavity was entered and a Hasson cannula was introduced under direct vision. The cannula was secured with a 0-Vicryl pursestring suture. Pneumoperitoneum was established with carbon dioxide. Additional cannulae were introduced under direct vision along the right costal margin in the midline, mid-clavicular line, and anterior axillary line.   The gallbladder was identified after mobilization of an adherent omentum.  The gallbladder was markedly distended, thickened, and erythematous. The gallbladder was aspirated and then the fundus grasped and retracted cephalad. Adhesions were taken down bluntly and the electrocautery was utilized as needed, taking care not to involve any adjacent structures. The infundibulum was grasped and retracted  laterally, exposing the peritoneum overlying the triangle of Calot. The peritoneum was incised and structures exposed with blunt dissection. The cystic duct was clearly identified, bluntly dissected circumferentially, and clipped at the neck of the gallbladder.  An incision was made in the cystic duct and the cholangiogram catheter introduced. The catheter was secured using an ligaclip.  Real-time cholangiography was performed using C-arm fluoroscopy.  There was rapid filling of a normal caliber common bile duct.  There was reflux of contrast into the left and right hepatic ductal systems.  There was free flow distally into the duodenum without filling defect or obstruction.  The catheter was removed from the peritoneal cavity.  The cystic duct was then ligated with ligaclips and divided. The cystic artery was identified, dissected circumferentially, ligated with ligaclips, and divided.  The gallbladder was dissected away from the gallbladder bed using the electrocautery for hemostasis. The gallbladder was completely removed from the liver and placed into an endocatch bag. The gallbladder was removed in the endocatch bag through the umbilical port site and submitted to pathology for review.  The right upper quadrant was irrigated and the gallbladder bed was inspected. Hemostasis was achieved with the electrocautery.  Cannulae were removed under direct vision and good hemostasis was noted. Pneumoperitoneum was released and the majority of the carbon dioxide evacuated. The umbilical wound was irrigated and the fascia was then closed with the pursestring suture.  Local anesthetic was infiltrated at all port sites. Skin incisions were closed with 4-0 Monocril subcuticular sutures and Dermabond was applied.  Instrument, sponge, and needle counts were correct at the conclusion of the case.  The patient was awakened from anesthesia and brought to the recovery room in stable condition.  The patient tolerated the  procedure  well.   Armandina Gemma, MD [facility] Office: [phone]

## 2019-11-26 NOTE — Transfer of Care (Signed)
Immediate Anesthesia Transfer of Care Note  Patient: Kathryn Thomas  Procedure(s) Performed: LAPAROSCOPIC CHOLECYSTECTOMY WITH INTRAOPERATIVE CHOLANGIOGRAM (N/A Abdomen)  Patient Location: PACU  Anesthesia Type:General  Level of Consciousness: awake, drowsy and patient cooperative  Airway & Oxygen Therapy: Patient Spontanous Breathing and Patient connected to face mask  Post-op Assessment: Report given to RN and Post -op Vital signs reviewed and stable  Post vital signs: Reviewed and stable  Last Vitals:  Vitals Value Taken Time  BP 143/56 11/26/19 1024  Temp    Pulse 51 11/26/19 1024  Resp 25 11/26/19 1026  SpO2 95 % 11/26/19 1024  Vitals shown include unvalidated device data.  Last Pain:  Vitals:   11/26/19 0824  TempSrc:   PainSc: 8       Patients Stated Pain Goal: 7 ([id number])  Complications: No apparent anesthesia complications

## 2019-11-26 NOTE — Consult Note (Addendum)
Referring Provider:  Dr. Harlow Asa, CCS Primary Care Physician:  Benito Mccreedy, MD Primary Gastroenterologist:  Dr. Olevia Perches previously  Reason for Consultation:  Positive IOC  HPI: Kathryn Thomas is a 79 y.o. female who underwent lap chole with IOC by Dr. Harlow Asa earlier this morning.  IOC positive/worrisome for near occlusive choledocholithiasis, ? Stone vs sludge vs pus vs clot.  CT scan of the abdomen and pelvis yesterday showed the following:  IMPRESSION: Findings consistent with acute cholecystitis. Large gallstone is noted in the neck of the gallbladder. No biliary dilatation is Noted.  LFT's normal except total bili 3.4 today and was 2.8 yesterday.  Has leukocytosis, which has trended down on Zosyn.  Daughter was present at bedside as patient was still somewhat sleepy from her surgery.  Complaining of some nausea.  She has history of anal stenosis that has been dilated in the past.  She says that she has noticed that it has become narrowed again as she has very thin stools and sometimes has a hard time passing her stools.  Past Medical History:  Diagnosis Date  . Acute renal failure ([place])   . Anal stenosis 2010  . Anxiety   . Blood transfusion    " no reaction to transfusion "  . Depression   . External hemorrhoids   . Gastric polyp   . Gastritis   . Hypertension   . Intestinal metaplasia of gastric mucosa 09/2011   high grade dysplasia  . Pernicious anemia   . Shortness of breath   . Vitamin B12 deficiency     Past Surgical History:  Procedure Laterality Date  . ABDOMINAL HYSTERECTOMY    . BACK SURGERY  1980   three lower spine surgeries.  . ESOPHAGOGASTRODUODENOSCOPY  10/01/2011   Procedure: ESOPHAGOGASTRODUODENOSCOPY (EGD);  Surgeon: Lafayette Dragon, MD;  Location: [facility] ENDOSCOPY;  Service: Endoscopy;  Laterality: N/A;    Prior to Admission medications   Medication Sig Start Date End Date Taking? Authorizing Provider  ALPRAZolam Duanne Moron) 0.5 MG tablet Take  0.25 mg by mouth 2 (two) times daily as needed for anxiety. 10/27/19  Yes [provider]  amLODipine (NORVASC) 2.5 MG tablet Take 2.5 mg by mouth daily. 09/04/19  Yes [provider]  buPROPion (WELLBUTRIN SR) 150 MG 12 hr tablet Take 1 tablet (150 mg total) by mouth 2 (two) times daily. 02/20/14  Yes Robyn Haber, MD  celecoxib (CELEBREX) 200 MG capsule Take 200 mg by mouth 2 (two) times daily. 10/28/19  Yes [provider]  hydrochlorothiazide (MICROZIDE) 12.5 MG capsule Take 1 capsule (12.5 mg total) by mouth daily. 07/07/14  Yes Jeffery, Chelle, PA  lisinopril (PRINIVIL,ZESTRIL) 10 MG tablet TAKE ONE TABLET BY MOUTH ONCE DAILY Patient taking differently: Take 10 mg by mouth daily.  07/06/14  Yes Robyn Haber, MD  Vitamin D, Ergocalciferol, (DRISDOL) 50000 UNITS CAPS capsule Take 50,000 Units by mouth every 7 (seven) days. On Wednesday.   Yes [provider]  ALPRAZolam (XANAX) 0.25 MG tablet TAKE ONE TABLET BY MOUTH TWICE DAILY AS NEEDED FOR ANXIETY Patient not taking: Reported on 11/25/2019 10/28/14   Robyn Haber, MD  cephALEXin (KEFLEX) 500 MG capsule Take 1 capsule (500 mg total) by mouth 2 (two) times daily. Patient not taking: Reported on 11/25/2019 11/28/16   Forde Dandy, MD  fluticasone [facility]) 50 MCG/ACT nasal spray Place 2 sprays into both nostrils daily. Patient not taking: Reported on 11/25/2019 12/28/13   Orma Flaming, MD  ibuprofen (ADVIL,MOTRIN) 600  MG tablet Take 1 tablet (600 mg total) by mouth every 8 (eight) hours as needed. NO MORE REFILLS WITHOUT OFFICE VISIT - 2ND NOTICE Patient not taking: Reported on 11/25/2019 08/12/14   Posey Boyer, MD  traMADol (ULTRAM) 50 MG tablet Take 1 tablet (50 mg total) by mouth every 12 (twelve) hours as needed. Patient not taking: Reported on 11/25/2019 02/12/18   Okey Regal, PA-C    Current Facility-Administered Medications  Medication Dose Route Frequency Provider Last Rate Last Admin  .  acetaminophen (TYLENOL) tablet 650 mg  650 mg Oral Q6H PRN Armandina Gemma, MD       Or  . acetaminophen (TYLENOL) suppository 650 mg  650 mg Rectal Q6H PRN Armandina Gemma, MD      . dextrose 5 % and 0.45 % NaCl with KCl 20 mEq/L infusion   Intravenous Continuous Armandina Gemma, MD 50 mL/hr at 11/26/19 1351 New Bag at 11/26/19 1351  . HYDROmorphone (DILAUDID) 1 MG/ML injection           . HYDROmorphone (DILAUDID) injection 1 mg  1 mg Intravenous Q2H PRN Armandina Gemma, MD      . ondansetron (ZOFRAN-ODT) disintegrating tablet 4 mg  4 mg Oral Q6H PRN Armandina Gemma, MD       Or  . ondansetron (ZOFRAN) injection 4 mg  4 mg Intravenous Q6H PRN Armandina Gemma, MD      . oxyCODONE (Oxy IR/ROXICODONE) immediate release tablet 5-10 mg  5-10 mg Oral Q4H PRN Armandina Gemma, MD   5 mg at 11/26/19 1323  . piperacillin-tazobactam (ZOSYN) IVPB 3.375 g  3.375 g Intravenous Q8H Gerkin, Sherren Mocha, MD      . traMADol Veatrice Bourbon) tablet 50 mg  50 mg Oral Q6H PRN Armandina Gemma, MD        Allergies as of 11/25/2019  . (No Known Allergies)    Family History  Problem Relation Age of Onset  . Cervical cancer Mother   . Heart disease Mother        died of CHF  . Heart disease Father        died at 36 of heart attack  . Multiple myeloma Sister        spleen involvement  . Cancer Sister        bone marrow  . Stroke Sister   . Colon cancer Neg Hx     Social History   Socioeconomic History  . Marital status: Widowed    Spouse name: Not on file  . Number of children: 2  . Years of education: Not on file  . Highest education level: Not on file  Occupational History  . Occupation: Retired  Tobacco Use  . Smoking status: Never Smoker  . Smokeless tobacco: Never Used  Substance and Sexual Activity  . Alcohol use: No  . Drug use: No  . Sexual activity: Not on file  Other Topics Concern  . Not on file  Social History Narrative   Patient is widowed 7 years and lives alone within 5 minutes of her two daughters. She is  unemployed. Has medicare. Has a good social support with family and church friends.    Social Determinants of Health   Financial Resource Strain:   . Difficulty of Paying Living Expenses: Not on file  Food Insecurity:   . Worried About Charity fundraiser in the Last Year: Not on file  . Ran Out of Food in the Last Year: Not on file  Transportation Needs:   .  Lack of Transportation (Medical): Not on file  . Lack of Transportation (Non-Medical): Not on file  Physical Activity:   . Days of Exercise per Week: Not on file  . Minutes of Exercise per Session: Not on file  Stress:   . Feeling of Stress : Not on file  Social Connections:   . Frequency of Communication with Friends and Family: Not on file  . Frequency of Social Gatherings with Friends and Family: Not on file  . Attends Religious Services: Not on file  . Active Member of Clubs or Organizations: Not on file  . Attends Archivist Meetings: Not on file  . Marital Status: Not on file  Intimate Partner Violence:   . Fear of Current or Ex-Partner: Not on file  . Emotionally Abused: Not on file  . Physically Abused: Not on file  . Sexually Abused: Not on file    Review of Systems: ROS is O/W negative except as mentioned in HPI.  Physical Exam: Vital signs in last 24 hours: Temp:  [97.5 F (36.4 C)-100.7 F (38.2 C)] 97.8 F (36.6 C) (03/05 1140) Pulse Rate:  [42-97] 49 (03/05 1313) Resp:  [11-21] 18 (03/05 1313) BP: (109-158)/(46-70) 133/56 (03/05 1313) SpO2:  [88 %-100 %] 95 % (03/05 1313) Last BM Date: 11/22/19 General:  Alert, Well-developed, well-nourished, pleasant and cooperative in NAD, a little sleepy Head:  Normocephalic and atraumatic. Eyes:  Sclera clear, no icterus.  Conjunctiva pink. Ears:  Normal auditory acuity. Mouth:  No deformity or lesions.   Lungs:  Clear throughout to auscultation.  No wheezes, crackles, or rhonchi.  Heart:  Bradycardic but regular rhythm.  No M/R/G. Abdomen:  Soft,  non-distended.  BS quiet and sparse.  Appropriate TTP.  Msk:  Symmetrical without gross deformities. Pulses:  Normal pulses noted. Extremities:  Without clubbing or edema. Neurologic:  Alert and oriented x 4;  grossly normal neurologically. Skin:  Intact without significant lesions or rashes. Psych:  Alert and cooperative. Normal mood and affect.  Intake/Output from previous day: 03/04 0701 - 03/05 0700 In: 2398.8 [P.O.:240; I.V.:7.9; IV Piggyback:2150.9] Out: 1 [Urine:1] Intake/Output this shift: Total I/O In: 1250.4 [I.V.:1000; IV Piggyback:250.4] Out: 235 [Urine:200; Blood:35]  Lab Results: Recent Labs    11/25/19 0846 11/26/19 0539  WBC 28.3* 19.3*  HGB 15.9* 15.0  HCT 48.4* 46.9*  PLT 286 182   BMET Recent Labs    11/25/19 0846 11/26/19 0539  NA 135 134*  K 3.3* 3.7  CL 95* 98  CO2 28 24  GLUCOSE 206* 129*  BUN 22 23  CREATININE 1.33* 1.29*  CALCIUM 8.9 8.5*   LFT Recent Labs    11/26/19 0539  PROT 7.0  ALBUMIN 2.9*  AST 21  ALT 16  ALKPHOS 86  BILITOT 3.4*   Studies/Results: DG Cholangiogram Operative  Addendum Date: 11/26/2019   ADDENDUM REPORT: 11/26/2019 10:12 ADDENDUM: The original report was by Dr. Sandi Mariscal. The following addendum is by Dr. Van Clines: I spoke with Dr. Harlow Asa by telephone while he was in the operating room regarding this case. We discussed the likelihood that the filling defect in the common bile duct was likely a stone or possibly tumefactive sludge. This aligns relatively well with Dr. Pascal Lux' interpretation; unbeknownst to me, Dr. Pascal Lux was dictating this case while I was speaking to Dr. Harlow Asa about the images. Electronically Signed   By: Van Clines M.D.   On: 11/26/2019 10:12   Result Date: 11/26/2019 CLINICAL DATA:  Intraoperative cholangiogram during  laparoscopic cholecystectomy. EXAM: INTRAOPERATIVE CHOLANGIOGRAM FLUOROSCOPY TIME:  1 minutes, 15 seconds COMPARISON:  CT abdomen pelvis-11/25/2019 FINDINGS:  Intraoperative cholangiographic images of the right upper abdominal quadrant during laparoscopic cholecystectomy are provided for review. Surgical clips overlie the expected location of the gallbladder fossa. Contrast injection demonstrates selective cannulation of the central aspect of the cystic duct. There is passage of contrast through the central aspect of the cystic duct with filling of a non dilated common bile duct. There is a persistent near occlusive filling defect within the mid/distal aspect of the CBD worrisome for choledocholithiasis. Eventually, there is passage of contrast though the CBD and into the descending portion of the duodenum. There is minimal reflux of injected contrast into the common hepatic duct and central aspect of the non dilated intrahepatic biliary system. IMPRESSION: Findings worrisome for near occlusive choledocholithiasis. Electronically Signed: By: Sandi Mariscal M.D. On: 11/26/2019 10:03   CT Abdomen Pelvis W Contrast  Result Date: 11/25/2019 CLINICAL DATA:  Abdominal abscess or infection. EXAM: CT ABDOMEN AND PELVIS WITH CONTRAST TECHNIQUE: Multidetector CT imaging of the abdomen and pelvis was performed using the standard protocol following bolus administration of intravenous contrast. CONTRAST:  80 mL OMNIPAQUE IOHEXOL 300 MG/ML  SOLN COMPARISON:  June 15, 2013. FINDINGS: Lower chest: No acute abnormality. Hepatobiliary: There is a large gallstone noted in the neck of the gallbladder. The gallbladder is dilated with thickened wall and surrounding inflammatory change consistent with acute cholecystitis. No biliary dilatation is noted. No focal abnormality is noted in the liver. Pancreas: Unremarkable. No pancreatic ductal dilatation or surrounding inflammatory changes. Spleen: Normal in size without focal abnormality. Adrenals/Urinary Tract: Adrenal glands are unremarkable. Kidneys are normal, without renal calculi, focal lesion, or hydronephrosis. Bladder is  unremarkable. Stomach/Bowel: Stomach is within normal limits. Appendix appears normal. No evidence of bowel wall thickening, distention, or inflammatory changes. Vascular/Lymphatic: Aortic atherosclerosis. No enlarged abdominal or pelvic lymph nodes. Reproductive: Status post hysterectomy. No adnexal masses. Other: No abdominal wall hernia or abnormality. No abdominopelvic ascites. Musculoskeletal: No acute or significant osseous findings. IMPRESSION: Findings consistent with acute cholecystitis. Large gallstone is noted in the neck of the gallbladder. No biliary dilatation is noted. Aortic Atherosclerosis (ICD10-I70.0). Electronically Signed   By: Marijo Conception M.D.   On: 11/25/2019 10:14    IMPRESSION:  *Positive IOC:  Had lap chole with IOC today.  IOC worrisome for near occlusive choledocholithiasis.  ? Stone vs sludge vs clot vs pus.  Total bili 2.3 yesterday and 3.4 today.  Other LFT's normal. *Leukocytosis:  28.3 yesterday, down to 19.3 today.  On zosyn.  PLAN: *Will plan for ERCP with Dr. Fuller Plan on 3/6.  Indomethacin suppository ordered, but patient does have history of anal stenosis that was dilated in the past and reports issues with it returning recently.  Laban Emperor. Zehr  11/26/2019, 2:59 PM    Attending Physician Note   I have taken a history, examined the patient and reviewed the chart. I agree with the Advanced Practitioner's note, impression and recommendations.  Lap chole today for acute cholecystitis, cholelithiasis with an abnormal IOC - reviewed films and discussed with Dr. Harlow Asa: delayed distal CBD filling and then an amorphous filling defect noted. Elevated t bili, other LFTs normal.  Suspected CBD sludge, possible pus, possible stone. Plan for ERCP with possible sphincterotomy tomorrow. The risks (including pancreatitis, bleeding, perforation, infection, missed lesions, medication reactions and possible hospitalization or surgery if complications occur), benefits, and  alternatives to ERCP with possible sphincterotomy, stent, biopsy  were discussed with the patient and they consent to proceed.    Lucio Edward, MD [facility]

## 2019-11-26 NOTE — Plan of Care (Signed)
  Problem: Education: Goal: Knowledge of General Education information will improve Description: Including pain rating scale, medication(s)/side effects and non-pharmacologic comfort measures Outcome: Progressing   Problem: Health Behavior/Discharge Planning: Goal: Ability to manage health-related needs will improve Outcome: Progressing   Problem: Education: Goal: Required Educational Video(s) Outcome: Not Applicable

## 2019-11-26 NOTE — Discharge Instructions (Signed)
[facility]  Please arrive at least 30 min before your appointment to complete your check in paperwork.  If you are unable to arrive 30 min prior to your appointment time we may have to cancel or reschedule you. LAPAROSCOPIC SURGERY: POST OP INSTRUCTIONS Always review your discharge instruction sheet given to you by the facility where your surgery was performed. IF YOU HAVE DISABILITY OR FAMILY LEAVE FORMS, YOU MUST BRING THEM TO THE OFFICE FOR PROCESSING.   DO NOT GIVE THEM TO YOUR DOCTOR.  PAIN CONTROL  1. First take acetaminophen (Tylenol) AND/or ibuprofen (Advil) to control your pain after surgery.  Follow directions on package.  Taking acetaminophen (Tylenol) and/or ibuprofen (Advil) regularly after surgery will help to control your pain and lower the amount of prescription pain medication you may need.  You should not take more than 4,000 mg (4 grams) of acetaminophen (Tylenol) in 24 hours.  You should not take ibuprofen (Advil), aleve, motrin, naprosyn or other NSAIDS if you have a history of stomach ulcers or chronic kidney disease.  2. A prescription for pain medication may be given to you upon discharge.  Take your pain medication as prescribed, if you still have uncontrolled pain after taking acetaminophen (Tylenol) or ibuprofen (Advil). 3. Use ice packs to help control pain. 4. If you need a refill on your pain medication, please contact your pharmacy.  They will contact our office to request authorization. Prescriptions will not be filled after 5pm or on week-ends.  HOME MEDICATIONS 5. Take your usually prescribed medications unless otherwise directed.  DIET 6. You should follow a light diet the first few days after arrival home.  Be sure to include lots of fluids daily. Avoid fatty, fried foods.   CONSTIPATION 7. It is common to experience some constipation after surgery and if you are taking pain medication.  Increasing fluid intake and taking a stool  softener (such as Colace) will usually help or prevent this problem from occurring.  A mild laxative (Milk of Magnesia or Miralax) should be taken according to package instructions if there are no bowel movements after 48 hours.  WOUND/INCISION CARE 8. Most patients will experience some swelling and bruising in the area of the incisions.  Ice packs will help.  Swelling and bruising can take several days to resolve.  9. Unless discharge instructions indicate otherwise, follow guidelines below  a. STERI-STRIPS - you may remove your outer bandages 48 hours after surgery, and you may shower at that time.  You have steri-strips (small skin tapes) in place directly over the incision.  These strips should be left on the skin for 7-10 days.   b. DERMABOND/SKIN GLUE - you may shower in 24 hours.  The glue will flake off over the next 2-3 weeks. 10. Any sutures or staples will be removed at the office during your follow-up visit.  ACTIVITIES 11. You may resume regular (light) daily activities beginning the next day--such as daily self-care, walking, climbing stairs--gradually increasing activities as tolerated.  You may have sexual intercourse when it is comfortable.  Refrain from any heavy lifting or straining until approved by your doctor. a. You may drive when you are no longer taking prescription pain medication, you can comfortably wear a seatbelt, and you can safely maneuver your car and apply brakes.  FOLLOW-UP 12. You should see your doctor in the office for a follow-up appointment approximately 2-3 weeks after your surgery.  You should have been given your post-op/follow-up appointment when   your surgery was scheduled.  If you did not receive a post-op/follow-up appointment, make sure that you call for this appointment within a day or two after you arrive home to insure a convenient appointment time.   WHEN TO CALL YOUR DOCTOR: 1. Fever over 101.0 2. Inability to urinate 3. Continued bleeding from  incision. 4. Increased pain, redness, or drainage from the incision. 5. Increasing abdominal pain  The clinic staff is available to answer your questions during regular business hours.  Please don't hesitate to call and ask to speak to one of the nurses for clinical concerns.  If you have a medical emergency, go to the nearest emergency room or call 911.  A surgeon from [facility] is always on call at the hospital. [address], [address], [place], [place]  [address] ? [address] [address], [place], [place]   [address] [phone] ? [phone] ? FAX [phone]  .........   Managing Your Pain After Surgery Without Opioids    Thank you for participating in our program to help patients manage their pain after surgery without opioids. This is part of our effort to provide you with the best care possible, without exposing you or your family to the risk that opioids pose.  What pain can I expect after surgery? You can expect to have some pain after surgery. This is normal. The pain is typically worse the day after surgery, and quickly begins to get better. Many studies have found that many patients are able to manage their pain after surgery with Over-the-Counter (OTC) medications such as Tylenol and Motrin. If you have a condition that does not allow you to take Tylenol or Motrin, notify your surgical team.  How will I manage my pain? The best strategy for controlling your pain after surgery is around the clock pain control with Tylenol (acetaminophen) and Motrin (ibuprofen or Advil). Alternating these medications with each other allows you to maximize your pain control. In addition to Tylenol and Motrin, you can use heating pads or ice packs on your incisions to help reduce your pain.  How will I alternate your regular strength over-the-counter pain medication? You will take a dose of pain medication every three hours. ; Start by taking 650 mg of Tylenol (2 pills of 325  mg) ; 3 hours later take 600 mg of Motrin (3 pills of 200 mg) ; 3 hours after taking the Motrin take 650 mg of Tylenol ; 3 hours after that take 600 mg of Motrin.   - 1 -  See example - if your first dose of Tylenol is at 12:00 PM   12:00 PM Tylenol 650 mg (2 pills of 325 mg)  3:00 PM Motrin 600 mg (3 pills of 200 mg)  6:00 PM Tylenol 650 mg (2 pills of 325 mg)  9:00 PM Motrin 600 mg (3 pills of 200 mg)  Continue alternating every 3 hours   We recommend that you follow this schedule around-the-clock for at least 3 days after surgery, or until you feel that it is no longer needed. Use the table on the last page of this handout to keep track of the medications you are taking. Important: Do not take more than 3000mg of Tylenol or 3200mg of Motrin in a 24-hour period. Do not take ibuprofen/Motrin if you have a history of bleeding stomach ulcers, severe kidney disease, &/or actively taking a blood thinner  What if I still have pain? If you have pain that is not   controlled with the over-the-counter pain medications (Tylenol and Motrin or Advil) you might have what we call "breakthrough" pain. You will receive a prescription for a small amount of an opioid pain medication such as Oxycodone, Tramadol, or Tylenol with Codeine. Use these opioid pills in the first 24 hours after surgery if you have breakthrough pain. Do not take more than 1 pill every 4-6 hours.  If you still have uncontrolled pain after using all opioid pills, don't hesitate to call our staff using the number provided. We will help make sure you are managing your pain in the best way possible, and if necessary, we can provide a prescription for additional pain medication.   Day 1    Time  Name of Medication Number of pills taken  Amount of Acetaminophen  Pain Level   Comments  AM PM       AM PM       AM PM       AM PM       AM PM       AM PM       AM PM       AM PM       Total Daily amount of Acetaminophen Do not  take more than  3,000 mg per day      Day 2    Time  Name of Medication Number of pills taken  Amount of Acetaminophen  Pain Level   Comments  AM PM       AM PM       AM PM       AM PM       AM PM       AM PM       AM PM       AM PM       Total Daily amount of Acetaminophen Do not take more than  3,000 mg per day      Day 3    Time  Name of Medication Number of pills taken  Amount of Acetaminophen  Pain Level   Comments  AM PM       AM PM       AM PM       AM PM          AM PM       AM PM       AM PM       AM PM       Total Daily amount of Acetaminophen Do not take more than  3,000 mg per day      Day 4    Time  Name of Medication Number of pills taken  Amount of Acetaminophen  Pain Level   Comments  AM PM       AM PM       AM PM       AM PM       AM PM       AM PM       AM PM       AM PM       Total Daily amount of Acetaminophen Do not take more than  3,000 mg per day      Day 5    Time  Name of Medication Number of pills taken  Amount of Acetaminophen  Pain Level   Comments  AM PM       AM PM       AM   PM       AM PM       AM PM       AM PM       AM PM       AM PM       Total Daily amount of Acetaminophen Do not take more than  3,000 mg per day       Day 6    Time  Name of Medication Number of pills taken  Amount of Acetaminophen  Pain Level  Comments  AM PM       AM PM       AM PM       AM PM       AM PM       AM PM       AM PM       AM PM       Total Daily amount of Acetaminophen Do not take more than  3,000 mg per day      Day 7    Time  Name of Medication Number of pills taken  Amount of Acetaminophen  Pain Level   Comments  AM PM       AM PM       AM PM       AM PM       AM PM       AM PM       AM PM       AM PM       Total Daily amount of Acetaminophen Do not take more than  3,000 mg per day        For additional information about how and where to safely dispose of unused  opioid medications - https://www.morepowerfulnc.org  Disclaimer: This document contains information and/or instructional materials adapted from [facility] for the typical patient with your condition. It does not replace medical advice from your health care provider because your experience may differ from that of the typical patient. Talk to your health care provider if you have any questions about this document, your condition or your treatment plan. Adapted from [facility]   

## 2019-11-26 NOTE — Anesthesia Preprocedure Evaluation (Addendum)
Anesthesia Evaluation  Patient identified by MRN, date of birth, ID band Patient awake    Reviewed: Allergy & Precautions, H&P , NPO status , Patient's Chart, lab work & pertinent test results  Airway Mallampati: III  TM Distance: >3 FB Neck ROM: Full    Dental no notable dental hx. (+) Teeth Intact, Dental Advisory Given   Pulmonary neg pulmonary ROS,    Pulmonary exam normal breath sounds clear to auscultation       Cardiovascular hypertension, Pt. on medications  Rhythm:Regular Rate:Normal     Neuro/Psych Anxiety Depression negative neurological ROS     GI/Hepatic negative GI ROS, Neg liver ROS,   Endo/Other  Morbid obesity  Renal/GU Renal InsufficiencyRenal disease  negative genitourinary   Musculoskeletal   Abdominal   Peds  Hematology negative hematology ROS (+)   Anesthesia Other Findings   Reproductive/Obstetrics negative OB ROS                            Anesthesia Physical Anesthesia Plan  ASA: III  Anesthesia Plan: General   Post-op Pain Management:    Induction: Intravenous  PONV Risk Score and Plan: 4 or greater and Ondansetron, Dexamethasone and Treatment may vary due to age or medical condition  Airway Management Planned: Oral ETT  Additional Equipment:   Intra-op Plan:   Post-operative Plan: Extubation in OR  Informed Consent: I have reviewed the patients History and Physical, chart, labs and discussed the procedure including the risks, benefits and alternatives for the proposed anesthesia with the patient or authorized representative who has indicated his/her understanding and acceptance.     Dental advisory given  Plan Discussed with: CRNA  Anesthesia Plan Comments:         Anesthesia Quick Evaluation

## 2019-11-26 NOTE — H&P (View-Only) (Signed)
Referring Provider:  Dr. Harlow Asa, CCS Primary Care Physician:  Benito Mccreedy, MD Primary Gastroenterologist:  Dr. Olevia Perches previously  Reason for Consultation:  Positive IOC  HPI: Kathryn Thomas is a 79 y.o. female who underwent lap chole with IOC by Dr. Harlow Asa earlier this morning.  IOC positive/worrisome for near occlusive choledocholithiasis, ? Stone vs sludge vs pus vs clot.  CT scan of the abdomen and pelvis yesterday showed the following:  IMPRESSION: Findings consistent with acute cholecystitis. Large gallstone is noted in the neck of the gallbladder. No biliary dilatation is Noted.  LFT's normal except total bili 3.4 today and was 2.8 yesterday.  Has leukocytosis, which has trended down on Zosyn.  Daughter was present at bedside as patient was still somewhat sleepy from her surgery.  Complaining of some nausea.  She has history of anal stenosis that has been dilated in the past.  She says that she has noticed that it has become narrowed again as she has very thin stools and sometimes has a hard time passing her stools.  Past Medical History:  Diagnosis Date  . Acute renal failure ([place])   . Anal stenosis 2010  . Anxiety   . Blood transfusion    " no reaction to transfusion "  . Depression   . External hemorrhoids   . Gastric polyp   . Gastritis   . Hypertension   . Intestinal metaplasia of gastric mucosa 09/2011   high grade dysplasia  . Pernicious anemia   . Shortness of breath   . Vitamin B12 deficiency     Past Surgical History:  Procedure Laterality Date  . ABDOMINAL HYSTERECTOMY    . BACK SURGERY  1980   three lower spine surgeries.  . ESOPHAGOGASTRODUODENOSCOPY  10/01/2011   Procedure: ESOPHAGOGASTRODUODENOSCOPY (EGD);  Surgeon: Lafayette Dragon, MD;  Location: [facility] ENDOSCOPY;  Service: Endoscopy;  Laterality: N/A;    Prior to Admission medications   Medication Sig Start Date End Date Taking? Authorizing Provider  ALPRAZolam Duanne Moron) 0.5 MG tablet Take  0.25 mg by mouth 2 (two) times daily as needed for anxiety. 10/27/19  Yes [provider]  amLODipine (NORVASC) 2.5 MG tablet Take 2.5 mg by mouth daily. 09/04/19  Yes [provider]  buPROPion (WELLBUTRIN SR) 150 MG 12 hr tablet Take 1 tablet (150 mg total) by mouth 2 (two) times daily. 02/20/14  Yes Robyn Haber, MD  celecoxib (CELEBREX) 200 MG capsule Take 200 mg by mouth 2 (two) times daily. 10/28/19  Yes [provider]  hydrochlorothiazide (MICROZIDE) 12.5 MG capsule Take 1 capsule (12.5 mg total) by mouth daily. 07/07/14  Yes Jeffery, Chelle, PA  lisinopril (PRINIVIL,ZESTRIL) 10 MG tablet TAKE ONE TABLET BY MOUTH ONCE DAILY Patient taking differently: Take 10 mg by mouth daily.  07/06/14  Yes Robyn Haber, MD  Vitamin D, Ergocalciferol, (DRISDOL) 50000 UNITS CAPS capsule Take 50,000 Units by mouth every 7 (seven) days. On Wednesday.   Yes [provider]  ALPRAZolam (XANAX) 0.25 MG tablet TAKE ONE TABLET BY MOUTH TWICE DAILY AS NEEDED FOR ANXIETY Patient not taking: Reported on 11/25/2019 10/28/14   Robyn Haber, MD  cephALEXin (KEFLEX) 500 MG capsule Take 1 capsule (500 mg total) by mouth 2 (two) times daily. Patient not taking: Reported on 11/25/2019 11/28/16   Forde Dandy, MD  fluticasone [facility]) 50 MCG/ACT nasal spray Place 2 sprays into both nostrils daily. Patient not taking: Reported on 11/25/2019 12/28/13   Orma Flaming, MD  ibuprofen (ADVIL,MOTRIN) 600  MG tablet Take 1 tablet (600 mg total) by mouth every 8 (eight) hours as needed. NO MORE REFILLS WITHOUT OFFICE VISIT - 2ND NOTICE Patient not taking: Reported on 11/25/2019 08/12/14   Posey Boyer, MD  traMADol (ULTRAM) 50 MG tablet Take 1 tablet (50 mg total) by mouth every 12 (twelve) hours as needed. Patient not taking: Reported on 11/25/2019 02/12/18   Okey Regal, PA-C    Current Facility-Administered Medications  Medication Dose Route Frequency Provider Last Rate Last Admin  .  acetaminophen (TYLENOL) tablet 650 mg  650 mg Oral Q6H PRN Armandina Gemma, MD       Or  . acetaminophen (TYLENOL) suppository 650 mg  650 mg Rectal Q6H PRN Armandina Gemma, MD      . dextrose 5 % and 0.45 % NaCl with KCl 20 mEq/L infusion   Intravenous Continuous Armandina Gemma, MD 50 mL/hr at 11/26/19 1351 New Bag at 11/26/19 1351  . HYDROmorphone (DILAUDID) 1 MG/ML injection           . HYDROmorphone (DILAUDID) injection 1 mg  1 mg Intravenous Q2H PRN Armandina Gemma, MD      . ondansetron (ZOFRAN-ODT) disintegrating tablet 4 mg  4 mg Oral Q6H PRN Armandina Gemma, MD       Or  . ondansetron (ZOFRAN) injection 4 mg  4 mg Intravenous Q6H PRN Armandina Gemma, MD      . oxyCODONE (Oxy IR/ROXICODONE) immediate release tablet 5-10 mg  5-10 mg Oral Q4H PRN Armandina Gemma, MD   5 mg at 11/26/19 1323  . piperacillin-tazobactam (ZOSYN) IVPB 3.375 g  3.375 g Intravenous Q8H Gerkin, Sherren Mocha, MD      . traMADol Veatrice Bourbon) tablet 50 mg  50 mg Oral Q6H PRN Armandina Gemma, MD        Allergies as of 11/25/2019  . (No Known Allergies)    Family History  Problem Relation Age of Onset  . Cervical cancer Mother   . Heart disease Mother        died of CHF  . Heart disease Father        died at 83 of heart attack  . Multiple myeloma Sister        spleen involvement  . Cancer Sister        bone marrow  . Stroke Sister   . Colon cancer Neg Hx     Social History   Socioeconomic History  . Marital status: Widowed    Spouse name: Not on file  . Number of children: 2  . Years of education: Not on file  . Highest education level: Not on file  Occupational History  . Occupation: Retired  Tobacco Use  . Smoking status: Never Smoker  . Smokeless tobacco: Never Used  Substance and Sexual Activity  . Alcohol use: No  . Drug use: No  . Sexual activity: Not on file  Other Topics Concern  . Not on file  Social History Narrative   Patient is widowed 7 years and lives alone within 5 minutes of her two daughters. She is  unemployed. Has medicare. Has a good social support with family and church friends.    Social Determinants of Health   Financial Resource Strain:   . Difficulty of Paying Living Expenses: Not on file  Food Insecurity:   . Worried About Charity fundraiser in the Last Year: Not on file  . Ran Out of Food in the Last Year: Not on file  Transportation Needs:   .  Lack of Transportation (Medical): Not on file  . Lack of Transportation (Non-Medical): Not on file  Physical Activity:   . Days of Exercise per Week: Not on file  . Minutes of Exercise per Session: Not on file  Stress:   . Feeling of Stress : Not on file  Social Connections:   . Frequency of Communication with Friends and Family: Not on file  . Frequency of Social Gatherings with Friends and Family: Not on file  . Attends Religious Services: Not on file  . Active Member of Clubs or Organizations: Not on file  . Attends Archivist Meetings: Not on file  . Marital Status: Not on file  Intimate Partner Violence:   . Fear of Current or Ex-Partner: Not on file  . Emotionally Abused: Not on file  . Physically Abused: Not on file  . Sexually Abused: Not on file    Review of Systems: ROS is O/W negative except as mentioned in HPI.  Physical Exam: Vital signs in last 24 hours: Temp:  [97.5 F (36.4 C)-100.7 F (38.2 C)] 97.8 F (36.6 C) (03/05 1140) Pulse Rate:  [42-97] 49 (03/05 1313) Resp:  [11-21] 18 (03/05 1313) BP: (109-158)/(46-70) 133/56 (03/05 1313) SpO2:  [88 %-100 %] 95 % (03/05 1313) Last BM Date: 11/22/19 General:  Alert, Well-developed, well-nourished, pleasant and cooperative in NAD, a little sleepy Head:  Normocephalic and atraumatic. Eyes:  Sclera clear, no icterus.  Conjunctiva pink. Ears:  Normal auditory acuity. Mouth:  No deformity or lesions.   Lungs:  Clear throughout to auscultation.  No wheezes, crackles, or rhonchi.  Heart:  Bradycardic but regular rhythm.  No M/R/G. Abdomen:  Soft,  non-distended.  BS quiet and sparse.  Appropriate TTP.  Msk:  Symmetrical without gross deformities. Pulses:  Normal pulses noted. Extremities:  Without clubbing or edema. Neurologic:  Alert and oriented x 4;  grossly normal neurologically. Skin:  Intact without significant lesions or rashes. Psych:  Alert and cooperative. Normal mood and affect.  Intake/Output from previous day: 03/04 0701 - 03/05 0700 In: 2398.8 [P.O.:240; I.V.:7.9; IV Piggyback:2150.9] Out: 1 [Urine:1] Intake/Output this shift: Total I/O In: 1250.4 [I.V.:1000; IV Piggyback:250.4] Out: 235 [Urine:200; Blood:35]  Lab Results: Recent Labs    11/25/19 0846 11/26/19 0539  WBC 28.3* 19.3*  HGB 15.9* 15.0  HCT 48.4* 46.9*  PLT 286 182   BMET Recent Labs    11/25/19 0846 11/26/19 0539  NA 135 134*  K 3.3* 3.7  CL 95* 98  CO2 28 24  GLUCOSE 206* 129*  BUN 22 23  CREATININE 1.33* 1.29*  CALCIUM 8.9 8.5*   LFT Recent Labs    11/26/19 0539  PROT 7.0  ALBUMIN 2.9*  AST 21  ALT 16  ALKPHOS 86  BILITOT 3.4*   Studies/Results: DG Cholangiogram Operative  Addendum Date: 11/26/2019   ADDENDUM REPORT: 11/26/2019 10:12 ADDENDUM: The original report was by Dr. Sandi Mariscal. The following addendum is by Dr. Van Clines: I spoke with Dr. Harlow Asa by telephone while he was in the operating room regarding this case. We discussed the likelihood that the filling defect in the common bile duct was likely a stone or possibly tumefactive sludge. This aligns relatively well with Dr. Pascal Lux' interpretation; unbeknownst to me, Dr. Pascal Lux was dictating this case while I was speaking to Dr. Harlow Asa about the images. Electronically Signed   By: Van Clines M.D.   On: 11/26/2019 10:12   Result Date: 11/26/2019 CLINICAL DATA:  Intraoperative cholangiogram during  laparoscopic cholecystectomy. EXAM: INTRAOPERATIVE CHOLANGIOGRAM FLUOROSCOPY TIME:  1 minutes, 15 seconds COMPARISON:  CT abdomen pelvis-11/25/2019 FINDINGS:  Intraoperative cholangiographic images of the right upper abdominal quadrant during laparoscopic cholecystectomy are provided for review. Surgical clips overlie the expected location of the gallbladder fossa. Contrast injection demonstrates selective cannulation of the central aspect of the cystic duct. There is passage of contrast through the central aspect of the cystic duct with filling of a non dilated common bile duct. There is a persistent near occlusive filling defect within the mid/distal aspect of the CBD worrisome for choledocholithiasis. Eventually, there is passage of contrast though the CBD and into the descending portion of the duodenum. There is minimal reflux of injected contrast into the common hepatic duct and central aspect of the non dilated intrahepatic biliary system. IMPRESSION: Findings worrisome for near occlusive choledocholithiasis. Electronically Signed: By: Sandi Mariscal M.D. On: 11/26/2019 10:03   CT Abdomen Pelvis W Contrast  Result Date: 11/25/2019 CLINICAL DATA:  Abdominal abscess or infection. EXAM: CT ABDOMEN AND PELVIS WITH CONTRAST TECHNIQUE: Multidetector CT imaging of the abdomen and pelvis was performed using the standard protocol following bolus administration of intravenous contrast. CONTRAST:  80 mL OMNIPAQUE IOHEXOL 300 MG/ML  SOLN COMPARISON:  June 15, 2013. FINDINGS: Lower chest: No acute abnormality. Hepatobiliary: There is a large gallstone noted in the neck of the gallbladder. The gallbladder is dilated with thickened wall and surrounding inflammatory change consistent with acute cholecystitis. No biliary dilatation is noted. No focal abnormality is noted in the liver. Pancreas: Unremarkable. No pancreatic ductal dilatation or surrounding inflammatory changes. Spleen: Normal in size without focal abnormality. Adrenals/Urinary Tract: Adrenal glands are unremarkable. Kidneys are normal, without renal calculi, focal lesion, or hydronephrosis. Bladder is  unremarkable. Stomach/Bowel: Stomach is within normal limits. Appendix appears normal. No evidence of bowel wall thickening, distention, or inflammatory changes. Vascular/Lymphatic: Aortic atherosclerosis. No enlarged abdominal or pelvic lymph nodes. Reproductive: Status post hysterectomy. No adnexal masses. Other: No abdominal wall hernia or abnormality. No abdominopelvic ascites. Musculoskeletal: No acute or significant osseous findings. IMPRESSION: Findings consistent with acute cholecystitis. Large gallstone is noted in the neck of the gallbladder. No biliary dilatation is noted. Aortic Atherosclerosis (ICD10-I70.0). Electronically Signed   By: Marijo Conception M.D.   On: 11/25/2019 10:14    IMPRESSION:  *Positive IOC:  Had lap chole with IOC today.  IOC worrisome for near occlusive choledocholithiasis.  ? Stone vs sludge vs clot vs pus.  Total bili 2.3 yesterday and 3.4 today.  Other LFT's normal. *Leukocytosis:  28.3 yesterday, down to 19.3 today.  On zosyn.  PLAN: *Will plan for ERCP with Dr. Fuller Plan on 3/6.  Indomethacin suppository ordered, but patient does have history of anal stenosis that was dilated in the past and reports issues with it returning recently.  Laban Emperor. Zehr  11/26/2019, 2:59 PM    Attending Physician Note   I have taken a history, examined the patient and reviewed the chart. I agree with the Advanced Practitioner's note, impression and recommendations.  Lap chole today for acute cholecystitis, cholelithiasis with an abnormal IOC - reviewed films and discussed with Dr. Harlow Asa: delayed distal CBD filling and then an amorphous filling defect noted. Elevated t bili, other LFTs normal.  Suspected CBD sludge, possible pus, possible stone. Plan for ERCP with possible sphincterotomy tomorrow. The risks (including pancreatitis, bleeding, perforation, infection, missed lesions, medication reactions and possible hospitalization or surgery if complications occur), benefits, and  alternatives to ERCP with possible sphincterotomy, stent, biopsy  were discussed with the patient and they consent to proceed.    Lucio Edward, MD [facility]

## 2019-11-26 NOTE — Anesthesia Procedure Notes (Signed)
Procedure Name: Intubation Date/Time: 11/26/2019 8:48 AM Performed by: Claudia Desanctis, CRNA Pre-anesthesia Checklist: Patient identified, Emergency Drugs available, Suction available and Patient being monitored Patient Re-evaluated:Patient Re-evaluated prior to induction Oxygen Delivery Method: Circle system utilized Preoxygenation: Pre-oxygenation with 100% oxygen Induction Type: IV induction, Rapid sequence and Cricoid Pressure applied Laryngoscope Size: 2 and Miller Grade View: Grade I Tube type: Oral Number of attempts: 1 Airway Equipment and Method: Stylet Placement Confirmation: ETT inserted through vocal cords under direct vision,  positive ETCO2 and breath sounds checked- equal and bilateral Secured at: 21 cm Tube secured with: Tape Dental Injury: Teeth and Oropharynx as per pre-operative assessment

## 2019-11-27 ENCOUNTER — Inpatient Hospital Stay (HOSPITAL_COMMUNITY): Payer: Medicare HMO

## 2019-11-27 ENCOUNTER — Encounter (HOSPITAL_COMMUNITY): Admission: EM | Disposition: A | Discharge status: Home / Self Care | Payer: Self-pay | Source: Home / Self Care

## 2019-11-27 ENCOUNTER — Inpatient Hospital Stay (HOSPITAL_COMMUNITY): Payer: Medicare HMO | Admitting: Certified Registered"

## 2019-11-27 DIAGNOSIS — R17 Unspecified jaundice: Secondary | ICD-10-CM

## 2019-11-27 DIAGNOSIS — R932 Abnormal findings on diagnostic imaging of liver and biliary tract: Secondary | ICD-10-CM

## 2019-11-27 DIAGNOSIS — R0600 Dyspnea, unspecified: Secondary | ICD-10-CM

## 2019-11-27 HISTORY — PX: SPHINCTEROTOMY: SHX5279

## 2019-11-27 HISTORY — PX: ERCP: SHX5425

## 2019-11-27 HISTORY — PX: REMOVAL OF STONES: SHX5545

## 2019-11-27 SURGERY — ERCP, WITH INTERVENTION IF INDICATED
Anesthesia: General

## 2019-11-27 MED ORDER — PHENYLEPHRINE HCL-NACL 10-0.9 MG/250ML-% IV SOLN
INTRAVENOUS | Status: DC | PRN
  Administered 2019-11-27: 50 ug/min via INTRAVENOUS

## 2019-11-27 MED ORDER — HYDROCORTISONE (PERIANAL) 2.5 % EX CREA: 1.0000 "application " | TOPICAL_CREAM | Freq: Four times a day (QID) | CUTANEOUS | Status: DC | PRN

## 2019-11-27 MED ORDER — BISACODYL 10 MG RE SUPP
10.0000 mg | Freq: Two times a day (BID) | RECTAL | Status: DC | PRN
  Administered 2019-11-28: 10 mg via RECTAL
  Filled 2019-11-27: qty 1

## 2019-11-27 MED ORDER — SODIUM CHLORIDE 0.9 % IV SOLN: INTRAVENOUS | Status: DC

## 2019-11-27 MED ORDER — INDOMETHACIN 50 MG RE SUPP
RECTAL | Status: DC | PRN
  Administered 2019-11-27: 100 mg via RECTAL

## 2019-11-27 MED ORDER — GUAIFENESIN-DM 100-10 MG/5ML PO SYRP: 10.0000 mL | ORAL_SOLUTION | ORAL | Status: DC | PRN

## 2019-11-27 MED ORDER — FENTANYL CITRATE (PF) 100 MCG/2ML IJ SOLN
INTRAMUSCULAR | Status: DC | PRN
  Administered 2019-11-27 (×2): 50 ug via INTRAVENOUS

## 2019-11-27 MED ORDER — MAGIC MOUTHWASH
15.0000 mL | Freq: Four times a day (QID) | ORAL | Status: DC | PRN
  Filled 2019-11-27: qty 15

## 2019-11-27 MED ORDER — MEPERIDINE HCL 25 MG/ML IJ SOLN: 6.2500 mg | INTRAMUSCULAR | Status: AC | PRN

## 2019-11-27 MED ORDER — GLUCAGON HCL RDNA (DIAGNOSTIC) 1 MG IJ SOLR
INTRAMUSCULAR | Status: AC
  Filled 2019-11-27: qty 2

## 2019-11-27 MED ORDER — INDOMETHACIN 50 MG RE SUPP
RECTAL | Status: AC
  Filled 2019-11-27: qty 2

## 2019-11-27 MED ORDER — LIP MEDEX EX OINT
1.0000 "application " | TOPICAL_OINTMENT | Freq: Two times a day (BID) | CUTANEOUS | Status: DC
  Administered 2019-11-27 – 2019-12-01 (×7): 1 via TOPICAL
  Filled 2019-11-27 (×6): qty 7

## 2019-11-27 MED ORDER — LABETALOL HCL 5 MG/ML IV SOLN
INTRAVENOUS | Status: AC
  Administered 2019-11-27: 5 mg
  Filled 2019-11-27: qty 4

## 2019-11-27 MED ORDER — HYDROMORPHONE HCL 1 MG/ML IJ SOLN: 0.2500 mg | INTRAMUSCULAR | Status: AC | PRN

## 2019-11-27 MED ORDER — ONDANSETRON HCL 4 MG/2ML IJ SOLN
4.0000 mg | Freq: Once | INTRAMUSCULAR | Status: DC | PRN
  Filled 2019-11-27 (×2): qty 2

## 2019-11-27 MED ORDER — PHENYLEPHRINE 40 MCG/ML (10ML) SYRINGE FOR IV PUSH (FOR BLOOD PRESSURE SUPPORT)
PREFILLED_SYRINGE | INTRAVENOUS | Status: DC | PRN
  Administered 2019-11-27 (×2): 120 ug via INTRAVENOUS

## 2019-11-27 MED ORDER — INDOMETHACIN 50 MG RE SUPP: 100.0000 mg | Freq: Once | RECTAL | Status: DC

## 2019-11-27 MED ORDER — ONDANSETRON HCL 4 MG/2ML IJ SOLN
INTRAMUSCULAR | Status: DC | PRN
  Administered 2019-11-27: 4 mg via INTRAVENOUS

## 2019-11-27 MED ORDER — SODIUM CHLORIDE 0.9 % IV SOLN
8.0000 mg | Freq: Four times a day (QID) | INTRAVENOUS | Status: DC | PRN
  Filled 2019-11-27: qty 4

## 2019-11-27 MED ORDER — FENTANYL CITRATE (PF) 100 MCG/2ML IJ SOLN
INTRAMUSCULAR | Status: AC
  Filled 2019-11-27: qty 2

## 2019-11-27 MED ORDER — ALPRAZOLAM 0.25 MG PO TABS
0.2500 mg | ORAL_TABLET | Freq: Two times a day (BID) | ORAL | Status: DC | PRN
  Administered 2019-11-27 – 2019-11-28 (×2): 0.25 mg via ORAL
  Filled 2019-11-27 (×3): qty 1

## 2019-11-27 MED ORDER — LACTATED RINGERS IV BOLUS: 1000.0000 mL | Freq: Three times a day (TID) | INTRAVENOUS | Status: AC | PRN

## 2019-11-27 MED ORDER — PHENOL 1.4 % MT LIQD: 1.0000 | OROMUCOSAL | Status: DC | PRN

## 2019-11-27 MED ORDER — CIPROFLOXACIN IN D5W 400 MG/200ML IV SOLN
INTRAVENOUS | Status: DC | PRN
  Administered 2019-11-27: 400 mg via INTRAVENOUS

## 2019-11-27 MED ORDER — LIDOCAINE 2% (20 MG/ML) 5 ML SYRINGE
INTRAMUSCULAR | Status: DC | PRN
  Administered 2019-11-27: 100 mg via INTRAVENOUS

## 2019-11-27 MED ORDER — HYDROCORTISONE 1 % EX CREA
1.0000 "application " | TOPICAL_CREAM | Freq: Three times a day (TID) | CUTANEOUS | Status: DC | PRN
  Administered 2019-11-29: 1 via TOPICAL
  Filled 2019-11-27: qty 28

## 2019-11-27 MED ORDER — MENTHOL 3 MG MT LOZG: 1.0000 | LOZENGE | OROMUCOSAL | Status: DC | PRN

## 2019-11-27 MED ORDER — DIPHENHYDRAMINE HCL 25 MG PO CAPS
25.0000 mg | ORAL_CAPSULE | Freq: Four times a day (QID) | ORAL | Status: DC | PRN
  Administered 2019-11-28: 25 mg via ORAL
  Filled 2019-11-27 (×2): qty 1

## 2019-11-27 MED ORDER — POLYETHYLENE GLYCOL 3350 17 G PO PACK
17.0000 g | PACK | Freq: Two times a day (BID) | ORAL | Status: DC | PRN
  Administered 2019-11-28: 17 g via ORAL

## 2019-11-27 MED ORDER — DEXAMETHASONE SODIUM PHOSPHATE 10 MG/ML IJ SOLN
INTRAMUSCULAR | Status: DC | PRN
  Administered 2019-11-27: 8 mg via INTRAVENOUS

## 2019-11-27 MED ORDER — PROCHLORPERAZINE EDISYLATE 10 MG/2ML IJ SOLN: 5.0000 mg | INTRAMUSCULAR | Status: DC | PRN

## 2019-11-27 MED ORDER — PROPOFOL 10 MG/ML IV BOLUS
INTRAVENOUS | Status: DC | PRN
  Administered 2019-11-27: 120 mg via INTRAVENOUS

## 2019-11-27 MED ORDER — SUGAMMADEX SODIUM 200 MG/2ML IV SOLN
INTRAVENOUS | Status: DC | PRN
  Administered 2019-11-27: 225 mg via INTRAVENOUS

## 2019-11-27 MED ORDER — ONDANSETRON HCL 4 MG/2ML IJ SOLN
4.0000 mg | Freq: Four times a day (QID) | INTRAMUSCULAR | Status: DC | PRN
  Administered 2019-11-28 – 2019-11-29 (×2): 4 mg via INTRAVENOUS

## 2019-11-27 MED ORDER — CIPROFLOXACIN IN D5W 400 MG/200ML IV SOLN
INTRAVENOUS | Status: AC
  Filled 2019-11-27: qty 200

## 2019-11-27 MED ORDER — METHOCARBAMOL 1000 MG/10ML IJ SOLN
1000.0000 mg | Freq: Four times a day (QID) | INTRAVENOUS | Status: DC | PRN
  Filled 2019-11-27: qty 10

## 2019-11-27 MED ORDER — LACTATED RINGERS IV SOLN: INTRAVENOUS | Status: DC | PRN

## 2019-11-27 MED ORDER — ALUM & MAG HYDROXIDE-SIMETH 200-200-20 MG/5ML PO SUSP: 30.0000 mL | Freq: Four times a day (QID) | ORAL | Status: DC | PRN

## 2019-11-27 MED ORDER — SODIUM CHLORIDE 0.9 % IV SOLN
INTRAVENOUS | Status: DC | PRN
  Administered 2019-11-27: 22 mL

## 2019-11-27 MED ORDER — ROCURONIUM BROMIDE 10 MG/ML (PF) SYRINGE
PREFILLED_SYRINGE | INTRAVENOUS | Status: DC | PRN
  Administered 2019-11-27: 100 mg via INTRAVENOUS

## 2019-11-27 MED ORDER — DIPHENHYDRAMINE HCL 50 MG/ML IJ SOLN: 12.5000 mg | Freq: Four times a day (QID) | INTRAMUSCULAR | Status: DC | PRN

## 2019-11-27 MED ORDER — LABETALOL HCL 5 MG/ML IV SOLN
5.0000 mg | Freq: Once | INTRAVENOUS | Status: AC
  Administered 2019-11-27: 5 mg via INTRAVENOUS

## 2019-11-27 MED ORDER — GLUCAGON HCL RDNA (DIAGNOSTIC) 1 MG IJ SOLR
INTRAMUSCULAR | Status: DC | PRN
  Administered 2019-11-27 (×4): .25 mg via INTRAVENOUS

## 2019-11-27 NOTE — Anesthesia Procedure Notes (Signed)
Procedure Name: Intubation Date/Time: 11/27/2019 10:00 AM Performed by: Silas Sacramento, CRNA Pre-anesthesia Checklist: Patient identified, Emergency Drugs available, Suction available and Patient being monitored Patient Re-evaluated:Patient Re-evaluated prior to induction Oxygen Delivery Method: Circle system utilized Preoxygenation: Pre-oxygenation with 100% oxygen Induction Type: IV induction Ventilation: Mask ventilation without difficulty Laryngoscope Size: Mac and 4 Grade View: Grade I Tube type: Oral Tube size: 7.0 mm Number of attempts: 1 Airway Equipment and Method: Stylet and Oral airway Placement Confirmation: ETT inserted through vocal cords under direct vision,  positive ETCO2 and breath sounds checked- equal and bilateral Secured at: 22 cm Tube secured with: Tape Dental Injury: Teeth and Oropharynx as per pre-operative assessment

## 2019-11-27 NOTE — Anesthesia Postprocedure Evaluation (Signed)
Anesthesia Post Note  Patient: Kathryn Thomas  Procedure(s) Performed: ENDOSCOPIC RETROGRADE CHOLANGIOPANCREATOGRAPHY (ERCP) (N/A ) SPHINCTEROTOMY REMOVAL OF STONES     Patient location during evaluation: PACU Anesthesia Type: General Level of consciousness: awake and alert Pain management: pain level controlled Vital Signs Assessment: post-procedure vital signs reviewed and stable Respiratory status: spontaneous breathing, nonlabored ventilation, respiratory function stable and patient connected to nasal cannula oxygen Cardiovascular status: blood pressure returned to baseline and stable Postop Assessment: no apparent nausea or vomiting Anesthetic complications: no    Last Vitals:  Vitals:   11/27/19 1245 11/27/19 1300  BP: (!) 181/89 (!) 187/97  Pulse: (!) 57 (!) 59  Resp: 13 15  Temp:  36.9 C  SpO2: 93% 95%    Last Pain:  Vitals:   11/27/19 1300  TempSrc:   PainSc: 0-No pain                 [name] DAVID

## 2019-11-27 NOTE — Anesthesia Preprocedure Evaluation (Signed)
Anesthesia Evaluation  Patient identified by MRN, date of birth, ID band Patient awake    Reviewed: Allergy & Precautions, NPO status , Patient's Chart, lab work & pertinent test results  Airway Mallampati: I  TM Distance: >3 FB Neck ROM: Full    Dental   Pulmonary    Pulmonary exam normal        Cardiovascular hypertension, Pt. on medications Normal cardiovascular exam     Neuro/Psych Anxiety Depression    GI/Hepatic   Endo/Other    Renal/GU      Musculoskeletal   Abdominal   Peds  Hematology   Anesthesia Other Findings   Reproductive/Obstetrics                             Anesthesia Physical Anesthesia Plan  ASA: II  Anesthesia Plan: General   Post-op Pain Management:    Induction: Intravenous  PONV Risk Score and Plan: 3 and Ondansetron, Dexamethasone and Midazolam  Airway Management Planned: Oral ETT  Additional Equipment:   Intra-op Plan:   Post-operative Plan: Extubation in OR  Informed Consent: I have reviewed the patients History and Physical, chart, labs and discussed the procedure including the risks, benefits and alternatives for the proposed anesthesia with the patient or authorized representative who has indicated his/her understanding and acceptance.       Plan Discussed with: CRNA and Surgeon  Anesthesia Plan Comments:         Anesthesia Quick Evaluation

## 2019-11-27 NOTE — Transfer of Care (Signed)
Immediate Anesthesia Transfer of Care Note  Patient: Kathryn Thomas  Procedure(s) Performed: ENDOSCOPIC RETROGRADE CHOLANGIOPANCREATOGRAPHY (ERCP) (N/A ) SPHINCTEROTOMY REMOVAL OF STONES  Patient Location: PACU  Anesthesia Type:General  Level of Consciousness: awake, drowsy, patient cooperative and responds to stimulation  Airway & Oxygen Therapy: Patient Spontanous Breathing and Patient connected to face mask oxygen  Post-op Assessment: Report given to RN and Post -op Vital signs reviewed and stable  Post vital signs: Reviewed and stable  Last Vitals:  Vitals Value Taken Time  BP 124/84 11/27/19 1132  Temp    Pulse 75 11/27/19 1140  Resp 16 11/27/19 1140  SpO2 95 % 11/27/19 1140  Vitals shown include unvalidated device data.  Last Pain:  Vitals:   11/27/19 0925  TempSrc: Temporal  PainSc: 0-No pain      Patients Stated Pain Goal: 2 ([id number])  Complications: No apparent anesthesia complications

## 2019-11-27 NOTE — Op Note (Signed)
[facility] Patient Name: Kathryn Thomas Procedure Date: 11/27/2019 MRN: [id number] Attending MD: Ladene Artist , MD Date of Birth: [birth year] CSN: [id number] Age: 79 Admit Type: Inpatient Procedure:                ERCP Indications:              Filling defect on intraoperative cholangiogram,                            Abnormal IOC, Elevated bilirubin Providers:                Pricilla Riffle. Fuller Plan, MD, Burtis Junes, RN, Janeece Agee,                            Technician Referring MD:             Earnstine Regal Medicines:                General Anesthesia Complications:            No immediate complications. Estimated Blood Loss:     Estimated blood loss: none. Procedure:                Pre-Anesthesia Assessment:                           - Prior to the procedure, a History and Physical                            was performed, and patient medications and                            allergies were reviewed. The patient's tolerance of                            previous anesthesia was also reviewed. The risks                            and benefits of the procedure and the sedation                            options and risks were discussed with the patient.                            All questions were answered, and informed consent                            was obtained. Prior Anticoagulants: The patient has                            taken no previous anticoagulant or antiplatelet                            agents. ASA Grade Assessment: III - A patient with  severe systemic disease. After reviewing the risks                            and benefits, the patient was deemed in                            satisfactory condition to undergo the procedure.                           After obtaining informed consent, the scope was                            passed under direct vision. Throughout the                            procedure, the patient's blood  pressure, pulse, and                            oxygen saturations were monitored continuously. The                            Duodenoscope was introduced through the mouth, and                            used to inject contrast into and used to inject                            contrast into the bile duct. The ERCP was                            accomplished without difficulty. The patient                            tolerated the procedure well. Scope In: Scope Out: Findings:      A scout film of the abdomen was obtained. Surgical clips, consistent       with a previous cholecystectomy, were seen in the area of the right       upper quadrant of the abdomen. The esophagus was successfully intubated       under direct vision. The scope was advanced to a normal major papilla in       the descending duodenum without detailed examination of the pharynx,       larynx and associated structures, and upper GI tract. The upper GI tract       was grossly normal. After several attempts a straight Roadrunner wire       was passed into the biliary tree. The short-nosed traction       sphincterotome was passed over the guidewire and the bile duct was then       deeply cannulated. Contrast was injected. I personally interpreted the       bile duct images. There was appropriate flow of contrast through the       ducts. The common bile duct contained an amorphous filling defect       thought to be sludge. A cholecystectomy had been performed. The biliary  tree was otherwise normal. An 8 mm biliary sphincterotomy was made with       a traction (standard) sphincterotome using ERBE electrocautery. There       was no post-sphincterotomy bleeding. To discover objects, the biliary       tree was swept 3 times with a 9 mm balloon starting at the bifurcation.       Nothing was found. Suspected sludge passed during a time when we were       viewing fluoro images on the separate monitor located separately from        the endoscopy monitor. The main fluoro monitor was not functioning.       Excellent biliary drainage post sphincterotomy. The PD was not cannuated       or injected by intention. Impression:               - The examination was suspicious for CBD sludge.                           - Prior cholecystectomy.                           - A biliary sphincterotomy was performed.                           - The biliary tree was swept and nothing was found. Moderate Sedation:      Not Applicable - Patient had care per Anesthesia. Recommendation:           - Avoid aspirin and nonsteroidal anti-inflammatory                            medicines for 1 week post Indocin today.                           - Clear liquid diet for 2 hours, then advance as                            tolerated to soft diet today.                           - Observe patient's clinical course following                            today's ERCP with therapeutic intervention.                           - Trend LFTs. Procedure Code(s):        --- Professional ---                           [phone], Endoscopic retrograde                            cholangiopancreatography (ERCP); with                            sphincterotomy/papillotomy Diagnosis Code(s):        --- Professional ---  Z90.49, Acquired absence of other specified parts                            of digestive tract                           E80.7, Disorder of bilirubin metabolism, unspecified                           R93.2, Abnormal findings on diagnostic imaging of                            liver and biliary tract CPT copyright 2019 [employer]. All rights reserved. The codes documented in this report are preliminary and upon coder review may  be revised to meet current compliance requirements. Ladene Artist, MD 11/27/2019 11:14:22 AM This report has been signed electronically. Number of Addenda: 0

## 2019-11-27 NOTE — Progress Notes (Signed)
Progress Note  Patient Name: Kathryn Thomas Date of Encounter: 11/27/2019  Primary Cardiologist: Dr. Audie Box  Subjective   Didn't sleep well overnight. I spoke with the patient and then facetimed with the patient and her daughter Hassan Rowan for 25 minutes. She endorses first being told that she had skipped beats about 8 years ago. She saw Dr. Mare Ferrari in 2014 for chest pain, but she does not have any symptoms of her PVCs. She cannot recall any other workup.   She endorses chronic shortness of breath with exertion and fatigue. She states that monthly B12 injections improved this, but she has not been receiving them in the last year and can feel a difference.  No chest pain. Surgical pain well controlled. Pending ERCP.  Inpatient Medications    Scheduled Meds: . lip balm  1 application Topical BID   Continuous Infusions: . dextrose 5 % and 0.45 % NaCl with KCl 20 mEq/L 50 mL/hr at 11/26/19 1351  . lactated ringers    . methocarbamol (ROBAXIN) IV    . ondansetron (ZOFRAN) IV    . piperacillin-tazobactam (ZOSYN)  IV 3.375 g (11/27/19 0140)   PRN Meds: acetaminophen **OR** acetaminophen, ALPRAZolam, alum & mag hydroxide-simeth, bisacodyl, diphenhydrAMINE, diphenhydrAMINE, guaiFENesin-dextromethorphan, hydrocortisone, hydrocortisone cream, HYDROmorphone (DILAUDID) injection, lactated ringers, magic mouthwash, menthol-cetylpyridinium, methocarbamol (ROBAXIN) IV, ondansetron (ZOFRAN) IV **OR** ondansetron (ZOFRAN) IV, ondansetron **OR** [DISCONTINUED] ondansetron (ZOFRAN) IV, oxyCODONE, phenol, polyethylene glycol, prochlorperazine, traMADol   Vital Signs    Vitals:   11/26/19 1838 11/26/19 2048 11/27/19 0112 11/27/19 0500  BP: (!) 148/60 139/65 (!) 158/84 (!) 152/82  Pulse: 71 70 68 (!) 58  Resp: 18 18 16 18   Temp: 98.1 F (36.7 C) 98.2 F (36.8 C) 98.6 F (37 C) 98.7 F (37.1 C)  TempSrc: Oral Oral Oral Oral  SpO2: 91% 91%  99%  Weight:      Height:        Intake/Output  Summary (Last 24 hours) at 11/27/2019 0908 Last data filed at 11/26/2019 1800 Gross per 24 hour  Intake 1803.55 ml  Output 235 ml  Net 1568.55 ml   Last 3 Weights 11/25/2019 04/09/2019 11/28/2016  Weight (lbs) 244 lb 245 lb 246 lb  Weight (kg) 110.678 kg 111.131 kg 111.585 kg      Telemetry    Periods of normal sinus rhythm overnight, but largely sinus with frequent PVCs, especially bigeminy and trigeminy. No NSVT - Personally Reviewed  ECG    Sinus, ventricular bigeminy and PVC couplet - Personally Reviewed  Physical Exam   GEN: No acute distress.   Neck: No JVD Cardiac: regular trigeminy audibly, no murmurs, rubs, or gallops.  Respiratory: Clear to auscultation bilaterally. GI: Soft, tender to palpation diffusely though reports improvement MS: No edema; No deformity. Neuro:  Nonfocal  Psych: Normal affect   Labs    High Sensitivity Troponin:   Recent Labs  Lab 11/25/19 0846 11/25/19 1508  TROPONINIHS 15 13      Chemistry Recent Labs  Lab 11/25/19 0846 11/26/19 0539  NA 135 134*  K 3.3* 3.7  CL 95* 98  CO2 28 24  GLUCOSE 206* 129*  BUN 22 23  CREATININE 1.33* 1.29*  CALCIUM 8.9 8.5*  PROT 7.7 7.0  ALBUMIN 3.4* 2.9*  AST 20 21  ALT 15 16  ALKPHOS 77 86  BILITOT 2.3* 3.4*  GFRNONAA 38* 39*  GFRAA 44* 46*  ANIONGAP 12 12     Hematology Recent Labs  Lab 11/25/19 0846 11/26/19 0539  WBC 28.3* 19.3*  RBC 5.55* 5.24*  HGB 15.9* 15.0  HCT 48.4* 46.9*  MCV 87.2 89.5  MCH 28.6 28.6  MCHC 32.9 32.0  RDW 14.6 14.6  PLT 286 182    BNPNo results for input(s): BNP, PROBNP in the last 168 hours.   DDimer No results for input(s): DDIMER in the last 168 hours.   Radiology    DG Cholangiogram Operative  Addendum Date: 11/26/2019   ADDENDUM REPORT: 11/26/2019 10:12 ADDENDUM: The original report was by Dr. Sandi Mariscal. The following addendum is by Dr. Van Clines: I spoke with Dr. Harlow Asa by telephone while he was in the operating room regarding this  case. We discussed the likelihood that the filling defect in the common bile duct was likely a stone or possibly tumefactive sludge. This aligns relatively well with Dr. Pascal Lux' interpretation; unbeknownst to me, Dr. Pascal Lux was dictating this case while I was speaking to Dr. Harlow Asa about the images. Electronically Signed   By: Van Clines M.D.   On: 11/26/2019 10:12   Result Date: 11/26/2019 CLINICAL DATA:  Intraoperative cholangiogram during laparoscopic cholecystectomy. EXAM: INTRAOPERATIVE CHOLANGIOGRAM FLUOROSCOPY TIME:  1 minutes, 15 seconds COMPARISON:  CT abdomen pelvis-11/25/2019 FINDINGS: Intraoperative cholangiographic images of the right upper abdominal quadrant during laparoscopic cholecystectomy are provided for review. Surgical clips overlie the expected location of the gallbladder fossa. Contrast injection demonstrates selective cannulation of the central aspect of the cystic duct. There is passage of contrast through the central aspect of the cystic duct with filling of a non dilated common bile duct. There is a persistent near occlusive filling defect within the mid/distal aspect of the CBD worrisome for choledocholithiasis. Eventually, there is passage of contrast though the CBD and into the descending portion of the duodenum. There is minimal reflux of injected contrast into the common hepatic duct and central aspect of the non dilated intrahepatic biliary system. IMPRESSION: Findings worrisome for near occlusive choledocholithiasis. Electronically Signed: By: Sandi Mariscal M.D. On: 11/26/2019 10:03   CT Abdomen Pelvis W Contrast  Result Date: 11/25/2019 CLINICAL DATA:  Abdominal abscess or infection. EXAM: CT ABDOMEN AND PELVIS WITH CONTRAST TECHNIQUE: Multidetector CT imaging of the abdomen and pelvis was performed using the standard protocol following bolus administration of intravenous contrast. CONTRAST:  80 mL OMNIPAQUE IOHEXOL 300 MG/ML  SOLN COMPARISON:  June 15, 2013.  FINDINGS: Lower chest: No acute abnormality. Hepatobiliary: There is a large gallstone noted in the neck of the gallbladder. The gallbladder is dilated with thickened wall and surrounding inflammatory change consistent with acute cholecystitis. No biliary dilatation is noted. No focal abnormality is noted in the liver. Pancreas: Unremarkable. No pancreatic ductal dilatation or surrounding inflammatory changes. Spleen: Normal in size without focal abnormality. Adrenals/Urinary Tract: Adrenal glands are unremarkable. Kidneys are normal, without renal calculi, focal lesion, or hydronephrosis. Bladder is unremarkable. Stomach/Bowel: Stomach is within normal limits. Appendix appears normal. No evidence of bowel wall thickening, distention, or inflammatory changes. Vascular/Lymphatic: Aortic atherosclerosis. No enlarged abdominal or pelvic lymph nodes. Reproductive: Status post hysterectomy. No adnexal masses. Other: No abdominal wall hernia or abnormality. No abdominopelvic ascites. Musculoskeletal: No acute or significant osseous findings. IMPRESSION: Findings consistent with acute cholecystitis. Large gallstone is noted in the neck of the gallbladder. No biliary dilatation is noted. Aortic Atherosclerosis (ICD10-I70.0). Electronically Signed   By: Marijo Conception M.D.   On: 11/25/2019 10:14    Cardiac Studies   MPI 2014 normal  Patient Profile     79 y.o. female with  PMH hypertension, obesity who is being followed for shortness of breath and perioperative risk in the setting of acute cholecystitis requiring cholecystectomy. Found to be in ventricular bigeminy on admission in the setting of acute illness and hypokalemia/hypomagnesemia.  Assessment & Plan    Shortness of breath, fatigue with exertion: chronic for her, reports improvement with prior B12 injection.  -would recommend an echo and event monitor as an outpatient to determine PVC burden and assess for structural changes.  Ventricular bigeminy:  she endorses >8 year history of skipped beats. Asymptomatic -while admitted, would keep K>4, Mg >2 -K today 3.7, Mg 2.2. She is receiving potassium in her IV fluids. -as above, recommend outpatient event monitor and echocardiogram when she is back at her baseline post surgery  [facility] will sign off.   Medication Recommendations:  No change Other recommendations (labs, testing, etc):  None inpatient Follow up as an outpatient:  I will arrange for her to see Dr. Audie Box 3-4 weeks after discharge, ideally when she is closer to a full recovery, for outpatient evaluation.  For questions or updates, please contact [facility] Please consult [email or web address] for contact info under    Total time of encounter: 30 minutes total time of encounter, including 25 minutes spent in face-to-face patient care, including collateral history with face time with daughter Hassan Rowan. Remainder of non-face-to-face time involved reviewing chart documents/testing relevant to the patient encounter and documentation in the medical record.  Buford Dresser, MD, PhD [facility]     Signed, Buford Dresser, MD  11/27/2019, 9:08 AM

## 2019-11-27 NOTE — Interval H&P Note (Signed)
History and Physical Interval Note:  11/27/2019 9:40 AM  Kathryn Thomas  has presented today for surgery, with the diagnosis of Abnormal IOC.  The various methods of treatment have been discussed with the patient and family. After consideration of risks, benefits and other options for treatment, the patient has consented to  Procedure(s): ENDOSCOPIC RETROGRADE CHOLANGIOPANCREATOGRAPHY (ERCP) (N/A) as a surgical intervention.  The patient's history has been reviewed, patient examined, no change in status, stable for surgery.  I have reviewed the patient's chart and labs.  Questions were answered to the patient's satisfaction.     Pricilla Riffle. Fuller Plan

## 2019-11-28 DIAGNOSIS — K5900 Constipation, unspecified: Secondary | ICD-10-CM

## 2019-11-28 LAB — CBC
HCT: 38.8 % (ref 36.0–46.0)
Hemoglobin: 11.9 g/dL — ABNORMAL LOW (ref 12.0–15.0)
MCH: 28.1 pg (ref 26.0–34.0)
MCHC: 30.7 g/dL (ref 30.0–36.0)
MCV: 91.5 fL (ref 80.0–100.0)
Platelets: 240 10*3/uL (ref 150–400)
RBC: 4.24 MIL/uL (ref 3.87–5.11)
RDW: 14.4 % (ref 11.5–15.5)
WBC: 17 10*3/uL — ABNORMAL HIGH (ref 4.0–10.5)
nRBC: 0 % (ref 0.0–0.2)

## 2019-11-28 LAB — COMPREHENSIVE METABOLIC PANEL
ALT: 32 U/L (ref 0–44)
AST: 26 U/L (ref 15–41)
Albumin: 2.8 g/dL — ABNORMAL LOW (ref 3.5–5.0)
Alkaline Phosphatase: 89 U/L (ref 38–126)
Anion gap: 8 (ref 5–15)
BUN: 47 mg/dL — ABNORMAL HIGH (ref 8–23)
CO2: 26 mmol/L (ref 22–32)
Calcium: 8.5 mg/dL — ABNORMAL LOW (ref 8.9–10.3)
Chloride: 101 mmol/L (ref 98–111)
Creatinine, Ser: 1.73 mg/dL — ABNORMAL HIGH (ref 0.44–1.00)
GFR calc Af Amer: 32 mL/min — ABNORMAL LOW (ref >60)
GFR calc non Af Amer: 28 mL/min — ABNORMAL LOW (ref >60)
Glucose, Bld: 203 mg/dL — ABNORMAL HIGH (ref 70–99)
Potassium: 4.3 mmol/L (ref 3.5–5.1)
Sodium: 135 mmol/L (ref 135–145)
Total Bilirubin: 0.7 mg/dL (ref 0.3–1.2)
Total Protein: 6.6 g/dL (ref 6.5–8.1)

## 2019-11-28 MED ORDER — POLYETHYLENE GLYCOL 3350 17 G PO PACK
17.0000 g | PACK | Freq: Two times a day (BID) | ORAL | Status: DC
  Administered 2019-11-28 (×2): 17 g via ORAL
  Filled 2019-11-28 (×3): qty 1

## 2019-11-28 NOTE — Progress Notes (Signed)
    Progress Note   Subjective  Constipation, gas, no BM in 7 days. Upper abdominal soreness   Objective  Vital signs in last 24 hours: Temp:  [97.7 F (36.5 C)-98.7 F (37.1 C)] 98 F (36.7 C) (03/07 0541) Pulse Rate:  [57-80] 64 (03/07 0541) Resp:  [13-23] 15 (03/07 0541) BP: (116-187)/(56-131) 144/56 (03/07 0541) SpO2:  [82 %-100 %] 99 % (03/07 0541) Last BM Date: 11/22/19  General: Alert, well-developed, in NAD Heart:  Regular rate and rhythm; no murmurs Chest: Clear to ascultation bilaterally Abdomen:  Soft, mild upper abdominal tenderenss and nondistended. Normal bowel sounds, without guarding, and without rebound.   Extremities:  Without edema. Neurologic:  Alert and  oriented x4; grossly normal neurologically. Psych:  Alert and cooperative. Normal mood and affect.  Intake/Output from previous day: 03/06 0701 - 03/07 0700 In: 2295.8 [P.O.:600; I.V.:1300.5; IV Piggyback:395.3] Out: 0  Intake/Output this shift: No intake/output data recorded.  Lab Results: Recent Labs    11/26/19 0539 11/28/19 0510  WBC 19.3* 17.0*  HGB 15.0 11.9*  HCT 46.9* 38.8  PLT 182 240   BMET Recent Labs    11/26/19 0539 11/28/19 0510  NA 134* 135  K 3.7 4.3  CL 98 101  CO2 24 26  GLUCOSE 129* 203*  BUN 23 47*  CREATININE 1.29* 1.73*  CALCIUM 8.5* 8.5*   LFT Recent Labs    11/28/19 0510  PROT 6.6  ALBUMIN 2.8*  AST 26  ALT 32  ALKPHOS 89  BILITOT 0.7    Studies/Results: DG ERCP BILIARY & PANCREATIC DUCTS  Result Date: 11/27/2019 CLINICAL DATA:  Common bile duct stone. EXAM: ERCP TECHNIQUE: Multiple spot images obtained with the fluoroscopic device and submitted for interpretation post-procedure. COMPARISON:  CT abdomen pelvis-11/25/2019; intraoperative cholangiogram during laparoscopic cholecystectomy-11/26/2019 FLUOROSCOPY TIME:  3 minutes, 39 seconds (108 mGy) FINDINGS: Seven spot intraoperative fluoroscopic images of the right upper abdominal quadrant during ERCP  are provided for review Initial image demonstrates an ERCP probe overlying the right upper abdominal quadrant. Surgical clips overlies expected location of the gallbladder fossa. Subsequent images demonstrate selective cannulation opacification of the CBD which appears nondilated. Subsequent images demonstrate insufflation of a balloon within distal aspect of the CBD with subsequent sweeping and biliary sphincterotomy. There is minimal opacification of the central aspect of the biliary tree which appears nondilated. There is faint transient opacification of the residual cystic duct without definitive contrast extravasation. There is no definitive opacification of the pancreatic duct. IMPRESSION: ERCP with biliary sweeping and presumed sphincterotomy as above. These images were submitted for radiologic interpretation only. Please see the procedural report for the amount of contrast and the fluoroscopy time utilized. Electronically Signed   By: Sandi Mariscal M.D.   On: 11/27/2019 11:40      Assessment & Recommendations  1. S/P lap chole for acute cholecystitis. Per [facility].  2. Abnormal IOC, biliary sludge. S/P ERCP sphincterotomy. LFTs including t bili are normal today.  3. AKI. BUN/Cr elevated today. Continue IVF. Per [facility].   4. Constipation. History of anal stenosis. Add Miralax bid. Elective outpatient colonoscopy a few weeks after recovery from lap chole. Anal stenosis may require follow up with [facility].   GI signing off, available if needed.    LOS: 3 days   Norberto Sorenson T. Fuller Plan MD  11/28/2019, 11:42 AM

## 2019-11-29 ENCOUNTER — Inpatient Hospital Stay (HOSPITAL_COMMUNITY): Payer: Medicare HMO

## 2019-11-29 ENCOUNTER — Encounter: Payer: Self-pay | Admitting: *Deleted

## 2019-11-29 LAB — COMPREHENSIVE METABOLIC PANEL
ALT: 29 U/L (ref 0–44)
AST: 22 U/L (ref 15–41)
Albumin: 2.7 g/dL — ABNORMAL LOW (ref 3.5–5.0)
Alkaline Phosphatase: 91 U/L (ref 38–126)
Anion gap: 8 (ref 5–15)
BUN: 34 mg/dL — ABNORMAL HIGH (ref 8–23)
CO2: 27 mmol/L (ref 22–32)
Calcium: 8.3 mg/dL — ABNORMAL LOW (ref 8.9–10.3)
Chloride: 104 mmol/L (ref 98–111)
Creatinine, Ser: 1.13 mg/dL — ABNORMAL HIGH (ref 0.44–1.00)
GFR calc Af Amer: 54 mL/min — ABNORMAL LOW (ref >60)
GFR calc non Af Amer: 46 mL/min — ABNORMAL LOW (ref >60)
Glucose, Bld: 120 mg/dL — ABNORMAL HIGH (ref 70–99)
Potassium: 4.1 mmol/L (ref 3.5–5.1)
Sodium: 139 mmol/L (ref 135–145)
Total Bilirubin: 1.2 mg/dL (ref 0.3–1.2)
Total Protein: 6.7 g/dL (ref 6.5–8.1)

## 2019-11-29 LAB — CBC
HCT: 40.7 % (ref 36.0–46.0)
Hemoglobin: 12.5 g/dL (ref 12.0–15.0)
MCH: 28.1 pg (ref 26.0–34.0)
MCHC: 30.7 g/dL (ref 30.0–36.0)
MCV: 91.5 fL (ref 80.0–100.0)
Platelets: 245 10*3/uL (ref 150–400)
RBC: 4.45 MIL/uL (ref 3.87–5.11)
RDW: 14.5 % (ref 11.5–15.5)
WBC: 9.5 10*3/uL (ref 4.0–10.5)
nRBC: 0 % (ref 0.0–0.2)

## 2019-11-29 LAB — URIC ACID: Uric Acid, Serum: 5.1 mg/dL (ref 2.5–7.1)

## 2019-11-29 LAB — SURGICAL PATHOLOGY

## 2019-11-29 MED ORDER — TRIAMCINOLONE ACETONIDE 40 MG/ML IJ SUSP
40.0000 mg | Freq: Once | INTRAMUSCULAR | Status: DC
  Filled 2019-11-29: qty 1

## 2019-11-29 MED ORDER — POLYETHYLENE GLYCOL 3350 17 G PO PACK
17.0000 g | PACK | Freq: Every day | ORAL | Status: DC
  Administered 2019-11-29 – 2019-12-01 (×3): 17 g via ORAL
  Filled 2019-11-29 (×2): qty 1

## 2019-11-29 MED ORDER — INDOMETHACIN 25 MG PO CAPS
50.0000 mg | ORAL_CAPSULE | Freq: Three times a day (TID) | ORAL | Status: DC
  Administered 2019-11-30 – 2019-12-01 (×5): 50 mg via ORAL
  Filled 2019-11-29 (×6): qty 2

## 2019-11-29 MED ORDER — ACETAMINOPHEN 325 MG PO TABS: ORAL_TABLET | ORAL | Status: AC

## 2019-11-29 MED ORDER — OXYCODONE HCL 5 MG PO TABS: 5.0000 mg | ORAL_TABLET | ORAL | 0 refills | Status: DC | PRN

## 2019-11-29 MED ORDER — CELECOXIB 200 MG PO CAPS: ORAL_CAPSULE | ORAL | Status: DC

## 2019-11-29 MED ORDER — DOCUSATE SODIUM 100 MG PO CAPS
200.0000 mg | ORAL_CAPSULE | Freq: Every day | ORAL | Status: DC
  Administered 2019-11-29 – 2019-12-01 (×3): 200 mg via ORAL
  Filled 2019-11-29 (×3): qty 2

## 2019-11-29 MED ORDER — FLEET ENEMA 7-19 GM/118ML RE ENEM
1.0000 | ENEMA | Freq: Once | RECTAL | Status: AC
  Administered 2019-11-29: 1 via RECTAL
  Filled 2019-11-29: qty 1

## 2019-11-29 MED ORDER — LIDOCAINE HCL 1 % IJ SOLN
10.0000 mL | Freq: Once | INTRAMUSCULAR | Status: DC
  Filled 2019-11-29: qty 10

## 2019-11-29 NOTE — Evaluation (Signed)
Physical Therapy Evaluation Patient Details Name: Kathryn Thomas MRN: [id number] DOB: 12-Mar-1941 Today's Date: 11/29/2019   History of Present Illness  Patient is a 79 year old female who presented to [facility] with complaints of vomiting since Monday evening 11/22/2019.  She also reports receiving her Covid vaccine on Sunday, 11/21/2019. She reported abdominal pain, chills, sweats starting Sunday.  She has had ongoing abdominal pain, nausea and vomiting.  She was initially seen in Urgent care and referred to the [facility] ED.  S/P lap chole and ERCP  , CT scan consistent with acute cholecystitis. Onset of  right knee pain, medially.  Clinical Impression  The patient  Reports  Up ad lib to BR. Patient is noted to hole onto bed, walls, sink to get to BR. Encouraged patient to use RW. Patient ambulated x 100' with Rw with a smoother and safer gait. Not noted  Right knee buckling. Patient does have point tenderness medially on right knee, reports as a deep pain when palpated. . Patient bearing weight without pain, just to touch. Pt admitted with above diagnosis. Pt currently with functional limitations due to the deficits listed below (see PT Problem List). Pt will benefit from skilled PT to increase their independence and safety with mobility to allow discharge to the venue listed below.  Orhto consult is pending for the right  kee.   Follow Up Recommendations No PT follow up    Equipment Recommendations  None recommended by PT    Recommendations for Other Services       Precautions / Restrictions Precautions Precautions: Fall      Mobility  Bed Mobility Overal bed mobility: Needs Assistance       Supine to sit: Independent Sit to supine: Independent   General bed mobility comments: some struggle to get up due to abdominal pain  Transfers   Equipment used: None;Rolling walker (2 wheeled)                Ambulation/Gait Ambulation/Gait assistance:  Modified independent (Device/Increase time);Supervision   Assistive device: Rolling walker (2 wheeled);None Gait Pattern/deviations: Step-to pattern;Step-through pattern     General Gait Details: in room, patient cruising on furniture, doors. with RW gait  is smoother.  Stairs            Wheelchair Mobility    Modified Rankin (Stroke Patients Only)       Balance Overall balance assessment: Needs assistance         Standing balance support: No upper extremity supported Standing balance-Leahy Scale: Fair                               Pertinent Vitals/Pain Pain Assessment: Faces Pain Score: 8  Pain Location: medial right knee wihen touched, abdomen 4 Pain Descriptors / Indicators: Discomfort;Sharp;Shooting Pain Intervention(s): Monitored during session;Limited activity within patient's tolerance;Ice applied    Home Living Family/patient expects to be discharged to:: Private residence Living Arrangements: Alone Available Help at Discharge: Available PRN/intermittently;Family Type of Home: House Home Access: Stairs to enter Entrance Stairs-Rails: Psychiatric nurse of Steps: 4 Home Layout: Two level;Able to live on main level with bedroom/bathroom Home Equipment: Gilford Rile - 2 wheels      Prior Function Level of Independence: Independent               Hand Dominance        Extremity/Trunk Assessment   Upper Extremity Assessment Upper Extremity  Assessment: Overall WFL for tasks assessed    Lower Extremity Assessment Lower Extremity Assessment: RLE deficits/detail RLE Deficits / Details: lacks knee extension, pain limiting, knee flexion  at least 110    Cervical / Trunk Assessment Cervical / Trunk Assessment: Normal  Communication   Communication: No difficulties  Cognition Arousal/Alertness: Awake/alert Behavior During Therapy: WFL for tasks assessed/performed Overall Cognitive Status: Within Functional Limits for  tasks assessed                                        General Comments      Exercises     Assessment/Plan    PT Assessment Patient needs continued PT services  PT Problem List Decreased strength;Decreased mobility;Decreased safety awareness;Decreased activity tolerance;Pain       PT Treatment Interventions DME instruction;Therapeutic activities;Patient/family education;Functional mobility training    PT Goals (Current goals can be found in the Care Plan section)  Acute Rehab PT Goals Patient Stated Goal: to go home PT Goal Formulation: With patient Time For Goal Achievement: 12/13/19 Potential to Achieve Goals: Good    Frequency Min 3X/week   Barriers to discharge        Co-evaluation               AM-PAC PT "6 Clicks" Mobility  Outcome Measure Help needed turning from your back to your side while in a flat bed without using bedrails?: None Help needed moving from lying on your back to sitting on the side of a flat bed without using bedrails?: None Help needed moving to and from a bed to a chair (including a wheelchair)?: None Help needed standing up from a chair using your arms (e.g., wheelchair or bedside chair)?: None Help needed to walk in hospital room?: A Little Help needed climbing 3-5 steps with a railing? : A Little 6 Click Score: 22    End of Session   Activity Tolerance: Patient tolerated treatment well Patient left: in bed;with call bell/phone within reach Nurse Communication: Mobility status PT Visit Diagnosis: Unsteadiness on feet (R26.81);Difficulty in walking, not elsewhere classified (R26.2);Pain Pain - Right/Left: Right Pain - part of body: Knee    Time: 1559-1640 PT Time Calculation (min) (ACUTE ONLY): 41 min   Charges:   PT Evaluation $PT Eval Low Complexity: 1 Low PT Treatments $Gait Training: 8-22 mins $Self Care/Home Management: [place] Pager  [phone] Office [phone]   Claretha Cooper 11/29/2019, 5:02 PM

## 2019-11-29 NOTE — Care Management Important Message (Signed)
Important Message  Patient Details IM Letter given to Dessa Phi RN Case Manager to present to the Patient Name: Kathryn Thomas MRN: [id number] Date of Birth: March 29, 1941   Medicare Important Message Given:  Yes     Kerin Salen 11/29/2019, 12:08 PM

## 2019-11-29 NOTE — Consult Note (Signed)
Reason for Consult: Right knee pain Referring Physician: Modena Jansky, PA-C  Kathryn Thomas is an 79 y.o. female.  HPI: The patient is a 79 year old female with a past medical history significant for cholecystitis.  She was in her usual state of good health until this past weekend when she started having fever, chills and abdominal pain.  She was admitted to the hospital and has since undergone cholecystectomy and ERCP.  She has done well since surgery but but began having pain in her right knee earlier today.  She denies any history of injury or surgery to that knee.  She had a similar episode several months ago requiring a trip to the emergency room.  This resolved spontaneously.  She points to the medial aspect the knee and says that it is quite tender.  It does not really hurt when she bears weight.  She denies any significant swelling.  She is able to move around in bed without any significant knee pain.  She denies any history of gout.  Past Medical History:  Diagnosis Date  . Acute renal failure ([place])   . Anal stenosis 2010  . Anxiety   . Blood transfusion    " no reaction to transfusion "  . Depression   . External hemorrhoids   . Gastric polyp   . Gastritis   . Hypertension   . Intestinal metaplasia of gastric mucosa 09/2011   high grade dysplasia  . Pernicious anemia   . Shortness of breath   . Vitamin B12 deficiency     Past Surgical History:  Procedure Laterality Date  . ABDOMINAL HYSTERECTOMY    . BACK SURGERY  1980   three lower spine surgeries.  . CHOLECYSTECTOMY N/A 11/26/2019   Procedure: LAPAROSCOPIC CHOLECYSTECTOMY WITH INTRAOPERATIVE CHOLANGIOGRAM;  Surgeon: Armandina Gemma, MD;  Location: [facility] ORS;  Service: General;  Laterality: N/A;  . ERCP N/A 11/27/2019   Procedure: ENDOSCOPIC RETROGRADE CHOLANGIOPANCREATOGRAPHY (ERCP);  Surgeon: Ladene Artist, MD;  Location: [facility] END[facility];  Service: Endoscopy;  Laterality: N/A;  . ESOPHAGOGASTRODUODENOSCOPY  10/01/2011   Procedure: ESOPHAGOGASTRODUODENOSCOPY (EGD);  Surgeon: Lafayette Dragon, MD;  Location: [facility] ENDOSCOPY;  Service: Endoscopy;  Laterality: N/A;  . REMOVAL OF STONES  11/27/2019   Procedure: REMOVAL OF STONES;  Surgeon: Ladene Artist, MD;  Location: [facility] ENDOSCOPY;  Service: Endoscopy;;  . Joan Mayans  11/27/2019   Procedure: SPHINCTEROTOMY;  Surgeon: Ladene Artist, MD;  Location: [facility] ENDOSCOPY;  Service: Endoscopy;;    Family History  Problem Relation Age of Onset  . Cervical cancer Mother   . Heart disease Mother        died of CHF  . Heart disease Father        died at 75 of heart attack  . Multiple myeloma Sister        spleen involvement  . Cancer Sister        bone marrow  . Stroke Sister   . Colon cancer Neg Hx     Social History:  reports that she has never smoked. She has never used smokeless tobacco. She reports that she does not drink alcohol or use drugs.  Allergies: No Known Allergies  Medications: I have reviewed the patient's current medications.  Results for orders placed or performed during the hospital encounter of 11/25/19 (from the past 48 hour(s))  CBC Once     Status: Abnormal   Collection Time: 11/28/19  5:10 AM  Result Value Ref Range   WBC 17.0 (H) 4.0 -  10.5 K/uL   RBC 4.24 3.87 - 5.11 MIL/uL   Hemoglobin 11.9 (L) 12.0 - 15.0 g/dL   HCT 38.8 36.0 - 46.0 %   MCV 91.5 80.0 - 100.0 fL   MCH 28.1 26.0 - 34.0 pg   MCHC 30.7 30.0 - 36.0 g/dL   RDW 14.4 11.5 - 15.5 %   Platelets 240 150 - 400 K/uL   nRBC 0.0 0.0 - 0.2 %    Comment: Performed at [facility], [place] [address] [place], [place] [address]  Comprehensive metabolic panel Once     Status: Abnormal   Collection Time: 11/28/19  5:10 AM  Result Value Ref Range   Sodium 135 135 - 145 mmol/L   Potassium 4.3 3.5 - 5.1 mmol/L   Chloride 101 98 - 111 mmol/L   CO2 26 22 - 32 mmol/L   Glucose, Bld 203 (H) 70 - 99 mg/dL    Comment: Glucose reference range applies only to samples taken  after fasting for at least 8 hours.   BUN 47 (H) 8 - 23 mg/dL   Creatinine, Ser 1.73 (H) 0.44 - 1.00 mg/dL   Calcium 8.5 (L) 8.9 - 10.3 mg/dL   Total Protein 6.6 6.5 - 8.1 g/dL   Albumin 2.8 (L) 3.5 - 5.0 g/dL   AST 26 15 - 41 U/L   ALT 32 0 - 44 U/L   Alkaline Phosphatase 89 38 - 126 U/L   Total Bilirubin 0.7 0.3 - 1.2 mg/dL   GFR calc non Af Amer 28 (L) >60 mL/min   GFR calc Af Amer 32 (L) >60 mL/min   Anion gap 8 5 - 15    Comment: Performed at [facility], [place] [address] [place], [place] [address]  CBC     Status: None   Collection Time: 11/29/19  9:32 AM  Result Value Ref Range   WBC 9.5 4.0 - 10.5 K/uL   RBC 4.45 3.87 - 5.11 MIL/uL   Hemoglobin 12.5 12.0 - 15.0 g/dL   HCT 40.7 36.0 - 46.0 %   MCV 91.5 80.0 - 100.0 fL   MCH 28.1 26.0 - 34.0 pg   MCHC 30.7 30.0 - 36.0 g/dL   RDW 14.5 11.5 - 15.5 %   Platelets 245 150 - 400 K/uL   nRBC 0.0 0.0 - 0.2 %    Comment: Performed at [facility], [place] [address], [place], [place] [address]  Comprehensive metabolic panel     Status: Abnormal   Collection Time: 11/29/19  9:32 AM  Result Value Ref Range   Sodium 139 135 - 145 mmol/L   Potassium 4.1 3.5 - 5.1 mmol/L   Chloride 104 98 - 111 mmol/L   CO2 27 22 - 32 mmol/L   Glucose, Bld 120 (H) 70 - 99 mg/dL    Comment: Glucose reference range applies only to samples taken after fasting for at least 8 hours.   BUN 34 (H) 8 - 23 mg/dL   Creatinine, Ser 1.13 (H) 0.44 - 1.00 mg/dL   Calcium 8.3 (L) 8.9 - 10.3 mg/dL   Total Protein 6.7 6.5 - 8.1 g/dL   Albumin 2.7 (L) 3.5 - 5.0 g/dL   AST 22 15 - 41 U/L   ALT 29 0 - 44 U/L   Alkaline Phosphatase 91 38 - 126 U/L   Total Bilirubin 1.2 0.3 - 1.2 mg/dL   GFR calc non Af Amer 46 (L) >60 mL/min   GFR  calc Af Amer 54 (L) >60 mL/min   Anion gap 8 5 - 15    Comment: Performed at [facility], [place] [address], [place], [place] [address]  Uric acid     Status: None   Collection Time:  11/29/19  9:40 AM  Result Value Ref Range   Uric Acid, Serum 5.1 2.5 - 7.1 mg/dL    Comment: Performed at [facility], [place] [address] [place], [place] [address]    DG Chest 2 View  Result Date: 11/29/2019 CLINICAL DATA:  Elevated white blood cell count. Patient status post ERCP and sphincterotomy 11/27/2019 and cholecystectomy 11/26/2019 EXAM: CHEST - 2 VIEW COMPARISON:  Single-view of the chest 04/09/2019. FINDINGS: The lungs are clear. Heart size is normal. Aortic atherosclerosis. No pneumothorax or pleural fluid. No acute or focal bony abnormality. IMPRESSION: No acute disease. Atherosclerosis. Electronically Signed   By: Inge Rise M.D.   On: 11/29/2019 10:47   DG Knee 1-2 Views Right  Result Date: 11/29/2019 CLINICAL DATA:  Pain and swelling EXAM: RIGHT KNEE - 1-2 VIEW COMPARISON:  None. FINDINGS: Alignment is anatomic. There is no acute fracture. A joint effusion is present. Degenerative changes are present primary in the lateral compartment. IMPRESSION: Mild degenerative changes primarily in the lateral compartment. A joint effusion is present. Electronically Signed   By: Macy Mis M.D.   On: 11/29/2019 14:49    ROS: No recent fever, chills, nausea, vomiting or changes in her appetite PE:  Blood pressure 104/83, pulse 70, temperature 98.4 F (36.9 C), temperature source Oral, resp. rate 15, height '5\' 7"'  (1.702 m), weight 110.7 kg, SpO2 97 %. Well-nourished well-developed woman seen in her hospital room sitting comfortably on the bed eating dinner.  The right knee has no gross deformity.  No significant swelling or effusion.  No erythema or significant warmth.  She is tender to palpation at the medial joint line.  Range of motion is 0 205 degrees.  No lymphadenopathy or lymphangitis.  5 out of 5 strength at the quad and hamstring.  Palpable pulses in the foot.  Sensibility to light touch is normal throughout the right lower extremity.  No tenderness at the left  knee.  Assessment/Plan: Based on the patient's history of acute illness and admission, I believe gout is the most likely cause of her acute knee pain.  Her radiographs show a very slight amount of lateral compartment arthritis.  Today I explained the nature of gout to her in detail.  I offered her a steroid injection after aspiration of her knee.  She declines.  I believe it safe for her to bear weight as tolerated.  She has already worked with therapy.  She can certainly continue working with therapy without restriction.  I will check on her tomorrow afternoon.  She would benefit from oral NSAIDs.  I see in her med list where she has already taken indomethacin.  I will put her on oral indomethacin now pending review by the surgery team in the morning.  Wylene Simmer 11/29/2019, 7:59 PM

## 2019-11-29 NOTE — Progress Notes (Signed)
2 Days Post-Op    CC: Abdominal pain  Subjective: Patient sitting up she has a sausage, bacon, eggs and potatoes on her plate.  She is complaining of her right knee hurting.  She had a small bowel movement yesterday with MiraLAX and a Dulcolax suppository.  She normally does not have issues but had constipation prior to coming in and yesterday was her first bowel movement in 5 days.  Dr. Fuller Plan also noted she has a history of anal stenosis and may need` need a colonoscopy at a later point.  Her port sites all look good.  Objective: Vital signs in last 24 hours: Temp:  [98.1 F (36.7 C)-98.2 F (36.8 C)] 98.1 F (36.7 C) (03/08 0635) Pulse Rate:  [63-68] 64 (03/08 0635) Resp:  [18-20] 20 (03/08 0635) BP: (133-150)/(86-98) 150/86 (03/08 0635) SpO2:  [96 %-97 %] 96 % (03/08 0635) Last BM Date: 11/22/19 360 p.o. 460 IV Nothing else recorded in the intake/output Afebrile vital signs are stable, BP is up slightly Labs: Shows glucose 203, creatinine 1.73, calcium 8.5 WBC 19.3 >> 17.0 Creatinine 1.29 >> 1.73 CMP Latest Ref Rng & Units 11/28/2019 11/26/2019 11/25/2019  Glucose 70 - 99 mg/dL 203(H) 129(H) 206(H)  Creatinine 0.44 - 1.00 mg/dL 1.73(H) 1.29(H) 1.33(H)  Total Bilirubin 0.3 - 1.2 mg/dL 0.7 3.4(H) 2.3(H)  Alkaline Phos 38 - 126 U/L 89 86 77  AST 15 - 41 U/L 26 21 20   ALT 0 - 44 U/L 32 16 15   Intake/Output from previous day: 03/07 0701 - 03/08 0700 In: 794.6 [P.O.:360; I.V.:331.7; IV Piggyback:102.9] Out: -  Intake/Output this shift: No intake/output data recorded.  General appearance: alert, cooperative and no distress Resp: clear to auscultation bilaterally Cardio: Regular rate with extra beats.  Still has some trigeminy on her telem. GI: Soft, sore, still complaining of constipation Extremities: Complain of pain in the right knee.  Otherwise normal.  Lab Results:  Recent Labs    11/28/19 0510  WBC 17.0*  HGB 11.9*  HCT 38.8  PLT 240    BMET Recent Labs   11/28/19 0510  NA 135  K 4.3  CL 101  CO2 26  GLUCOSE 203*  BUN 47*  CREATININE 1.73*  CALCIUM 8.5*   PT/INR No results for input(s): LABPROT, INR in the last 72 hours.  Recent Labs  Lab 11/25/19 0846 11/26/19 0539 11/28/19 0510  AST 20 21 26   ALT 15 16 32  ALKPHOS 77 86 89  BILITOT 2.3* 3.4* 0.7  PROT 7.7 7.0 6.6  ALBUMIN 3.4* 2.9* 2.8*     Lipase     Component Value Date/Time   LIPASE 18 11/25/2019 0846     Medications: . indomethacin  100 mg Rectal Once  . lip balm  1 application Topical BID  . polyethylene glycol  17 g Oral BID   . dextrose 5 % and 0.45 % NaCl with KCl 20 mEq/L 50 mL/hr at 11/29/19 0133  . methocarbamol (ROBAXIN) IV    . ondansetron (ZOFRAN) IV    . piperacillin-tazobactam (ZOSYN)  IV 3.375 g (11/29/19 0124)    Assessment/Plan Abnormal EKG/DOE Hypertension Hx of B12 anemia Hx gastric polyps Hypertension Anxiety  Chronic back pain BMI 38 Constipation/ hx anal stenosis AKI creatinine 0.98(04/09/19) >> 1.29 >> 1.73    Acute cholecystitis/cholelithiasis Laparoscopic cholecystectomy with intraoperative cholangiogram 11/26/2019, Dr. Armandina Gemma, POD #3 ERCP/sphincterotomy 11/27/2019, Dr. Lucio Edward, POD #2  FEN:   Soft diet/IV fluids ID: Zosyn 3/4 >> day 5 DVT:  SCD's  Follow up:  TBD  Plan: I get a recheck her labs.  We will give her a fleets enema.  My biggest concern can currently is her WBC still elevated, and her creatinine is going up.  We will add a uric acid to labs and get a CXR.      LOS: 4 days    Manpreet Kemmer 11/29/2019 Please see Amion

## 2019-11-29 NOTE — Progress Notes (Signed)
No BM with enema, continue Miralax,  Still complaining of right knee pain and very tender to touch.  Uric Acid is 5.1.  Checking films, and have ask Dr. Doran Durand, orthopedics to see.  Pt reports she does not have an orthopedic surgeon.    CBC Latest Ref Rng & Units 11/29/2019 11/28/2019 11/26/2019  WBC 4.0 - 10.5 K/uL 9.5 17.0(H) 19.3(H)  Hemoglobin 12.0 - 15.0 g/dL 12.5 11.9(L) 15.0  Hematocrit 36.0 - 46.0 % 40.7 38.8 46.9(H)  Platelets 150 - 400 K/uL 245 240 182   CMP Latest Ref Rng & Units 11/29/2019 11/28/2019 11/26/2019  Glucose 70 - 99 mg/dL 120(H) 203(H) 129(H)  BUN 8 - 23 mg/dL 34(H) 47(H) 23  Creatinine 0.44 - 1.00 mg/dL 1.13(H) 1.73(H) 1.29(H)  Sodium 135 - 145 mmol/L 139 135 134(L)  Potassium 3.5 - 5.1 mmol/L 4.1 4.3 3.7  Chloride 98 - 111 mmol/L 104 101 98  CO2 22 - 32 mmol/L 27 26 24   Calcium 8.9 - 10.3 mg/dL 8.3(L) 8.5(L) 8.5(L)  Total Protein 6.5 - 8.1 g/dL 6.7 6.6 7.0  Total Bilirubin 0.3 - 1.2 mg/dL 1.2 0.7 3.4(H)  Alkaline Phos 38 - 126 U/L 91 89 86  AST 15 - 41 U/L 22 26 21   ALT 0 - 44 U/L 29 32 16   WBC, creatinine, and bilirubin are all improving.

## 2019-11-29 NOTE — Discharge Summary (Addendum)
Physician Discharge Summary  Patient ID: Kathryn Thomas MRN: [id number] DOB/AGE: [birth year] 79 y.o.  Admit date: 11/25/2019 Discharge date: 12/01/2019  Admission Diagnoses:  Acute cholecystitis/cholelithiasis Abnormal EKG/DOE Hypertension Hx of B12 anemia Hx gastric polyps Hypertension Anxiety  Chronic back pain BMI 38 Constipation/ hx anal stenosis AKI   Discharge Diagnoses:  Acute cholecystitis/cholelithiasis Gout right knee Abnormal EKG/DOE Hypertension Hx of B12 anemia Hx gastric polyps Hypertension Anxiety  Chronic back pain BMI 38 Constipation/ hx anal stenosis AKI - resolving   Active Problems:   Acute cholecystitis   Abnormal cholangiogram   Total bilirubin, elevated   PROCEDURES:  Laparoscopic cholecystectomy with intraoperative cholangiogram 11/26/2019, Dr. Armandina Gemma, ERCP/sphincterotomy 11/27/2019, Dr. Lilla Shook Course: Patient is a 79 year old female who presented to [place] with complaints of vomiting since Monday evening 11/22/2019.  She also reports receiving her Covid vaccine on Sunday, 11/21/2019. She reported abdominal pain, chills, sweats starting Sunday.  She has had ongoing abdominal pain, nausea and vomiting.  She was initially seen in Urgent care and referred to the [place] ED.  Work-up in the ED: Temperature 99.3 heart rates range between 39-47.  Blood pressure stable.  Labs show a potassium of 3.3, chloride of 95, glucose of 206, creatinine 1.33, alk phos is 12, lipase 18, AST 20, ALT 15, bilirubin 2.3.  Troponin XV lactate 1.6, WBC 28.3, hemoglobin 15.9, hematocrit 48.4, platelets 286,000.  Covid is negative.  UA shows many bacteria with 6-10 white cells per high-powered field.  Blood cultures x2 is pending.  EKG: Shows a tachycardia with ventricular bigeminy.  Question of ST elevation inferior changes.  CT scan with contrast shows a large gallstone in the neck of the gallbladder.  Gallbladder is dilated  with thickened wall is rounded inflammatory changes consistent with acute cholecystitis.  No focal dilatation pancreas is unremarkable.  Findings consistent with acute cholecystitis and a large gallstone noted in the gallbladder.  Patient is being transferred to [facility] for evaluation.  We are ask to see.  She was seen in the ED at [facility]  She was admitted for Cholecystitis with cholelithiasis.  She complained of increased DOE and had some bigeminy and trigeminy on admission.  She was seen by Cardiology and cleared for surgery.   She was taken the operating room the following day on 11/26/2019 and underwent laparoscopic cholecystectomy with intraoperative cholangiogram.  Cholangiogram was worrisome for near occlusive choledocholithiasis, possible stone versus sludge versus pus versus clot.  She was seen in consultation by Dr. Lucio Edward.  On 11/27/2019 she underwent ERCP with cholangiogram. - The examination was suspicious for CBD sludge. - Prior cholecystectomy. - A biliary sphincterotomy was performed. - The biliary tree was swept and nothing was found. She did well after her ERCP complained of some upper abdominal soreness.  She also complained of several days of constipation.  She was treated with MiraLAX and Dulcolax on 11/28/2019.  She is still complaining of some constipation on 11/29/2019.  Treated with additional MiraLAX and a Fleet enema.  She also complained of some left knee pain which she has had in the past. Patient came in with an elevated creatinine.  This actually increased and then showed improvement.  Liver function studies showed an elevated bilirubin which came down after ERCP.  WBC also improved and came down after cholecystectomy and ERCP.  She was doing well essentially ready for discharge on 11/29/19 from her ERCP and cholecystectomy.  She was tolerating  soft diet, her renal function was improving, abdominal exam was quite normal for postop cholecystectomy.  At this point she  was complaining of significant discomfort in her right knee.  She was walking with a walker which she was not doing prior to admission.  We suspected gout; uric acid was normal.  She was seen in consultation by Dr. Wylene Simmer orthopedics service. We obtained knee films that showed no acute fracture and normal alignment.  There was a joint effusion present,  And some degenerative changes.  Dr. Doran Durand recommended they aspirate the effusion and inject steroids. The patient declined this treatment twice.  By 12/01/2019 she felt she was ready for discharge.  It was her opinion she did not have gout, but did accept the Indocin recommended as a second treatment option.  I recommended she return and see her primary care physician next week and let him review all her medications.  In addition to the Tylenol and oxycodone we are going to give her 5 more days of Indocin.  Follow-up instructions are below.   Condition on discharge: Improved  CBC Latest Ref Rng & Units 12/01/2019 11/29/2019 11/28/2019  WBC 4.0 - 10.5 K/uL 7.9 9.5 17.0(H)  Hemoglobin 12.0 - 15.0 g/dL 11.7(L) 12.5 11.9(L)  Hematocrit 36.0 - 46.0 % 38.2 40.7 38.8  Platelets 150 - 400 K/uL 245 245 240   CMP Latest Ref Rng & Units 12/01/2019 11/30/2019 11/29/2019  Glucose 70 - 99 mg/dL 141(H) 131(H) 120(H)  BUN 8 - 23 mg/dL 18 23 34(H)  Creatinine 0.44 - 1.00 mg/dL 0.83 0.95 1.13(H)  Sodium 135 - 145 mmol/L 140 142 139  Potassium 3.5 - 5.1 mmol/L 4.6 4.2 4.1  Chloride 98 - 111 mmol/L 104 106 104  CO2 22 - 32 mmol/L 32 30 27  Calcium 8.9 - 10.3 mg/dL 8.5(L) 8.3(L) 8.3(L)  Total Protein 6.5 - 8.1 g/dL - 6.0(L) 6.7  Total Bilirubin 0.3 - 1.2 mg/dL - 0.8 1.2  Alkaline Phos 38 - 126 U/L - 79 91  AST 15 - 41 U/L - 15 22  ALT 0 - 44 U/L - 24 29   AKI creatinine 0.98(04/09/19) >>1.29>> 1.73>> 1.13>> 0.95(3/9) >>0.83(3/10)       Disposition: Home   Allergies as of 12/01/2019   No Known Allergies     Medication List    STOP taking these  medications   cephALEXin 500 MG capsule Commonly known as: KEFLEX   ibuprofen 600 MG tablet Commonly known as: ADVIL   traMADol 50 MG tablet Commonly known as: ULTRAM     TAKE these medications   acetaminophen 325 MG tablet Commonly known as: TYLENOL You can take 2 tablets every 4 hours as needed for pain.  Do not take more than 4000 mg of Tylenol per day.  It can harm your liver. You can buy this over the counter at any drug store.   ALPRAZolam 0.5 MG tablet Commonly known as: XANAX Take 0.25 mg by mouth 2 (two) times daily as needed for anxiety. What changed: Another medication with the same name was removed. Continue taking this medication, and follow the directions you see here.   amLODipine 2.5 MG tablet Commonly known as: NORVASC Take 2.5 mg by mouth daily.   buPROPion 150 MG 12 hr tablet Commonly known as: WELLBUTRIN SR Take 1 tablet (150 mg total) by mouth 2 (two) times daily.   celecoxib 200 MG capsule Commonly known as: CELEBREX Talk to your primary care before resuming this medicine.  Your kidney function was elevated here in the hospital. What changed:   how much to take  how to take this  when to take this  additional instructions   docusate sodium 100 MG capsule Commonly known as: COLACE Take 1 capsule (100 mg total) by mouth 2 (two) times daily as needed for mild constipation or moderate constipation (use as needed for constipation).   fluticasone 50 MCG/ACT nasal spray Commonly known as: FLONASE Place 2 sprays into both nostrils daily.   hydrochlorothiazide 12.5 MG capsule Commonly known as: MICROZIDE Take 1 capsule (12.5 mg total) by mouth daily.   indomethacin 25 MG capsule Commonly known as: INDOCIN Take 1 capsule (25 mg total) by mouth 3 (three) times daily with meals.   lisinopril 10 MG tablet Commonly known as: ZESTRIL TAKE ONE TABLET BY MOUTH ONCE DAILY   oxyCODONE 5 MG immediate release tablet Commonly known as: Oxy  IR/ROXICODONE Take 1-2 tablets (5-10 mg total) by mouth every 4 (four) hours as needed for severe pain or breakthrough pain (for pain not relieved by Tylenol).   polyethylene glycol 17 g packet Commonly known as: MIRALAX / GLYCOLAX Take 17 g by mouth daily as needed for mild constipation, moderate constipation or severe constipation (use as needed follow package directions).   Vitamin D (Ergocalciferol) 1.25 MG (50000 UNIT) Caps capsule Commonly known as: DRISDOL Take 50,000 Units by mouth every 7 (seven) days. On Wednesday.      Follow-up Information    [facility] [place] Follow up on 12/16/2019.   Specialty: General Surgery Why: 3:30pm, arrive by 3:00pm for paperwork and check in process.  please bring insurance card and photo ID Contact information: [place] [address] [phone]        Benito Mccreedy, MD Follow up.   Specialty: Internal Medicine Why: Call and let him know you had surgery.  Let him review all your medicines and adjust as needed.  You had issues with diminished renal function during your stay.   Contact information: [address] [place] [address] [phone]        Wylene Simmer, MD. Schedule an appointment as soon as possible for a visit in 3 week(s).   Specialty: Orthopedic Surgery Why: Call and make appointment if right knee pain continues. Contact information: [address] [place] [address] [phone]           Signed: Earnstine Regal 12/01/2019, 8:56 AM

## 2019-11-29 NOTE — Progress Notes (Signed)
While rounding floor I met daughter of Pt in hallway. Daughter invited Chaplain in to room. Kathryn Thomas was awake and talking. She mentioned her stay here and the procedure done to her. She said she goes to a [employer] and feels much better today. She voiced her faith and hope in God. She also mentioned life stories and family dynamics. She said the plan for her discharge is hopefully tomorrow. [name], ministry of presence and prayer.   Chaplain Resident Fidel Levy [phone]

## 2019-11-30 LAB — COMPREHENSIVE METABOLIC PANEL
ALT: 24 U/L (ref 0–44)
AST: 15 U/L (ref 15–41)
Albumin: 2.8 g/dL — ABNORMAL LOW (ref 3.5–5.0)
Alkaline Phosphatase: 79 U/L (ref 38–126)
Anion gap: 6 (ref 5–15)
BUN: 23 mg/dL (ref 8–23)
CO2: 30 mmol/L (ref 22–32)
Calcium: 8.3 mg/dL — ABNORMAL LOW (ref 8.9–10.3)
Chloride: 106 mmol/L (ref 98–111)
Creatinine, Ser: 0.95 mg/dL (ref 0.44–1.00)
GFR calc Af Amer: >60 mL/min (ref >60)
GFR calc non Af Amer: 57 mL/min — ABNORMAL LOW (ref >60)
Glucose, Bld: 131 mg/dL — ABNORMAL HIGH (ref 70–99)
Potassium: 4.2 mmol/L (ref 3.5–5.1)
Sodium: 142 mmol/L (ref 135–145)
Total Bilirubin: 0.8 mg/dL (ref 0.3–1.2)
Total Protein: 6 g/dL — ABNORMAL LOW (ref 6.5–8.1)

## 2019-11-30 LAB — CULTURE, BLOOD (ROUTINE X 2)
Culture: NO GROWTH
Culture: NO GROWTH
Special Requests: ADEQUATE

## 2019-11-30 MED ORDER — CHLORHEXIDINE GLUCONATE CLOTH 2 % EX PADS
6.0000 | MEDICATED_PAD | Freq: Every day | CUTANEOUS | Status: DC
  Administered 2019-12-01: 6 via TOPICAL

## 2019-11-30 MED ORDER — MUPIROCIN 2 % EX OINT
1.0000 "application " | TOPICAL_OINTMENT | Freq: Two times a day (BID) | CUTANEOUS | Status: DC
  Administered 2019-11-30 – 2019-12-01 (×3): 1 via NASAL
  Filled 2019-11-30: qty 22

## 2019-11-30 MED ORDER — ENOXAPARIN SODIUM 40 MG/0.4ML ~~LOC~~ SOLN
40.0000 mg | Freq: Every day | SUBCUTANEOUS | Status: DC
  Administered 2019-11-30 – 2019-12-01 (×2): 40 mg via SUBCUTANEOUS
  Filled 2019-11-30 (×2): qty 0.4

## 2019-11-30 NOTE — Progress Notes (Signed)
3 Days Post-Op    CC:  Subjective: Patient tired, but knee is feeling somewhat better on indomethacin.  Tolerating a regular diet.  Weak and wants to walk more.  Slightly dizziness this am when getting up.  Abdominal pain seems controlled.   Objective: Vital signs in last 24 hours: Temp:  [98.1 F (36.7 C)-98.4 F (36.9 C)] 98.1 F (36.7 C) (03/09 0556) Pulse Rate:  [68-75] 68 (03/09 0556) Resp:  [15-20] 20 (03/09 0556) BP: (104-132)/(67-97) 130/97 (03/09 0556) SpO2:  [83 %-97 %] 97 % (03/09 0556) Last BM Date: 11/29/19 Nothing in intake/output except urine x1 Afebrile vital signs are stable Creatinine down to 0.95 this AM.   Intake/Output from previous day: No intake/output data recorded. Intake/Output this shift: No intake/output data recorded.  PE: Heart: regular Lungs: CTAB Abd: soft, appropriately tender, +BS, ND, obese, incisions are c/d/i Ext: right knee with some edema, but no erythema.  Tender to touch on medial side of knee.  Lab Results:  Recent Labs    11/28/19 0510 11/29/19 0932  WBC 17.0* 9.5  HGB 11.9* 12.5  HCT 38.8 40.7  PLT 240 245    BMET Recent Labs    11/29/19 0932 11/30/19 0448  NA 139 142  K 4.1 4.2  CL 104 106  CO2 27 30  GLUCOSE 120* 131*  BUN 34* 23  CREATININE 1.13* 0.95  CALCIUM 8.3* 8.3*   PT/INR No results for input(s): LABPROT, INR in the last 72 hours.  Recent Labs  Lab 11/25/19 0846 11/26/19 0539 11/28/19 0510 11/29/19 0932 11/30/19 0448  AST 20 21 26 22 15   ALT 15 16 32 29 24  ALKPHOS 77 86 89 91 79  BILITOT 2.3* 3.4* 0.7 1.2 0.8  PROT 7.7 7.0 6.6 6.7 6.0*  ALBUMIN 3.4* 2.9* 2.8* 2.7* 2.8*     Lipase     Component Value Date/Time   LIPASE 18 11/25/2019 0846     Medications: . docusate sodium  200 mg Oral Daily  . indomethacin  50 mg Oral TID WC  . lidocaine  10 mL Other Once  . lip balm  1 application Topical BID  . polyethylene glycol  17 g Oral Daily  . triamcinolone acetonide  40 mg  Intra-articular Once    Assessment/Plan Right knee pain/gout  -Consult Dr. Wylene Simmer -recommended aspiration of the knee/steroid injection; patient deferred he recommends Indocin, states this is helping some AKI creatinine 0.98(04/09/19) >> 1.29 >> 1.73>> 1.13>> 0.95(3/9)  Abnormal EKG/DOE Hypertension Hx of B12 anemia Hx gastric polyps Hypertension Anxiety  Chronic back pain BMI 38 Constipation/ hx anal stenosis    Acute cholecystitis/cholelithiasis Laparoscopic cholecystectomy with intraoperative cholangiogram 11/26/2019, Dr. Armandina Gemma, POD #4 ERCP/sphincterotomy 11/27/2019, Dr. Lucio Edward, POD #3 -cont to mobilize, pulm toilet, IS -cont regular diet -if patient continues to improve would plan for DC home tomorrow.   FEN:  Soft diet ID: Zosyn 3/4 >> 3/8 DVT: SCD's, Lovenox Follow up: [facility] clinic, PCP, ortho   LOS: 5 days    Kathryn Thomas,Kathryn Thomas 11/30/2019 Please see Amion

## 2019-11-30 NOTE — Progress Notes (Signed)
PT Cancellation Note  Patient Details Name: Kathryn Thomas MRN: [id number] DOB: Apr 14, 1941   Cancelled Treatment:    Reason Eval/Treat Not Completed: Medical issues which prohibited therapy, patient reports that earlier she had IV medication and felt her hear racing. Patient wants to rest. Will check back tomorrow. Still with knee Pain.    Claretha Cooper 11/30/2019, 5:15 PM [place] Pager [phone] Office [phone]

## 2019-11-30 NOTE — Progress Notes (Addendum)
Subjective: 3 Days Post-Op Procedure(s) (LRB): ENDOSCOPIC RETROGRADE CHOLANGIOPANCREATOGRAPHY (ERCP) (N/A) SPHINCTEROTOMY REMOVAL OF STONES  Patient reports pain as moderate.  Patient reports that her R knee feels better today as she has not been walking on it. She reports that she has been dealing with intermittent R knee pain for about a year now.   She is reluctant to consider aspiration and injection.  She is accompanied by her daughter.  Objective:   VITALS:  Temp:  [98.1 F (36.7 C)-98.4 F (36.9 C)] 98.1 F (36.7 C) (03/09 0556) Pulse Rate:  [68-75] 68 (03/09 0556) Resp:  [20] 20 (03/09 0556) BP: (130-132)/(67-97) 130/97 (03/09 0556) SpO2:  [83 %-97 %] 97 % (03/09 0556)  General: WDWN patient in NAD. Psych:  Appropriate mood and affect. Neuro:  A&O x 3, Moving all extremities, sensation intact to light touch HEENT:  EOMs intact Chest:  Even non-labored respirations Skin:  Incision C/D/I, no rashes or lesions Extremities: warm/dry, mild edema to medial joint line of R knee, no erythema or echymosis.  No lymphadenopathy. Pulses: Popliteus 2+ MSK:  ROM: TKE, MMT: able to perform quad set, (-) Homan's    LABS Recent Labs    11/28/19 0510 11/29/19 0932  HGB 11.9* 12.5  WBC 17.0* 9.5  PLT 240 245   Recent Labs    11/29/19 0932 11/30/19 0448  NA 139 142  K 4.1 4.2  CL 104 106  CO2 27 30  BUN 34* 23  CREATININE 1.13* 0.95  GLUCOSE 120* 131*   No results for input(s): LABPT, INR in the last 72 hours.   Assessment/Plan: 3 Days Post-Op Procedure(s) (LRB): ENDOSCOPIC RETROGRADE CHOLANGIOPANCREATOGRAPHY (ERCP) (N/A) SPHINCTEROTOMY REMOVAL OF STONES  Acute R knee pain  I again offered aspiration and injection to patient's R knee.  She declined this procedure today.  Continue indomethacin.  Will anticipate continued gradual improvement and spontaneous resolution of symptoms of knee pain in the next several days if it is gout.  She may WBAT R LE  Follow up  outpatient if R knee pain continues.  Mechele Claude PA-C [facility] Office:  [phone]  Agree with note above.  F/u in the office if a few weeks if the knee is still troublesome.  Ortho signing off.  Pls call [phone] with questions.

## 2019-12-01 LAB — CBC
HCT: 38.2 % (ref 36.0–46.0)
Hemoglobin: 11.7 g/dL — ABNORMAL LOW (ref 12.0–15.0)
MCH: 28.2 pg (ref 26.0–34.0)
MCHC: 30.6 g/dL (ref 30.0–36.0)
MCV: 92 fL (ref 80.0–100.0)
Platelets: 245 10*3/uL (ref 150–400)
RBC: 4.15 MIL/uL (ref 3.87–5.11)
RDW: 14.2 % (ref 11.5–15.5)
WBC: 7.9 10*3/uL (ref 4.0–10.5)
nRBC: 0 % (ref 0.0–0.2)

## 2019-12-01 LAB — BASIC METABOLIC PANEL
Anion gap: 4 — ABNORMAL LOW (ref 5–15)
BUN: 18 mg/dL (ref 8–23)
CO2: 32 mmol/L (ref 22–32)
Calcium: 8.5 mg/dL — ABNORMAL LOW (ref 8.9–10.3)
Chloride: 104 mmol/L (ref 98–111)
Creatinine, Ser: 0.83 mg/dL (ref 0.44–1.00)
GFR calc Af Amer: >60 mL/min (ref >60)
GFR calc non Af Amer: >60 mL/min (ref >60)
Glucose, Bld: 141 mg/dL — ABNORMAL HIGH (ref 70–99)
Potassium: 4.6 mmol/L (ref 3.5–5.1)
Sodium: 140 mmol/L (ref 135–145)

## 2019-12-01 MED ORDER — POLYETHYLENE GLYCOL 3350 17 G PO PACK: 17.0000 g | PACK | Freq: Every day | ORAL | 0 refills | Status: DC | PRN

## 2019-12-01 MED ORDER — DOCUSATE SODIUM 100 MG PO CAPS: 100.0000 mg | ORAL_CAPSULE | Freq: Two times a day (BID) | ORAL | 0 refills | Status: DC | PRN

## 2019-12-01 MED ORDER — INDOMETHACIN 25 MG PO CAPS: 25.0000 mg | ORAL_CAPSULE | Freq: Three times a day (TID) | ORAL | 0 refills | Status: DC

## 2019-12-01 NOTE — Progress Notes (Signed)
IV discontinued from Right arm without complication. Spoke with daughter over the phone regarding discharge plans. Cardiac monitor discontinued. Pt's VSS. Resting in bed with call light within reach. Waiting for daughter to arrive to facility to review discharge instructions and medication list. Will cont to mx.

## 2019-12-01 NOTE — Progress Notes (Signed)
4 Days Post-Op    CC: Abdominal pain  Subjective: Tolerating a regular diet, her abdomen feels better.  She still complaining some of her knee but says is better and she can walk on it.  She does not think she has gout and did not want the treatment offered by Dr. Doran Durand.  Objective: Vital signs in last 24 hours: Temp:  [97.7 F (36.5 C)-98.1 F (36.7 C)] 98.1 F (36.7 C) (03/10 0535) Pulse Rate:  [57-68] 68 (03/10 0535) Resp:  [18-20] 20 (03/10 0535) BP: (114-169)/(64-84) 114/83 (03/10 0535) SpO2:  [94 %-95 %] 95 % (03/10 0535) Last BM Date: 11/29/19 720 p.o. Voided x2 BM x1 Afebrile vital signs are stable  Intake/Output from previous day: 03/09 0701 - 03/10 0700 In: 720 [P.O.:720] Out: 3 [Urine:2; Stool:1] Intake/Output this shift: No intake/output data recorded.  General appearance: alert, cooperative and no distress Resp: clear to auscultation bilaterally GI: Soft, less tender, positive bowel sounds, positive BM, tolerating regular diet. Extremities: Right knee is still little tender and stiff but she says she can walk on it..  Lab Results:  Recent Labs    11/29/19 0932 12/01/19 0734  WBC 9.5 7.9  HGB 12.5 11.7*  HCT 40.7 38.2  PLT 245 245    BMET Recent Labs    11/30/19 0448 12/01/19 0734  NA 142 140  K 4.2 4.6  CL 106 104  CO2 30 32  GLUCOSE 131* 141*  BUN 23 18  CREATININE 0.95 0.83  CALCIUM 8.3* 8.5*   PT/INR No results for input(s): LABPROT, INR in the last 72 hours.  Recent Labs  Lab 11/25/19 0846 11/26/19 0539 11/28/19 0510 11/29/19 0932 11/30/19 0448  AST 20 21 26 22 15   ALT 15 16 32 29 24  ALKPHOS 77 86 89 91 79  BILITOT 2.3* 3.4* 0.7 1.2 0.8  PROT 7.7 7.0 6.6 6.7 6.0*  ALBUMIN 3.4* 2.9* 2.8* 2.7* 2.8*     Lipase     Component Value Date/Time   LIPASE 18 11/25/2019 0846     Medications: . Chlorhexidine Gluconate Cloth  6 each Topical Daily  . docusate sodium  200 mg Oral Daily  . enoxaparin (LOVENOX) injection  40 mg  Subcutaneous Daily  . indomethacin  50 mg Oral TID WC  . lidocaine  10 mL Other Once  . lip balm  1 application Topical BID  . mupirocin ointment  1 application Nasal BID  . polyethylene glycol  17 g Oral Daily  . triamcinolone acetonide  40 mg Intra-articular Once    Assessment/Plan Right knee pain/gout  -Consult Dr. Wylene Simmer -recommended aspiration of the knee/steroid injection; patient deferred he recommends Indocin, states this is helping some AKI creatinine 0.98(04/09/19) >>1.29>> 1.73>> 1.13>> 0.95(3/9) >>0.83(3/10)  Abnormal EKG/DOE Hypertension Hx of B12 anemia Hx gastric polyps Hypertension Anxiety  Chronic back pain BMI 38 Constipation/ hx anal stenosis     Acute cholecystitis/cholelithiasis Laparoscopic cholecystectomy with intraoperative cholangiogram 11/26/2019, Dr. Armandina Gemma, POD #5 ERCP/sphincterotomy 11/27/2019, Dr. Lucio Edward, POD #2  FEN: regular/IV fluids ID: Zosyn 3/4 >> day 5 DVT: SCD's/Lovenox Follow up: TBD  Plan: We will discharge home today.  We will give her 5 more days of Indocin.  I recommended she call her primary care physician and follow-up with him next week if possible.  He should review her hospital course and medicines at that time.   LOS: 6 days    Vuong Musa 12/01/2019 Please see Amion

## 2019-12-01 NOTE — Plan of Care (Signed)
  Problem: Clinical Measurements: Goal: Will remain free from infection Outcome: Progressing   Problem: Activity: Goal: Risk for activity intolerance will decrease Outcome: Progressing   Problem: Nutrition: Goal: Adequate nutrition will be maintained Outcome: Progressing   Problem: Coping: Goal: Level of anxiety will decrease Outcome: Progressing   Problem: Safety: Goal: Ability to remain free from injury will improve Outcome: Progressing   

## 2019-12-13 DIAGNOSIS — R69 Illness, unspecified: Secondary | ICD-10-CM | POA: Diagnosis not present

## 2019-12-15 ENCOUNTER — Ambulatory Visit: Payer: Medicare HMO | Attending: Internal Medicine

## 2019-12-15 DIAGNOSIS — Z23 Encounter for immunization: Secondary | ICD-10-CM

## 2019-12-15 NOTE — Progress Notes (Signed)
   Covid-19 Vaccination Clinic  Name:  COURAGE MOWER    MRN: [id number] DOB: [birth year]  12/15/2019  Ms. Pedalino was observed post Covid-19 immunization for 15 minutes without incident. She was provided with Vaccine Information Sheet and instruction to access the V-Safe system.   Ms. Kintner was instructed to call 911 with any severe reactions post vaccine: Marland Kitchen Difficulty breathing  . Swelling of face and throat  . A fast heartbeat  . A bad rash all over body  . Dizziness and weakness   Immunizations Administered    Name Date Dose VIS Date Route   Pfizer COVID-19 Vaccine 12/15/2019  2:50 PM 0.3 mL 09/03/2019 Intramuscular   Manufacturer: [place]   Lot: [id number]   [place]: [id number]

## 2019-12-22 DIAGNOSIS — R69 Illness, unspecified: Secondary | ICD-10-CM | POA: Diagnosis not present

## 2019-12-29 ENCOUNTER — Ambulatory Visit: Payer: Medicare HMO | Admitting: Cardiovascular Disease

## 2020-02-14 DIAGNOSIS — D518 Other vitamin B12 deficiency anemias: Secondary | ICD-10-CM | POA: Diagnosis not present

## 2020-02-28 DIAGNOSIS — E782 Mixed hyperlipidemia: Secondary | ICD-10-CM | POA: Diagnosis not present

## 2020-02-28 DIAGNOSIS — E559 Vitamin D deficiency, unspecified: Secondary | ICD-10-CM | POA: Diagnosis not present

## 2020-02-28 DIAGNOSIS — J301 Allergic rhinitis due to pollen: Secondary | ICD-10-CM | POA: Diagnosis not present

## 2020-02-28 DIAGNOSIS — R69 Illness, unspecified: Secondary | ICD-10-CM | POA: Diagnosis not present

## 2020-02-28 DIAGNOSIS — E1165 Type 2 diabetes mellitus with hyperglycemia: Secondary | ICD-10-CM | POA: Diagnosis not present

## 2020-02-28 DIAGNOSIS — Z1329 Encounter for screening for other suspected endocrine disorder: Secondary | ICD-10-CM | POA: Diagnosis not present

## 2020-02-28 DIAGNOSIS — I10 Essential (primary) hypertension: Secondary | ICD-10-CM | POA: Diagnosis not present

## 2020-05-15 DIAGNOSIS — Z1321 Encounter for screening for nutritional disorder: Secondary | ICD-10-CM | POA: Diagnosis not present

## 2020-05-15 DIAGNOSIS — R69 Illness, unspecified: Secondary | ICD-10-CM | POA: Diagnosis not present

## 2020-05-15 DIAGNOSIS — E559 Vitamin D deficiency, unspecified: Secondary | ICD-10-CM | POA: Diagnosis not present

## 2020-05-15 DIAGNOSIS — E782 Mixed hyperlipidemia: Secondary | ICD-10-CM | POA: Diagnosis not present

## 2020-05-15 DIAGNOSIS — E1165 Type 2 diabetes mellitus with hyperglycemia: Secondary | ICD-10-CM | POA: Diagnosis not present

## 2020-05-15 DIAGNOSIS — J301 Allergic rhinitis due to pollen: Secondary | ICD-10-CM | POA: Diagnosis not present

## 2020-05-15 DIAGNOSIS — Z1329 Encounter for screening for other suspected endocrine disorder: Secondary | ICD-10-CM | POA: Diagnosis not present

## 2020-05-15 DIAGNOSIS — I10 Essential (primary) hypertension: Secondary | ICD-10-CM | POA: Diagnosis not present

## 2020-05-15 DIAGNOSIS — Z0001 Encounter for general adult medical examination with abnormal findings: Secondary | ICD-10-CM | POA: Diagnosis not present

## 2020-06-12 DIAGNOSIS — R69 Illness, unspecified: Secondary | ICD-10-CM | POA: Diagnosis not present

## 2020-06-13 DIAGNOSIS — H25813 Combined forms of age-related cataract, bilateral: Secondary | ICD-10-CM | POA: Diagnosis not present

## 2020-07-19 DIAGNOSIS — H2511 Age-related nuclear cataract, right eye: Secondary | ICD-10-CM | POA: Diagnosis not present

## 2020-08-01 DIAGNOSIS — H25811 Combined forms of age-related cataract, right eye: Secondary | ICD-10-CM | POA: Diagnosis not present

## 2020-08-01 DIAGNOSIS — H268 Other specified cataract: Secondary | ICD-10-CM | POA: Diagnosis not present

## 2020-08-01 DIAGNOSIS — H2511 Age-related nuclear cataract, right eye: Secondary | ICD-10-CM | POA: Diagnosis not present

## 2020-08-10 DIAGNOSIS — H2512 Age-related nuclear cataract, left eye: Secondary | ICD-10-CM | POA: Diagnosis not present

## 2020-08-22 DIAGNOSIS — H25812 Combined forms of age-related cataract, left eye: Secondary | ICD-10-CM | POA: Diagnosis not present

## 2020-08-22 DIAGNOSIS — H2512 Age-related nuclear cataract, left eye: Secondary | ICD-10-CM | POA: Diagnosis not present

## 2020-08-22 DIAGNOSIS — H268 Other specified cataract: Secondary | ICD-10-CM | POA: Diagnosis not present

## 2021-01-27 ENCOUNTER — Encounter (HOSPITAL_BASED_OUTPATIENT_CLINIC_OR_DEPARTMENT_OTHER): Payer: Self-pay | Admitting: Emergency Medicine

## 2021-01-27 ENCOUNTER — Other Ambulatory Visit: Payer: Self-pay

## 2021-01-27 ENCOUNTER — Emergency Department (HOSPITAL_BASED_OUTPATIENT_CLINIC_OR_DEPARTMENT_OTHER): Payer: Medicare HMO

## 2021-01-27 ENCOUNTER — Emergency Department (HOSPITAL_BASED_OUTPATIENT_CLINIC_OR_DEPARTMENT_OTHER)
Admission: EM | Admit: 2021-01-27 | Discharge: 2021-01-27 | Disposition: A | Discharge status: Home / Self Care | Payer: Medicare HMO | Attending: Emergency Medicine | Admitting: Emergency Medicine

## 2021-01-27 DIAGNOSIS — Z79899 Other long term (current) drug therapy: Secondary | ICD-10-CM | POA: Diagnosis not present

## 2021-01-27 DIAGNOSIS — K6389 Other specified diseases of intestine: Secondary | ICD-10-CM

## 2021-01-27 DIAGNOSIS — R112 Nausea with vomiting, unspecified: Secondary | ICD-10-CM | POA: Diagnosis present

## 2021-01-27 DIAGNOSIS — Z20822 Contact with and (suspected) exposure to covid-19: Secondary | ICD-10-CM | POA: Diagnosis not present

## 2021-01-27 DIAGNOSIS — I10 Essential (primary) hypertension: Secondary | ICD-10-CM | POA: Diagnosis not present

## 2021-01-27 DIAGNOSIS — E876 Hypokalemia: Secondary | ICD-10-CM | POA: Insufficient documentation

## 2021-01-27 DIAGNOSIS — K353 Acute appendicitis with localized peritonitis, without perforation or gangrene: Secondary | ICD-10-CM | POA: Diagnosis not present

## 2021-01-27 DIAGNOSIS — K3580 Unspecified acute appendicitis: Secondary | ICD-10-CM | POA: Insufficient documentation

## 2021-01-27 DIAGNOSIS — R109 Unspecified abdominal pain: Secondary | ICD-10-CM | POA: Diagnosis not present

## 2021-01-27 LAB — COMPREHENSIVE METABOLIC PANEL
ALT: 19 U/L (ref 0–44)
AST: 24 U/L (ref 15–41)
Albumin: 3.6 g/dL (ref 3.5–5.0)
Alkaline Phosphatase: 78 U/L (ref 38–126)
Anion gap: 8 (ref 5–15)
BUN: 13 mg/dL (ref 8–23)
CO2: 25 mmol/L (ref 22–32)
Calcium: 8.1 mg/dL — ABNORMAL LOW (ref 8.9–10.3)
Chloride: 102 mmol/L (ref 98–111)
Creatinine, Ser: 0.82 mg/dL (ref 0.44–1.00)
GFR, Estimated: >60 mL/min (ref >60)
Glucose, Bld: 151 mg/dL — ABNORMAL HIGH (ref 70–99)
Potassium: 3.2 mmol/L — ABNORMAL LOW (ref 3.5–5.1)
Sodium: 135 mmol/L (ref 135–145)
Total Bilirubin: 1 mg/dL (ref 0.3–1.2)
Total Protein: 7.4 g/dL (ref 6.5–8.1)

## 2021-01-27 LAB — CBC WITH DIFFERENTIAL/PLATELET
Abs Immature Granulocytes: 0.04 10*3/uL (ref 0.00–0.07)
Basophils Absolute: 0 10*3/uL (ref 0.0–0.1)
Basophils Relative: 0 %
Eosinophils Absolute: 0.1 10*3/uL (ref 0.0–0.5)
Eosinophils Relative: 1 %
HCT: 45.6 % (ref 36.0–46.0)
Hemoglobin: 15.1 g/dL — ABNORMAL HIGH (ref 12.0–15.0)
Immature Granulocytes: 1 %
Lymphocytes Relative: 12 %
Lymphs Abs: 0.9 10*3/uL (ref 0.7–4.0)
MCH: 28.7 pg (ref 26.0–34.0)
MCHC: 33.1 g/dL (ref 30.0–36.0)
MCV: 86.5 fL (ref 80.0–100.0)
Monocytes Absolute: 0.4 10*3/uL (ref 0.1–1.0)
Monocytes Relative: 5 %
Neutro Abs: 5.7 10*3/uL (ref 1.7–7.7)
Neutrophils Relative %: 81 %
Platelets: 218 10*3/uL (ref 150–400)
RBC: 5.27 MIL/uL — ABNORMAL HIGH (ref 3.87–5.11)
RDW: 13.3 % (ref 11.5–15.5)
WBC: 7 10*3/uL (ref 4.0–10.5)
nRBC: 0 % (ref 0.0–0.2)

## 2021-01-27 LAB — URINALYSIS, MICROSCOPIC (REFLEX)

## 2021-01-27 LAB — URINALYSIS, ROUTINE W REFLEX MICROSCOPIC
Glucose, UA: NEGATIVE mg/dL
Ketones, ur: NEGATIVE mg/dL
Leukocytes,Ua: NEGATIVE
Nitrite: POSITIVE — AB
Protein, ur: >300 mg/dL — AB
Specific Gravity, Urine: >1.03 — ABNORMAL HIGH (ref 1.005–1.030)
pH: 6 (ref 5.0–8.0)

## 2021-01-27 LAB — LIPASE, BLOOD: Lipase: 19 U/L (ref 11–51)

## 2021-01-27 LAB — RESP PANEL BY RT-PCR (FLU A&B, COVID) ARPGX2
Influenza A by PCR: NEGATIVE
Influenza B by PCR: NEGATIVE
SARS Coronavirus 2 by RT PCR: NEGATIVE

## 2021-01-27 MED ORDER — SODIUM CHLORIDE 0.9 % IV BOLUS
1000.0000 mL | Freq: Once | INTRAVENOUS | Status: AC
  Administered 2021-01-27: 1000 mL via INTRAVENOUS

## 2021-01-27 MED ORDER — ONDANSETRON 4 MG PO TBDP: 4.0000 mg | ORAL_TABLET | Freq: Three times a day (TID) | ORAL | 0 refills | Status: DC | PRN

## 2021-01-27 MED ORDER — ONDANSETRON HCL 4 MG/2ML IJ SOLN
4.0000 mg | Freq: Once | INTRAMUSCULAR | Status: AC
  Administered 2021-01-27: 4 mg via INTRAVENOUS
  Filled 2021-01-27: qty 2

## 2021-01-27 MED ORDER — IOHEXOL 300 MG/ML  SOLN
100.0000 mL | Freq: Once | INTRAMUSCULAR | Status: AC
  Administered 2021-01-27: 100 mL via INTRAVENOUS

## 2021-01-27 MED ORDER — POTASSIUM CHLORIDE CRYS ER 20 MEQ PO TBCR
40.0000 meq | EXTENDED_RELEASE_TABLET | Freq: Once | ORAL | Status: AC
  Administered 2021-01-27: 40 meq via ORAL
  Filled 2021-01-27: qty 2

## 2021-01-27 MED ORDER — IBUPROFEN 400 MG PO TABS: 400.0000 mg | ORAL_TABLET | Freq: Three times a day (TID) | ORAL | 0 refills | Status: AC

## 2021-01-27 NOTE — ED Provider Notes (Signed)
[facility]PART[facility] Provider Note   CSN: [id number] Arrival date & time: 01/27/21  1023     History Chief Complaint  Patient presents with  . Abdominal Pain    Kathryn Thomas is a 80 y.o. female.  HPI Patient is an 80 year old female with a history of acute renal failure, hypertension, pernicious anemia, shortness of breath, cholecystectomy, who presents the emergency department due to nausea/vomiting/diarrhea.  Patient states that 3 to 4 days ago she began experiencing decreased appetite as well as energy.  She then began experiencing body aches that were worst in the lower extremities.  Last night she then began experiencing nausea/vomiting/diarrhea.  She states her symptoms have been intractable all night.  Denies any bloody emesis or diarrhea but states that she was not looking closely.  Denies any abdominal pain.  No fevers, URI symptoms, chest pain, shortness of breath, urinary complaints. Vaccinated for COVID x 3.     Past Medical History:  Diagnosis Date  . Acute renal failure ([place])   . Anal stenosis 2010  . Anxiety   . Blood transfusion    " no reaction to transfusion "  . Depression   . External hemorrhoids   . Gastric polyp   . Gastritis   . Hypertension   . Intestinal metaplasia of gastric mucosa 09/2011   high grade dysplasia  . Pernicious anemia   . Shortness of breath   . Vitamin B12 deficiency     Patient Active Problem List   Diagnosis Date Noted  . Abnormal cholangiogram   . Total bilirubin, elevated   . Acute cholecystitis 11/25/2019  . Dyspnea on exertion 06/10/2013  . Microscopic hematuria 06/10/2013  . Precordial pain 05/13/2013  . Anal stenosis   . Pernicious anemia 10/01/2011  . Multiple gastric polyps 10/01/2011  . B12 deficiency anemia 10/01/2011  . Anemia 09/28/2011  . Weight loss 09/28/2011  . Positional lightheadedness 09/28/2011  . UTI (lower urinary tract infection) 09/28/2011    Past Surgical History:   Procedure Laterality Date  . ABDOMINAL HYSTERECTOMY    . BACK SURGERY  1980   three lower spine surgeries.  . CHOLECYSTECTOMY N/A 11/26/2019   Procedure: LAPAROSCOPIC CHOLECYSTECTOMY WITH INTRAOPERATIVE CHOLANGIOGRAM;  Surgeon: Armandina Gemma, MD;  Location: [facility];  Service: General;  Laterality: N/A;  . ERCP N/A 11/27/2019   Procedure: ENDOSCOPIC RETROGRADE CHOLANGIOPANCREATOGRAPHY (ERCP);  Surgeon: Ladene Artist, MD;  Location: [facility];  Service: Endoscopy;  Laterality: N/A;  . ESOPHAGOGASTRODUODENOSCOPY  10/01/2011   Procedure: ESOPHAGOGASTRODUODENOSCOPY (EGD);  Surgeon: Lafayette Dragon, MD;  Location: [facility] ENDOSCOPY;  Service: Endoscopy;  Laterality: N/A;  . REMOVAL OF STONES  11/27/2019   Procedure: REMOVAL OF STONES;  Surgeon: Ladene Artist, MD;  Location: [facility] ENDOSCOPY;  Service: Endoscopy;;  . Joan Mayans  11/27/2019   Procedure: SPHINCTEROTOMY;  Surgeon: Ladene Artist, MD;  Location: [facility] ENDOSCOPY;  Service: Endoscopy;;     OB History   No obstetric history on file.     Family History  Problem Relation Age of Onset  . Cervical cancer Mother   . Heart disease Mother        died of CHF  . Heart disease Father        died at 11 of heart attack  . Multiple myeloma Sister        spleen involvement  . Cancer Sister        bone marrow  . Stroke Sister   . Colon cancer Neg Hx  Social History   Tobacco Use  . Smoking status: Never Smoker  . Smokeless tobacco: Never Used  Vaping Use  . Vaping Use: Never used  Substance Use Topics  . Alcohol use: No  . Drug use: No    Home Medications Prior to Admission medications   Medication Sig Start Date End Date Taking? Authorizing Provider  ibuprofen (ADVIL) 400 MG tablet Take 1 tablet (400 mg total) by mouth 3 (three) times daily for 7 days. 01/27/21 02/03/21 Yes Rayna Sexton, PA-C  ondansetron (ZOFRAN ODT) 4 MG disintegrating tablet Take 1 tablet (4 mg total) by mouth every 8 (eight) hours as needed for nausea or  vomiting. 01/27/21  Yes Rayna Sexton, PA-C  acetaminophen (TYLENOL) 325 MG tablet You can take 2 tablets every 4 hours as needed for pain.  Do not take more than 4000 mg of Tylenol per day.  It can harm your liver. You can buy this over the counter at any drug store. 11/29/19   Earnstine Regal, PA-C  ALPRAZolam Duanne Moron) 0.5 MG tablet Take 0.25 mg by mouth 2 (two) times daily as needed for anxiety. 10/27/19   [provider]  amLODipine (NORVASC) 2.5 MG tablet Take 2.5 mg by mouth daily. 09/04/19   [provider]  buPROPion (WELLBUTRIN SR) 150 MG 12 hr tablet Take 1 tablet (150 mg total) by mouth 2 (two) times daily. 02/20/14   Robyn Haber, MD  celecoxib (CELEBREX) 200 MG capsule Talk to your primary care before resuming this medicine.  Your kidney function was elevated here in the hospital. 11/29/19   Earnstine Regal, PA-C  docusate sodium (COLACE) 100 MG capsule Take 1 capsule (100 mg total) by mouth 2 (two) times daily as needed for mild constipation or moderate constipation (use as needed for constipation). 12/01/19   Earnstine Regal, PA-C  fluticasone [facility]) 50 MCG/ACT nasal spray Place 2 sprays into both nostrils daily. Patient not taking: Reported on 11/25/2019 12/28/13   Orma Flaming, MD  hydrochlorothiazide (MICROZIDE) 12.5 MG capsule Take 1 capsule (12.5 mg total) by mouth daily. 07/07/14   Harrison Mons, PA  indomethacin (INDOCIN) 25 MG capsule Take 1 capsule (25 mg total) by mouth 3 (three) times daily with meals. 12/01/19   Earnstine Regal, PA-C  lisinopril (PRINIVIL,ZESTRIL) 10 MG tablet TAKE ONE TABLET BY MOUTH ONCE DAILY Patient taking differently: Take 10 mg by mouth daily.  07/06/14   Robyn Haber, MD  oxyCODONE (OXY IR/ROXICODONE) 5 MG immediate release tablet Take 1-2 tablets (5-10 mg total) by mouth every 4 (four) hours as needed for severe pain or breakthrough pain (for pain not relieved by Tylenol). 11/29/19   Earnstine Regal, PA-C  polyethylene  glycol (MIRALAX / GLYCOLAX) 17 g packet Take 17 g by mouth daily as needed for mild constipation, moderate constipation or severe constipation (use as needed follow package directions). 12/01/19   Earnstine Regal, PA-C  Vitamin D, Ergocalciferol, (DRISDOL) 50000 UNITS CAPS capsule Take 50,000 Units by mouth every 7 (seven) days. On Wednesday.    [provider]    Allergies    Patient has no known allergies.  Review of Systems   Review of Systems  All other systems reviewed and are negative. Ten systems reviewed and are negative for acute change, except as noted in the HPI.   Physical Exam Updated Vital Signs BP (!) 154/72   Pulse 66   Temp 97.9 F (36.6 C) (Oral)   Resp 14   Ht 5' 7" (1.702 m)  Wt 114.2 kg   SpO2 97%   BMI 39.44 kg/m   Physical Exam Vitals and nursing note reviewed.  Constitutional:      General: She is not in acute distress.    Appearance: Normal appearance. She is well-developed. She is obese. She is not ill-appearing, toxic-appearing or diaphoretic.  HENT:     Head: Normocephalic and atraumatic.     Right Ear: External ear normal.     Left Ear: External ear normal.     Nose: Nose normal.     Mouth/Throat:     Mouth: Mucous membranes are moist.     Pharynx: Oropharynx is clear. No oropharyngeal exudate or posterior oropharyngeal erythema.  Eyes:     Extraocular Movements: Extraocular movements intact.  Cardiovascular:     Rate and Rhythm: Normal rate and regular rhythm.     Pulses: Normal pulses.     Heart sounds: Normal heart sounds. No murmur heard. No friction rub. No gallop.   Pulmonary:     Effort: Pulmonary effort is normal. No respiratory distress.     Breath sounds: Normal breath sounds. No stridor. No wheezing, rhonchi or rales.  Abdominal:     General: Abdomen is protuberant.     Palpations: Abdomen is soft.     Tenderness: There is generalized abdominal tenderness.     Comments: Protuberant abdomen that is soft.  Diffuse  tenderness appreciated throughout the abdomen that appears to be worst along the epigastrium and right upper quadrant.  Musculoskeletal:        General: Normal range of motion.     Cervical back: Normal range of motion and neck supple. No tenderness.  Skin:    General: Skin is warm and dry.  Neurological:     General: No focal deficit present.     Mental Status: She is alert and oriented to person, place, and time.  Psychiatric:        Mood and Affect: Mood normal.        Behavior: Behavior normal.    ED Results / Procedures / Treatments   Labs (all labs ordered are listed, but only abnormal results are displayed) Labs Reviewed  COMPREHENSIVE METABOLIC PANEL - Abnormal; Notable for the following components:      Result Value   Potassium 3.2 (*)    Glucose, Bld 151 (*)    Calcium 8.1 (*)    All other components within normal limits  CBC WITH DIFFERENTIAL/PLATELET - Abnormal; Notable for the following components:   RBC 5.27 (*)    Hemoglobin 15.1 (*)    All other components within normal limits  URINALYSIS, ROUTINE W REFLEX MICROSCOPIC - Abnormal; Notable for the following components:   APPearance HAZY (*)    Specific Gravity, Urine >1.030 (*)    Hgb urine dipstick MODERATE (*)    Bilirubin Urine SMALL (*)    Protein, ur >300 (*)    Nitrite POSITIVE (*)    All other components within normal limits  URINALYSIS, MICROSCOPIC (REFLEX) - Abnormal; Notable for the following components:   Bacteria, UA MANY (*)    All other components within normal limits  RESP PANEL BY RT-PCR (FLU A&B, COVID) ARPGX2  URINE CULTURE  LIPASE, BLOOD   EKG EKG Interpretation  Date/Time:  Saturday Jan 27 2021 11:34:42 EDT Ventricular Rate:  71 PR Interval:  192 QRS Duration: 98 QT Interval:  414 QTC Calculation: 450 R Axis:   2 Text Interpretation: Sinus rhythm Consider left atrial enlargement Nonspecific T abnormalities,  lateral leads Since last tracing pvcs have resolved Confirmed by Dorie Rank [phone]) on 01/27/2021 11:39:45 AM   Radiology CT ABDOMEN PELVIS W CONTRAST  Result Date: 01/27/2021 CLINICAL DATA:  Diffuse abdominal pain. EXAM: CT ABDOMEN AND PELVIS WITH CONTRAST TECHNIQUE: Multidetector CT imaging of the abdomen and pelvis was performed using the standard protocol following bolus administration of intravenous contrast. CONTRAST:  177m OMNIPAQUE IOHEXOL 300 MG/ML  SOLN COMPARISON:  CT abdomen pelvis dated 11/25/2019. FINDINGS: Lower chest: There is a minimal bibasilar atelectasis. Hepatobiliary: No focal liver abnormality is seen. Status post cholecystectomy. No biliary dilatation. Pancreas: Unremarkable. No pancreatic ductal dilatation or surrounding inflammatory changes. Spleen: Normal in size without focal abnormality. Adrenals/Urinary Tract: Adrenal glands are unremarkable. Kidneys are normal, without renal calculi, focal lesion, or hydronephrosis. Bladder is unremarkable. Stomach/Bowel: Stomach is within normal limits. A 2.2 cm fat density ovoid structure with a hyperattenuating a ring is seen along the anterior aspect of the descending colon (series 6, image 126) and is consistent with epiploic appendagitis. No significant associated fat stranding is noted and this is likely a subacute finding. Appendix appears normal. No evidence of bowel wall thickening or obstruction. Vascular/Lymphatic: Aortic atherosclerosis. No enlarged abdominal or pelvic lymph nodes. Reproductive: Status post hysterectomy. No adnexal masses. Other: No abdominal wall hernia or abnormality. No abdominopelvic ascites. Musculoskeletal: Degenerative changes are seen in the spine. IMPRESSION: Subacute epiploic appendagitis involving the descending colon. Electronically Signed   By: TZerita BoersM.D.   On: 01/27/2021 13:17   Procedures Procedures   Medications Ordered in ED Medications  potassium chloride SA (KLOR-CON) CR tablet 40 mEq (has no administration in time range)  sodium chloride 0.9 % bolus  1,000 mL (1,000 mLs Intravenous New Bag/Given 01/27/21 1157)  ondansetron (ZOFRAN) injection 4 mg (4 mg Intravenous Given 01/27/21 1200)  iohexol (OMNIPAQUE) 300 MG/ML solution 100 mL (100 mLs Intravenous Contrast Given 01/27/21 1234)   ED Course  I have reviewed the triage vital signs and the nursing notes.  Pertinent labs & imaging results that were available during my care of the patient were reviewed by me and considered in my medical decision making (see chart for details).    MDM Rules/Calculators/A&P                          Pt is a 80y.o. female who presents the emergency department with abdominal pain, nausea, vomiting, and diarrhea.  Labs: CBC with an elevated RBC and hemoglobin.  Likely hemoconcentration. CMP with a potassium of 3.2, glucose of 151, calcium of 8.1. Lipase of 19. Respiratory panel negative. UA shows elevated specific gravity, moderate hemoglobin, small bilirubin, greater than 300 protein, positive nitrites.  Culture sent.    Imaging: CT scan of the abdomen and pelvis shows subacute epiploic appendagitis.  I, LRayna Sexton PA-C, personally reviewed and evaluated these images and lab results as part of my medical decision-making.  Patient with diffuse tenderness along her abdomen as well as nausea, vomiting, and diarrhea that all started last night.  She had been experiencing decreased appetite and fatigue for the past few days prior to the onset of her symptoms last night.  CT scan concerning for subacute epiploic appendagitis.  She was given a dose of IV Zofran as well as a liter of IV fluids.  She reports significant relief of her symptoms.  She was given Klor-Con for mild hypokalemia and p.o. challenged successfully.  Feel that patient is stable for discharge  at this time and she is agreeable.  Will discharge on a course of ibuprofen.  Given a prescription for additional Zofran for breakthrough nausea and vomiting.  Recommended PCP follow-up.  Given strict  return precautions.  She verbalized understanding of the above plan.  Her questions were answered and she was amicable that the time of discharge.  Note: Portions of this report may have been transcribed using voice recognition software. Every effort was made to ensure accuracy; however, inadvertent computerized transcription errors may be present.   Final Clinical Impression(s) / ED Diagnoses Final diagnoses:  Epiploic appendagitis   Rx / DC Orders ED Discharge Orders         Ordered    ibuprofen (ADVIL) 400 MG tablet  3 times daily        01/27/21 1331    ondansetron (ZOFRAN ODT) 4 MG disintegrating tablet  Every 8 hours PRN        01/27/21 1331           Rayna Sexton, PA-C 01/27/21 1335    Dorie Rank, MD 01/28/21 [phone]

## 2021-01-27 NOTE — Discharge Instructions (Addendum)
It appears that your abdominal pain as well as your nausea/vomiting are due to a diagnosis called epiploic appendagitis.  This should resolve without any type of serious intervention.  This likely does not need any type of surgery.  I am going prescribe you 400 mg ibuprofen you can take 3 times per day for the next week.  Please take this medication with a small amount of food to prevent stomach irritation.  I will also prescribe you a short course of Zofran.  This is the same medication you were given in the emergency department for your nausea.  You can take this up to 3 times a day as needed for severe nausea and vomiting that you cannot control.  Please continue to monitor your symptoms closely.  I would recommend following up with your regular doctor but if your symptoms worsen, you need to come back to the emergency department so that you can be reevaluated.  It was a pleasure to meet you.

## 2021-01-27 NOTE — ED Notes (Signed)
ED Provider at bedside. 

## 2021-01-27 NOTE — ED Triage Notes (Addendum)
Onset for 3-4 days," No energy, appetite, weakness. Last night begin worsen. Ate small salad last night. Threwup afterwards. Experiencing dry heaves'

## 2021-01-29 LAB — URINE CULTURE: Culture: >=100000 — AB

## 2021-01-30 ENCOUNTER — Telehealth: Payer: Self-pay | Admitting: *Deleted

## 2021-01-30 NOTE — Telephone Encounter (Signed)
Post ED Visit - Positive Culture Follow-up  Culture report reviewed by antimicrobial stewardship pharmacist: [facility] Team []  Elenor Quinones, Pharm.D. []  Heide Guile, Pharm.D., BCPS AQ-ID []  Parks Neptune, Pharm.D., BCPS []  Alycia Rossetti, Pharm.D., BCPS []  [place], Pharm.D., BCPS, AAHIVP []  Legrand Como, Pharm.D., BCPS, AAHIVP []  Salome Arnt, PharmD, BCPS []  Johnnette Gourd, PharmD, BCPS []  Hughes Better, PharmD, BCPS []  Leeroy Cha, PharmD []  Laqueta Linden, PharmD, BCPS []  Albertina Parr, PharmD  [facility] Team []  Leodis Sias, PharmD []  Lindell Spar, PharmD []  Royetta Asal, PharmD []  Graylin Shiver, Rph []  Rema Fendt) Glennon Mac, PharmD []  Arlyn Dunning, PharmD []  Netta Cedars, PharmD []  Dia Sitter, PharmD []  Leone Haven, PharmD []  Gretta Arab, PharmD []  Theodis Shove, PharmD []  Peggyann Juba, PharmD []  Reuel Boom, PharmD   Positive urine culture Asymptomatic,  WBCs WNL, afebrile and no further patient follow-up is required at this time.  Fara Olden, PharmD  Harlon Flor Talley 01/30/2021, 10:16 AM

## 2021-04-03 IMAGING — CT CT ANGIOGRAPHY CHEST
1 of 9 series · 3 of 16 positions shown · IV contrast (omnipaque)
Comparison: Same day chest radiograph

CLINICAL DATA: One day history of shortness of breath, anxiety. No
history of blood clots. Nonsmoker.

EXAM:
CT ANGIOGRAPHY CHEST WITH CONTRAST
TECHNIQUE: Multidetector CT imaging of the chest was performed using the
standard protocol during bolus administration of intravenous
contrast. Multiplanar CT image reconstructions and MIPs were
obtained to evaluate the vascular anatomy.
CONTRAST:  77mL OMNIPAQUE IOHEXOL 350 MG/ML SOLN

[Series 5: pe thins · axial · 0.84mm/px · z∈[-224,+114]mm · 3 of 339 slices shown]
[im 1/339  lung]
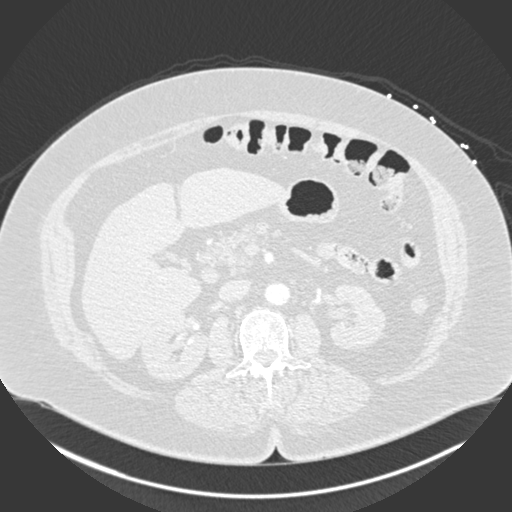
[im 170/339  soft-tissue]
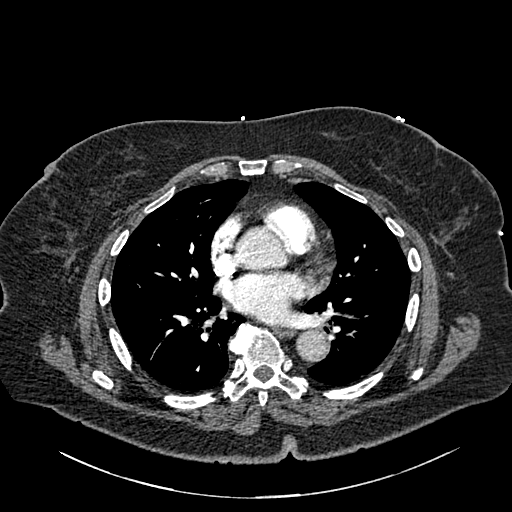
[im 339/339  lung]
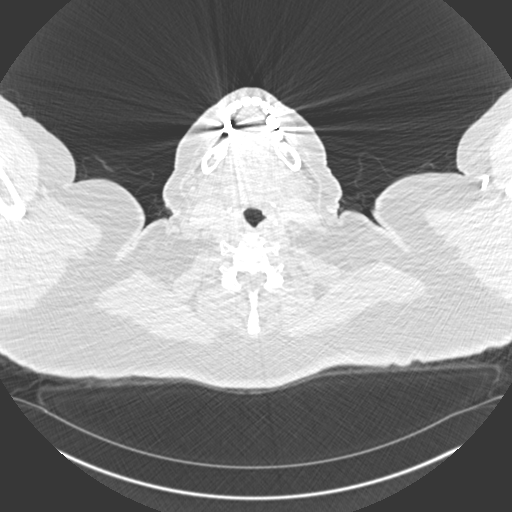

[3 of 16 positions shown; findings below may reference images not displayed]

FINDINGS: Cardiovascular: Satisfactory opacification of the pulmonary arteries
to the segmental level. No evidence of pulmonary embolism. No
pulmonary arterial enlargement. Normal heart size. No pericardial
effusion. Atherosclerotic plaque within the normal caliber aorta.

Mediastinum/Nodes: No enlarged mediastinal or axillary lymph nodes.
Thyroid gland, trachea, and esophagus demonstrate no significant
findings.

Lungs/Pleura: Mosaic areas of attenuation of the lungs are likely
related to a combination imaging during exhalation and mild small
airways disease. 6 mm subpleural nodule is present in the posterior
basal segment of the left lower lobe (axial series 6 image 68). Few
pulmonary cysts are present. No pneumothorax. No effusion. Dependent
atelectasis posteriorly.

Upper Abdomen: No acute abnormalities present in the visualized
portions of the upper abdomen. Fluid attenuation cyst in the left
kidney. Vascular calcifications in the upper abdomen.

Musculoskeletal: Multilevel degenerative changes are present in the
imaged portions of the spine. No acute osseous abnormality or
suspicious osseous lesion.

Review of the MIP images confirms the above findings.
IMPRESSION: No acute pulmonary embolism is seen to the segmental level.

Features suggesting at least mild small airways disease.

6 mm subpleural nodule the left lower lobe. Non-contrast chest CT at
6-12 months is recommended. If the nodule is stable at time of
repeat CT, then future CT at 18-24 months (from today's scan) is
considered optional for low-risk patients, but is recommended for
high-risk patients. This recommendation follows the consensus
statement: Guidelines for Management of Incidental Pulmonary Nodules
Detected on CT Images: From the [HOSPITAL] 2413; Radiology

Aortic Atherosclerosis ([id number].[email or web address]).

## 2021-08-30 DIAGNOSIS — E119 Type 2 diabetes mellitus without complications: Secondary | ICD-10-CM | POA: Diagnosis not present

## 2021-08-30 DIAGNOSIS — G5790 Unspecified mononeuropathy of unspecified lower limb: Secondary | ICD-10-CM | POA: Diagnosis not present

## 2021-08-30 DIAGNOSIS — M79604 Pain in right leg: Secondary | ICD-10-CM | POA: Diagnosis not present

## 2021-08-30 DIAGNOSIS — M5441 Lumbago with sciatica, right side: Secondary | ICD-10-CM | POA: Diagnosis not present

## 2021-08-30 DIAGNOSIS — R609 Edema, unspecified: Secondary | ICD-10-CM | POA: Diagnosis not present

## 2021-08-31 DIAGNOSIS — Z0001 Encounter for general adult medical examination with abnormal findings: Secondary | ICD-10-CM | POA: Diagnosis not present

## 2021-08-31 DIAGNOSIS — E782 Mixed hyperlipidemia: Secondary | ICD-10-CM | POA: Diagnosis not present

## 2021-08-31 DIAGNOSIS — R69 Illness, unspecified: Secondary | ICD-10-CM | POA: Diagnosis not present

## 2021-08-31 DIAGNOSIS — M25561 Pain in right knee: Secondary | ICD-10-CM | POA: Diagnosis not present

## 2021-08-31 DIAGNOSIS — F419 Anxiety disorder, unspecified: Secondary | ICD-10-CM | POA: Diagnosis not present

## 2021-08-31 DIAGNOSIS — I10 Essential (primary) hypertension: Secondary | ICD-10-CM | POA: Diagnosis not present

## 2021-08-31 DIAGNOSIS — E559 Vitamin D deficiency, unspecified: Secondary | ICD-10-CM | POA: Diagnosis not present

## 2021-08-31 DIAGNOSIS — E1165 Type 2 diabetes mellitus with hyperglycemia: Secondary | ICD-10-CM | POA: Diagnosis not present

## 2021-08-31 DIAGNOSIS — M5441 Lumbago with sciatica, right side: Secondary | ICD-10-CM | POA: Diagnosis not present

## 2021-08-31 DIAGNOSIS — Z789 Other specified health status: Secondary | ICD-10-CM | POA: Diagnosis not present

## 2021-09-03 DIAGNOSIS — R609 Edema, unspecified: Secondary | ICD-10-CM | POA: Diagnosis not present

## 2021-09-03 DIAGNOSIS — I10 Essential (primary) hypertension: Secondary | ICD-10-CM | POA: Diagnosis not present

## 2021-09-03 DIAGNOSIS — E1165 Type 2 diabetes mellitus with hyperglycemia: Secondary | ICD-10-CM | POA: Diagnosis not present

## 2021-09-03 DIAGNOSIS — E782 Mixed hyperlipidemia: Secondary | ICD-10-CM | POA: Diagnosis not present

## 2021-10-23 ENCOUNTER — Other Ambulatory Visit: Payer: Self-pay

## 2021-10-23 ENCOUNTER — Encounter (HOSPITAL_BASED_OUTPATIENT_CLINIC_OR_DEPARTMENT_OTHER): Payer: Self-pay

## 2021-10-23 ENCOUNTER — Emergency Department (HOSPITAL_BASED_OUTPATIENT_CLINIC_OR_DEPARTMENT_OTHER)
Admission: EM | Admit: 2021-10-23 | Discharge: 2021-10-23 | Disposition: A | Discharge status: Home / Self Care | Payer: Medicare HMO | Attending: Emergency Medicine | Admitting: Emergency Medicine

## 2021-10-23 DIAGNOSIS — Z79899 Other long term (current) drug therapy: Secondary | ICD-10-CM | POA: Insufficient documentation

## 2021-10-23 DIAGNOSIS — M549 Dorsalgia, unspecified: Secondary | ICD-10-CM | POA: Diagnosis present

## 2021-10-23 DIAGNOSIS — M5441 Lumbago with sciatica, right side: Secondary | ICD-10-CM | POA: Diagnosis not present

## 2021-10-23 DIAGNOSIS — G8929 Other chronic pain: Secondary | ICD-10-CM | POA: Diagnosis not present

## 2021-10-23 HISTORY — DX: Type 2 diabetes mellitus without complications: E11.9

## 2021-10-23 MED ORDER — GABAPENTIN 300 MG PO CAPS: 300.0000 mg | ORAL_CAPSULE | Freq: Three times a day (TID) | ORAL | 0 refills | Status: DC | PRN

## 2021-10-23 MED ORDER — DEXAMETHASONE SODIUM PHOSPHATE 10 MG/ML IJ SOLN
8.0000 mg | Freq: Once | INTRAMUSCULAR | Status: AC
  Administered 2021-10-23: 8 mg via INTRAMUSCULAR
  Filled 2021-10-23: qty 1

## 2021-10-23 MED ORDER — GABAPENTIN 100 MG PO CAPS
400.0000 mg | ORAL_CAPSULE | Freq: Once | ORAL | Status: AC
  Administered 2021-10-23: 400 mg via ORAL
  Filled 2021-10-23: qty 4

## 2021-10-23 NOTE — Discharge Instructions (Addendum)
Please follow-up with your doctor this week for your back pain.  You may need an outpatient MRI of your lower back or lumbar spine to see if there are problems with the nerves down there.  You can also call to make a new appointment at the [facility] spinal surgery clinic at the number above.  You will need to call them to set up an appointment.  We gave you a shot of steroids in the ER to help with the inflammation in your back.  I also started you on a medicine called gabapentin, which is a nerve pain type medicine.  You can take this up to 3 times a day as needed.  Please do not take this at the same time as Norco, which is a narcotic.  These medications taken together can make you feel very dizzy, and put you at risk for falling.

## 2021-10-23 NOTE — ED Notes (Signed)
ED Provider at bedside. 

## 2021-10-23 NOTE — ED Triage Notes (Signed)
Pt reports back pain that radiates right leg/thigh x one month. No injury reported. Has hx of back sx

## 2021-10-23 NOTE — ED Provider Notes (Signed)
[facility] EMERGENCY DEPARTMENT Provider Note   CSN: [id number] Arrival date & time: 10/23/21  2637     History  Chief Complaint  Patient presents with   Back Pain    Kathryn Thomas is a 81 y.o. female present emerged department with pain in her back that radiates down her right leg.  She has had chronic pain for many years, reports a history of spinal surgery "many years ago" in her lumbar spine (cannot recall the specific details).  She feels the pain has been getting worse recently.  Her PCP prescribed Norco which she has taken 2 doses left, with little relief.  She does feel that ibuprofen does help more.  She presents to the ED because the pain is still significant.  She has been walking with a cane recently.  No falls or recent trauma.  She lives with her daughter several days of the week, but then daughter goes home on the weekends.  HPI     Home Medications Prior to Admission medications   Medication Sig Start Date End Date Taking? Authorizing Provider  gabapentin (NEURONTIN) 300 MG capsule Take 1 capsule (300 mg total) by mouth 3 (three) times daily as needed for up to 30 doses. 10/23/21  Yes Joahan Swatzell, Carola Rhine, MD  acetaminophen (TYLENOL) 325 MG tablet You can take 2 tablets every 4 hours as needed for pain.  Do not take more than 4000 mg of Tylenol per day.  It can harm your liver. You can buy this over the counter at any drug store. 11/29/19   Earnstine Regal, PA-C  ALPRAZolam Duanne Moron) 0.5 MG tablet Take 0.25 mg by mouth 2 (two) times daily as needed for anxiety. 10/27/19   [provider]  amLODipine (NORVASC) 2.5 MG tablet Take 2.5 mg by mouth daily. 09/04/19   [provider]  buPROPion (WELLBUTRIN SR) 150 MG 12 hr tablet Take 1 tablet (150 mg total) by mouth 2 (two) times daily. 02/20/14   Robyn Haber, MD  celecoxib (CELEBREX) 200 MG capsule Talk to your primary care before resuming this medicine.  Your kidney function was elevated here in  the hospital. 11/29/19   Earnstine Regal, PA-C  docusate sodium (COLACE) 100 MG capsule Take 1 capsule (100 mg total) by mouth 2 (two) times daily as needed for mild constipation or moderate constipation (use as needed for constipation). 12/01/19   Earnstine Regal, PA-C  fluticasone [facility]) 50 MCG/ACT nasal spray Place 2 sprays into both nostrils daily. Patient not taking: Reported on 11/25/2019 12/28/13   Orma Flaming, MD  hydrochlorothiazide (MICROZIDE) 12.5 MG capsule Take 1 capsule (12.5 mg total) by mouth daily. 07/07/14   Harrison Mons, PA  indomethacin (INDOCIN) 25 MG capsule Take 1 capsule (25 mg total) by mouth 3 (three) times daily with meals. 12/01/19   Earnstine Regal, PA-C  lisinopril (PRINIVIL,ZESTRIL) 10 MG tablet TAKE ONE TABLET BY MOUTH ONCE DAILY Patient taking differently: Take 10 mg by mouth daily.  07/06/14   Robyn Haber, MD  ondansetron (ZOFRAN ODT) 4 MG disintegrating tablet Take 1 tablet (4 mg total) by mouth every 8 (eight) hours as needed for nausea or vomiting. 01/27/21   Rayna Sexton, PA-C  oxyCODONE (OXY IR/ROXICODONE) 5 MG immediate release tablet Take 1-2 tablets (5-10 mg total) by mouth every 4 (four) hours as needed for severe pain or breakthrough pain (for pain not relieved by Tylenol). 11/29/19   Earnstine Regal, PA-C  polyethylene glycol (MIRALAX / GLYCOLAX) 17 g packet Take  17 g by mouth daily as needed for mild constipation, moderate constipation or severe constipation (use as needed follow package directions). 12/01/19   Earnstine Regal, PA-C  Vitamin D, Ergocalciferol, (DRISDOL) 50000 UNITS CAPS capsule Take 50,000 Units by mouth every 7 (seven) days. On Wednesday.    [provider]      Allergies    Patient has no known allergies.    Review of Systems   Review of Systems  Physical Exam Updated Vital Signs BP (!) 178/91    Pulse 76    Temp 98.6 F (37 C)    Resp 18    Ht 5\' 7"  (1.702 m)    Wt 108.9 kg    SpO2 97%    BMI 37.59 kg/m   Physical Exam Constitutional:      General: She is not in acute distress. HENT:     Head: Normocephalic and atraumatic.  Eyes:     Conjunctiva/sclera: Conjunctivae normal.     Pupils: Pupils are equal, round, and reactive to light.  Cardiovascular:     Rate and Rhythm: Normal rate and regular rhythm.  Pulmonary:     Effort: Pulmonary effort is normal. No respiratory distress.  Musculoskeletal:     Comments: Positive straight leg test on the right side  Skin:    General: Skin is warm and dry.  Neurological:     General: No focal deficit present.     Mental Status: She is alert and oriented to person, place, and time. Mental status is at baseline.     Sensory: No sensory deficit.  Psychiatric:        Mood and Affect: Mood normal.        Behavior: Behavior normal.    ED Results / Procedures / Treatments   Labs (all labs ordered are listed, but only abnormal results are displayed) Labs Reviewed - No data to display  EKG None  Radiology No results found.  Procedures Procedures    Medications Ordered in ED Medications  dexamethasone (DECADRON) injection 8 mg (8 mg Intramuscular Given 10/23/21 1135)  gabapentin (NEURONTIN) capsule 400 mg (400 mg Oral Given 10/23/21 1133)    ED Course/ Medical Decision Making/ A&P                           Medical Decision Making Risk Prescription drug management.   Patient is here with symptoms that are consistent with radiculopathy, radiating down into the L4-L5 and S1 type dermatomes.  She does not have any objective muscle weakness on exam.  I do not see red flags for cauda equina syndrome.  I suspect this may be related to some radiculopathy or disc herniation of the lower back.  She is able to ambulate here.  We will give her an IM dose of Decadron and start her on gabapentin, otherwise she can continue the Motrin that she has at home.  I warned her not to take gabapentin at the same time as Norco as this may be over sedating.  I  advised that she follow-up with her PCP to discuss the possibility of an outpatient MRI of her lower back.  I also provided her the office information for the spine clinic.  She verbalized understanding and agreement with the plan.  At this time, given no mechanism of trauma or falls, I do not believe that x-ray or radiology imaging is warranted in the ED.        Final  Clinical Impression(s) / ED Diagnoses Final diagnoses:  Chronic right-sided low back pain with right-sided sciatica    Rx / DC Orders ED Discharge Orders          Ordered    gabapentin (NEURONTIN) 300 MG capsule  3 times daily PRN        10/23/21 1121              Wyvonnia Dusky, MD 10/23/21 1642

## 2021-10-26 ENCOUNTER — Other Ambulatory Visit: Payer: Self-pay | Admitting: Emergency Medicine

## 2021-10-26 DIAGNOSIS — M545 Low back pain, unspecified: Secondary | ICD-10-CM | POA: Diagnosis not present

## 2021-10-26 DIAGNOSIS — M5416 Radiculopathy, lumbar region: Secondary | ICD-10-CM | POA: Diagnosis not present

## 2021-10-26 DIAGNOSIS — G603 Idiopathic progressive neuropathy: Secondary | ICD-10-CM | POA: Diagnosis not present

## 2021-10-30 ENCOUNTER — Ambulatory Visit
Admission: RE | Admit: 2021-10-30 | Discharge: 2021-10-30 | Disposition: A | Discharge status: Home / Self Care | Payer: Medicare HMO | Source: Ambulatory Visit | Attending: Emergency Medicine | Admitting: Emergency Medicine

## 2021-10-30 ENCOUNTER — Other Ambulatory Visit: Payer: Self-pay

## 2021-10-30 DIAGNOSIS — M48061 Spinal stenosis, lumbar region without neurogenic claudication: Secondary | ICD-10-CM | POA: Diagnosis not present

## 2021-10-30 DIAGNOSIS — M545 Low back pain, unspecified: Secondary | ICD-10-CM | POA: Diagnosis not present

## 2021-10-30 DIAGNOSIS — M5416 Radiculopathy, lumbar region: Secondary | ICD-10-CM

## 2021-10-31 DIAGNOSIS — M5106 Intervertebral disc disorders with myelopathy, lumbar region: Secondary | ICD-10-CM | POA: Diagnosis not present

## 2021-10-31 DIAGNOSIS — M479 Spondylosis, unspecified: Secondary | ICD-10-CM | POA: Diagnosis not present

## 2021-10-31 DIAGNOSIS — M5416 Radiculopathy, lumbar region: Secondary | ICD-10-CM | POA: Diagnosis not present

## 2021-11-01 DIAGNOSIS — E1165 Type 2 diabetes mellitus with hyperglycemia: Secondary | ICD-10-CM | POA: Diagnosis not present

## 2021-11-01 DIAGNOSIS — E782 Mixed hyperlipidemia: Secondary | ICD-10-CM | POA: Diagnosis not present

## 2021-11-01 DIAGNOSIS — I10 Essential (primary) hypertension: Secondary | ICD-10-CM | POA: Diagnosis not present

## 2021-11-01 DIAGNOSIS — D72829 Elevated white blood cell count, unspecified: Secondary | ICD-10-CM | POA: Diagnosis not present

## 2021-11-16 DIAGNOSIS — M532X6 Spinal instabilities, lumbar region: Secondary | ICD-10-CM | POA: Diagnosis not present

## 2021-11-16 DIAGNOSIS — R0602 Shortness of breath: Secondary | ICD-10-CM | POA: Diagnosis not present

## 2021-11-16 DIAGNOSIS — Z01818 Encounter for other preprocedural examination: Secondary | ICD-10-CM | POA: Diagnosis not present

## 2021-11-16 DIAGNOSIS — M5106 Intervertebral disc disorders with myelopathy, lumbar region: Secondary | ICD-10-CM | POA: Diagnosis not present

## 2021-11-16 DIAGNOSIS — M48062 Spinal stenosis, lumbar region with neurogenic claudication: Secondary | ICD-10-CM | POA: Diagnosis not present

## 2021-11-19 DIAGNOSIS — R609 Edema, unspecified: Secondary | ICD-10-CM | POA: Diagnosis not present

## 2021-11-19 DIAGNOSIS — R9431 Abnormal electrocardiogram [ECG] [EKG]: Secondary | ICD-10-CM | POA: Diagnosis not present

## 2021-11-19 DIAGNOSIS — L03115 Cellulitis of right lower limb: Secondary | ICD-10-CM | POA: Diagnosis not present

## 2021-11-19 DIAGNOSIS — M5137 Other intervertebral disc degeneration, lumbosacral region: Secondary | ICD-10-CM | POA: Diagnosis not present

## 2021-11-20 DIAGNOSIS — I493 Ventricular premature depolarization: Secondary | ICD-10-CM | POA: Diagnosis not present

## 2021-11-29 DIAGNOSIS — L03115 Cellulitis of right lower limb: Secondary | ICD-10-CM | POA: Diagnosis not present

## 2021-11-29 DIAGNOSIS — L03116 Cellulitis of left lower limb: Secondary | ICD-10-CM | POA: Diagnosis not present

## 2021-12-06 ENCOUNTER — Other Ambulatory Visit: Payer: Self-pay | Admitting: Emergency Medicine

## 2021-12-06 ENCOUNTER — Ambulatory Visit
Admission: RE | Admit: 2021-12-06 | Discharge: 2021-12-06 | Disposition: A | Discharge status: Home / Self Care | Payer: Medicare HMO | Source: Ambulatory Visit | Attending: Emergency Medicine | Admitting: Emergency Medicine

## 2021-12-06 DIAGNOSIS — M5106 Intervertebral disc disorders with myelopathy, lumbar region: Secondary | ICD-10-CM | POA: Diagnosis not present

## 2021-12-06 DIAGNOSIS — R609 Edema, unspecified: Secondary | ICD-10-CM | POA: Diagnosis not present

## 2021-12-06 DIAGNOSIS — M48062 Spinal stenosis, lumbar region with neurogenic claudication: Secondary | ICD-10-CM | POA: Diagnosis not present

## 2021-12-12 DIAGNOSIS — R600 Localized edema: Secondary | ICD-10-CM | POA: Diagnosis not present

## 2021-12-12 DIAGNOSIS — M48062 Spinal stenosis, lumbar region with neurogenic claudication: Secondary | ICD-10-CM | POA: Diagnosis not present

## 2021-12-12 DIAGNOSIS — Z01810 Encounter for preprocedural cardiovascular examination: Secondary | ICD-10-CM | POA: Diagnosis not present

## 2021-12-25 DIAGNOSIS — J301 Allergic rhinitis due to pollen: Secondary | ICD-10-CM | POA: Diagnosis not present

## 2021-12-25 DIAGNOSIS — I10 Essential (primary) hypertension: Secondary | ICD-10-CM | POA: Diagnosis not present

## 2021-12-25 DIAGNOSIS — Z0001 Encounter for general adult medical examination with abnormal findings: Secondary | ICD-10-CM | POA: Diagnosis not present

## 2021-12-25 DIAGNOSIS — E1165 Type 2 diabetes mellitus with hyperglycemia: Secondary | ICD-10-CM | POA: Diagnosis not present

## 2021-12-25 DIAGNOSIS — M5441 Lumbago with sciatica, right side: Secondary | ICD-10-CM | POA: Diagnosis not present

## 2021-12-25 DIAGNOSIS — E782 Mixed hyperlipidemia: Secondary | ICD-10-CM | POA: Diagnosis not present

## 2021-12-25 DIAGNOSIS — E559 Vitamin D deficiency, unspecified: Secondary | ICD-10-CM | POA: Diagnosis not present

## 2022-01-25 DIAGNOSIS — M48062 Spinal stenosis, lumbar region with neurogenic claudication: Secondary | ICD-10-CM | POA: Diagnosis not present

## 2022-01-25 DIAGNOSIS — I10 Essential (primary) hypertension: Secondary | ICD-10-CM | POA: Diagnosis not present

## 2022-01-25 DIAGNOSIS — M5106 Intervertebral disc disorders with myelopathy, lumbar region: Secondary | ICD-10-CM | POA: Diagnosis not present

## 2022-01-25 DIAGNOSIS — Z79899 Other long term (current) drug therapy: Secondary | ICD-10-CM | POA: Diagnosis not present

## 2022-01-25 DIAGNOSIS — G894 Chronic pain syndrome: Secondary | ICD-10-CM | POA: Diagnosis not present

## 2022-01-25 DIAGNOSIS — Z79891 Long term (current) use of opiate analgesic: Secondary | ICD-10-CM | POA: Diagnosis not present

## 2022-01-25 DIAGNOSIS — M532X6 Spinal instabilities, lumbar region: Secondary | ICD-10-CM | POA: Diagnosis not present

## 2022-02-04 DIAGNOSIS — E785 Hyperlipidemia, unspecified: Secondary | ICD-10-CM | POA: Diagnosis not present

## 2022-02-04 DIAGNOSIS — E119 Type 2 diabetes mellitus without complications: Secondary | ICD-10-CM | POA: Diagnosis not present

## 2022-02-04 DIAGNOSIS — Z5309 Procedure and treatment not carried out because of other contraindication: Secondary | ICD-10-CM | POA: Diagnosis not present

## 2022-02-04 DIAGNOSIS — I10 Essential (primary) hypertension: Secondary | ICD-10-CM | POA: Diagnosis not present

## 2022-02-04 DIAGNOSIS — R600 Localized edema: Secondary | ICD-10-CM | POA: Diagnosis not present

## 2022-02-04 DIAGNOSIS — M532X6 Spinal instabilities, lumbar region: Secondary | ICD-10-CM | POA: Diagnosis not present

## 2022-02-04 DIAGNOSIS — M5106 Intervertebral disc disorders with myelopathy, lumbar region: Secondary | ICD-10-CM | POA: Diagnosis not present

## 2022-02-04 DIAGNOSIS — I517 Cardiomegaly: Secondary | ICD-10-CM | POA: Diagnosis not present

## 2022-02-04 DIAGNOSIS — M48062 Spinal stenosis, lumbar region with neurogenic claudication: Secondary | ICD-10-CM | POA: Diagnosis not present

## 2022-02-04 DIAGNOSIS — Z539 Procedure and treatment not carried out, unspecified reason: Secondary | ICD-10-CM | POA: Diagnosis not present

## 2022-02-04 DIAGNOSIS — I9788 Other intraoperative complications of the circulatory system, not elsewhere classified: Secondary | ICD-10-CM | POA: Diagnosis not present

## 2022-02-04 DIAGNOSIS — I499 Cardiac arrhythmia, unspecified: Secondary | ICD-10-CM | POA: Diagnosis not present

## 2022-02-04 DIAGNOSIS — I493 Ventricular premature depolarization: Secondary | ICD-10-CM | POA: Diagnosis not present

## 2022-02-04 DIAGNOSIS — Z79899 Other long term (current) drug therapy: Secondary | ICD-10-CM | POA: Diagnosis not present

## 2022-02-04 DIAGNOSIS — M4716 Other spondylosis with myelopathy, lumbar region: Secondary | ICD-10-CM | POA: Diagnosis not present

## 2022-02-20 DIAGNOSIS — I499 Cardiac arrhythmia, unspecified: Secondary | ICD-10-CM | POA: Diagnosis not present

## 2022-02-25 DIAGNOSIS — I471 Supraventricular tachycardia: Secondary | ICD-10-CM | POA: Diagnosis not present

## 2022-02-25 DIAGNOSIS — I4891 Unspecified atrial fibrillation: Secondary | ICD-10-CM | POA: Diagnosis not present

## 2022-03-20 DIAGNOSIS — I480 Paroxysmal atrial fibrillation: Secondary | ICD-10-CM | POA: Diagnosis not present

## 2022-03-20 DIAGNOSIS — I471 Supraventricular tachycardia: Secondary | ICD-10-CM | POA: Diagnosis not present

## 2022-03-20 DIAGNOSIS — I493 Ventricular premature depolarization: Secondary | ICD-10-CM | POA: Diagnosis not present

## 2022-04-15 DIAGNOSIS — I10 Essential (primary) hypertension: Secondary | ICD-10-CM | POA: Diagnosis not present

## 2022-04-15 DIAGNOSIS — R001 Bradycardia, unspecified: Secondary | ICD-10-CM | POA: Diagnosis not present

## 2022-04-15 DIAGNOSIS — E782 Mixed hyperlipidemia: Secondary | ICD-10-CM | POA: Diagnosis not present

## 2022-04-15 DIAGNOSIS — E559 Vitamin D deficiency, unspecified: Secondary | ICD-10-CM | POA: Diagnosis not present

## 2022-04-15 DIAGNOSIS — J301 Allergic rhinitis due to pollen: Secondary | ICD-10-CM | POA: Diagnosis not present

## 2022-04-15 DIAGNOSIS — R69 Illness, unspecified: Secondary | ICD-10-CM | POA: Diagnosis not present

## 2022-04-15 DIAGNOSIS — N179 Acute kidney failure, unspecified: Secondary | ICD-10-CM | POA: Diagnosis not present

## 2022-04-15 DIAGNOSIS — E1165 Type 2 diabetes mellitus with hyperglycemia: Secondary | ICD-10-CM | POA: Diagnosis not present

## 2022-04-21 ENCOUNTER — Emergency Department (HOSPITAL_BASED_OUTPATIENT_CLINIC_OR_DEPARTMENT_OTHER): Payer: Medicare HMO

## 2022-04-21 ENCOUNTER — Encounter (HOSPITAL_BASED_OUTPATIENT_CLINIC_OR_DEPARTMENT_OTHER): Payer: Self-pay | Admitting: Emergency Medicine

## 2022-04-21 ENCOUNTER — Observation Stay (HOSPITAL_BASED_OUTPATIENT_CLINIC_OR_DEPARTMENT_OTHER)
Admission: EM | Admit: 2022-04-21 | Discharge: 2022-04-23 | Disposition: A | Discharge status: Home Health | Payer: Medicare HMO | Attending: Internal Medicine | Admitting: Internal Medicine

## 2022-04-21 DIAGNOSIS — Z7984 Long term (current) use of oral hypoglycemic drugs: Secondary | ICD-10-CM | POA: Insufficient documentation

## 2022-04-21 DIAGNOSIS — Z79899 Other long term (current) drug therapy: Secondary | ICD-10-CM | POA: Diagnosis not present

## 2022-04-21 DIAGNOSIS — I4891 Unspecified atrial fibrillation: Secondary | ICD-10-CM | POA: Diagnosis not present

## 2022-04-21 DIAGNOSIS — N1831 Chronic kidney disease, stage 3a: Secondary | ICD-10-CM | POA: Insufficient documentation

## 2022-04-21 DIAGNOSIS — E1169 Type 2 diabetes mellitus with other specified complication: Secondary | ICD-10-CM | POA: Diagnosis present

## 2022-04-21 DIAGNOSIS — I10 Essential (primary) hypertension: Secondary | ICD-10-CM | POA: Diagnosis present

## 2022-04-21 DIAGNOSIS — M19012 Primary osteoarthritis, left shoulder: Secondary | ICD-10-CM | POA: Diagnosis not present

## 2022-04-21 DIAGNOSIS — R7989 Other specified abnormal findings of blood chemistry: Secondary | ICD-10-CM

## 2022-04-21 DIAGNOSIS — F419 Anxiety disorder, unspecified: Secondary | ICD-10-CM | POA: Diagnosis present

## 2022-04-21 DIAGNOSIS — E876 Hypokalemia: Secondary | ICD-10-CM | POA: Diagnosis not present

## 2022-04-21 DIAGNOSIS — E1122 Type 2 diabetes mellitus with diabetic chronic kidney disease: Secondary | ICD-10-CM | POA: Diagnosis not present

## 2022-04-21 DIAGNOSIS — R778 Other specified abnormalities of plasma proteins: Secondary | ICD-10-CM | POA: Insufficient documentation

## 2022-04-21 DIAGNOSIS — M48061 Spinal stenosis, lumbar region without neurogenic claudication: Secondary | ICD-10-CM | POA: Diagnosis not present

## 2022-04-21 DIAGNOSIS — E785 Hyperlipidemia, unspecified: Secondary | ICD-10-CM | POA: Diagnosis present

## 2022-04-21 DIAGNOSIS — Z20822 Contact with and (suspected) exposure to covid-19: Secondary | ICD-10-CM | POA: Diagnosis not present

## 2022-04-21 DIAGNOSIS — I700 Atherosclerosis of aorta: Secondary | ICD-10-CM | POA: Diagnosis not present

## 2022-04-21 DIAGNOSIS — I129 Hypertensive chronic kidney disease with stage 1 through stage 4 chronic kidney disease, or unspecified chronic kidney disease: Secondary | ICD-10-CM | POA: Insufficient documentation

## 2022-04-21 DIAGNOSIS — I482 Chronic atrial fibrillation, unspecified: Secondary | ICD-10-CM | POA: Diagnosis not present

## 2022-04-21 DIAGNOSIS — M79602 Pain in left arm: Secondary | ICD-10-CM | POA: Diagnosis not present

## 2022-04-21 DIAGNOSIS — Z7901 Long term (current) use of anticoagulants: Secondary | ICD-10-CM | POA: Diagnosis not present

## 2022-04-21 DIAGNOSIS — N179 Acute kidney failure, unspecified: Secondary | ICD-10-CM | POA: Insufficient documentation

## 2022-04-21 DIAGNOSIS — R630 Anorexia: Secondary | ICD-10-CM | POA: Insufficient documentation

## 2022-04-21 DIAGNOSIS — M79622 Pain in left upper arm: Secondary | ICD-10-CM | POA: Diagnosis not present

## 2022-04-21 DIAGNOSIS — R202 Paresthesia of skin: Secondary | ICD-10-CM | POA: Diagnosis not present

## 2022-04-21 DIAGNOSIS — K317 Polyp of stomach and duodenum: Secondary | ICD-10-CM | POA: Diagnosis present

## 2022-04-21 DIAGNOSIS — M7989 Other specified soft tissue disorders: Secondary | ICD-10-CM | POA: Diagnosis not present

## 2022-04-21 HISTORY — DX: Hyperlipidemia, unspecified: E78.5

## 2022-04-21 HISTORY — DX: Unspecified atrial fibrillation: I48.91

## 2022-04-21 LAB — BASIC METABOLIC PANEL
Anion gap: 8 (ref 5–15)
BUN: 11 mg/dL (ref 8–23)
CO2: 30 mmol/L (ref 22–32)
Calcium: 8.7 mg/dL — ABNORMAL LOW (ref 8.9–10.3)
Chloride: 101 mmol/L (ref 98–111)
Creatinine, Ser: 1 mg/dL (ref 0.44–1.00)
GFR, Estimated: 57 mL/min — ABNORMAL LOW (ref >60)
Glucose, Bld: 180 mg/dL — ABNORMAL HIGH (ref 70–99)
Potassium: 2.8 mmol/L — ABNORMAL LOW (ref 3.5–5.1)
Sodium: 139 mmol/L (ref 135–145)

## 2022-04-21 LAB — CBC WITH DIFFERENTIAL/PLATELET
Abs Immature Granulocytes: 0.07 10*3/uL (ref 0.00–0.07)
Basophils Absolute: 0 10*3/uL (ref 0.0–0.1)
Basophils Relative: 0 %
Eosinophils Absolute: 0 10*3/uL (ref 0.0–0.5)
Eosinophils Relative: 0 %
HCT: 46.5 % — ABNORMAL HIGH (ref 36.0–46.0)
Hemoglobin: 15.3 g/dL — ABNORMAL HIGH (ref 12.0–15.0)
Immature Granulocytes: 1 %
Lymphocytes Relative: 12 %
Lymphs Abs: 1.8 10*3/uL (ref 0.7–4.0)
MCH: 29 pg (ref 26.0–34.0)
MCHC: 32.9 g/dL (ref 30.0–36.0)
MCV: 88.1 fL (ref 80.0–100.0)
Monocytes Absolute: 1.2 10*3/uL — ABNORMAL HIGH (ref 0.1–1.0)
Monocytes Relative: 8 %
Neutro Abs: 11.9 10*3/uL — ABNORMAL HIGH (ref 1.7–7.7)
Neutrophils Relative %: 79 %
Platelets: 229 10*3/uL (ref 150–400)
RBC: 5.28 MIL/uL — ABNORMAL HIGH (ref 3.87–5.11)
RDW: 13.4 % (ref 11.5–15.5)
WBC: 14.9 10*3/uL — ABNORMAL HIGH (ref 4.0–10.5)
nRBC: 0 % (ref 0.0–0.2)

## 2022-04-21 LAB — CK: Total CK: 41 U/L (ref 38–234)

## 2022-04-21 MED ORDER — ONDANSETRON HCL 4 MG/2ML IJ SOLN
4.0000 mg | Freq: Once | INTRAMUSCULAR | Status: AC
  Administered 2022-04-21: 4 mg via INTRAVENOUS
  Filled 2022-04-21: qty 2

## 2022-04-21 MED ORDER — SODIUM CHLORIDE 0.9 % IV BOLUS
500.0000 mL | Freq: Once | INTRAVENOUS | Status: AC
  Administered 2022-04-21: 500 mL via INTRAVENOUS

## 2022-04-21 MED ORDER — IOHEXOL 350 MG/ML SOLN
100.0000 mL | Freq: Once | INTRAVENOUS | Status: AC | PRN
  Administered 2022-04-21: 100 mL via INTRAVENOUS

## 2022-04-21 MED ORDER — HYDROMORPHONE HCL 1 MG/ML IJ SOLN
0.5000 mg | Freq: Once | INTRAMUSCULAR | Status: AC
  Administered 2022-04-21: 0.5 mg via INTRAVENOUS
  Filled 2022-04-21: qty 1

## 2022-04-21 MED ORDER — IOHEXOL 300 MG/ML  SOLN: 100.0000 mL | Freq: Once | INTRAMUSCULAR | Status: DC | PRN

## 2022-04-21 MED ORDER — POTASSIUM CHLORIDE CRYS ER 20 MEQ PO TBCR
40.0000 meq | EXTENDED_RELEASE_TABLET | Freq: Once | ORAL | Status: AC
  Administered 2022-04-21: 40 meq via ORAL
  Filled 2022-04-21: qty 2

## 2022-04-21 NOTE — ED Triage Notes (Signed)
Per pt family, pt put on HTN meds in Dec, noticed was still high. Went to doctor last week meds increased. Left arm started swelling last night pt states real tight and painfull. Pain from neck to finger.

## 2022-04-21 NOTE — ED Provider Notes (Signed)
[facility] EMERGENCY DEPARTMENT Provider Note   CSN: [id number] Arrival date & time: 04/21/22  2050     History {Add pertinent medical, surgical, social history, OB history to HPI:1} Chief Complaint  Patient presents with   Arm Pain    Kathryn Thomas is a 81 y.o. female.  Patient as above with significant medical history as below, including A-fib on Eliquis, DM, depression, HTN who presents to the ED with complaint of left arm pain.  Patient reports symptoms began yesterday evening after taking her blood pressure.  Has been taking her blood pressure more frequently over the past few days secondary to hypertension.  Following use of the blood pressure cuff she experienced excruciating pain to her left distal humerus.  Slightly the blood pressure cuff became too tight.  Swelling pain and swelling to her entire left upper extremity.  Some tingling to her left hand.  Pain has been gradually worsening.  She is experiencing pain from her neck to her fingertips.  No headaches, no numbness, no nausea or vomiting.  No fevers or chills.  No other trauma reported, falls.  No similar symptoms in the past.  She is restarting Eliquis around a week ago.  Has been compliant last dose was this morning.  Denies chest pain or dyspnea.     Past Medical History:  Diagnosis Date   Acute renal failure ([place])    Anal stenosis 09/23/2008   Anxiety    Blood transfusion    " no reaction to transfusion "   Depression    Diabetes mellitus without complication (HCC)    External hemorrhoids    Gastric polyp    Gastritis    Hypertension    Intestinal metaplasia of gastric mucosa 09/24/2011   high grade dysplasia   Pernicious anemia    Shortness of breath    Vitamin B12 deficiency     Past Surgical History:  Procedure Laterality Date   [place]   three lower spine surgeries.   CHOLECYSTECTOMY N/A 11/26/2019   Procedure: LAPAROSCOPIC CHOLECYSTECTOMY WITH  INTRAOPERATIVE CHOLANGIOGRAM;  Surgeon: Armandina Gemma, MD;  Location: [facility] ORS;  Service: General;  Laterality: N/A;   ERCP N/A 11/27/2019   Procedure: ENDOSCOPIC RETROGRADE CHOLANGIOPANCREATOGRAPHY (ERCP);  Surgeon: Ladene Artist, MD;  Location: [facility];  Service: Endoscopy;  Laterality: N/A;   ESOPHAGOGASTRODUODENOSCOPY  10/01/2011   Procedure: ESOPHAGOGASTRODUODENOSCOPY (EGD);  Surgeon: Lafayette Dragon, MD;  Location: [facility] ENDOSCOPY;  Service: Endoscopy;  Laterality: N/A;   REMOVAL OF STONES  11/27/2019   Procedure: REMOVAL OF STONES;  Surgeon: Ladene Artist, MD;  Location: [facility] ENDOSCOPY;  Service: Endoscopy;;   SPHINCTEROTOMY  11/27/2019   Procedure: Joan Mayans;  Surgeon: Ladene Artist, MD;  Location: [facility] ENDOSCOPY;  Service: Endoscopy;;     The history is provided by the patient. No language interpreter was used.  Arm Pain Pertinent negatives include no chest pain, no abdominal pain, no headaches and no shortness of breath.       Home Medications Prior to Admission medications   Medication Sig Start Date End Date Taking? Authorizing Provider  acetaminophen (TYLENOL) 325 MG tablet You can take 2 tablets every 4 hours as needed for pain.  Do not take more than 4000 mg of Tylenol per day.  It can harm your liver. You can buy this over the counter at any drug store. 11/29/19   Earnstine Regal, PA-C  ALPRAZolam Duanne Moron) 0.5 MG tablet Take 0.25  mg by mouth 2 (two) times daily as needed for anxiety. 10/27/19   [provider]  amLODipine (NORVASC) 2.5 MG tablet Take 2.5 mg by mouth daily. 09/04/19   [provider]  buPROPion (WELLBUTRIN SR) 150 MG 12 hr tablet Take 1 tablet (150 mg total) by mouth 2 (two) times daily. 02/20/14   Robyn Haber, MD  celecoxib (CELEBREX) 200 MG capsule Talk to your primary care before resuming this medicine.  Your kidney function was elevated here in the hospital. 11/29/19   Earnstine Regal, PA-C  docusate sodium (COLACE) 100 MG capsule Take 1  capsule (100 mg total) by mouth 2 (two) times daily as needed for mild constipation or moderate constipation (use as needed for constipation). 12/01/19   Earnstine Regal, PA-C  fluticasone [facility]) 50 MCG/ACT nasal spray Place 2 sprays into both nostrils daily. Patient not taking: Reported on 11/25/2019 12/28/13   Orma Flaming, MD  gabapentin (NEURONTIN) 300 MG capsule Take 1 capsule (300 mg total) by mouth 3 (three) times daily as needed for up to 30 doses. 10/23/21   Wyvonnia Dusky, MD  hydrochlorothiazide (MICROZIDE) 12.5 MG capsule Take 1 capsule (12.5 mg total) by mouth daily. 07/07/14   Harrison Mons, PA  indomethacin (INDOCIN) 25 MG capsule Take 1 capsule (25 mg total) by mouth 3 (three) times daily with meals. 12/01/19   Earnstine Regal, PA-C  lisinopril (PRINIVIL,ZESTRIL) 10 MG tablet TAKE ONE TABLET BY MOUTH ONCE DAILY Patient taking differently: Take 10 mg by mouth daily.  07/06/14   Robyn Haber, MD  ondansetron (ZOFRAN ODT) 4 MG disintegrating tablet Take 1 tablet (4 mg total) by mouth every 8 (eight) hours as needed for nausea or vomiting. 01/27/21   Rayna Sexton, PA-C  oxyCODONE (OXY IR/ROXICODONE) 5 MG immediate release tablet Take 1-2 tablets (5-10 mg total) by mouth every 4 (four) hours as needed for severe pain or breakthrough pain (for pain not relieved by Tylenol). 11/29/19   Earnstine Regal, PA-C  polyethylene glycol (MIRALAX / GLYCOLAX) 17 g packet Take 17 g by mouth daily as needed for mild constipation, moderate constipation or severe constipation (use as needed follow package directions). 12/01/19   Earnstine Regal, PA-C  Vitamin D, Ergocalciferol, (DRISDOL) 50000 UNITS CAPS capsule Take 50,000 Units by mouth every 7 (seven) days. On Wednesday.    [provider]      Allergies    Patient has no known allergies.    Review of Systems   Review of Systems  Constitutional:  Negative for activity change and fever.  HENT:  Negative for facial swelling and  trouble swallowing.   Eyes:  Negative for discharge and redness.  Respiratory:  Negative for cough and shortness of breath.   Cardiovascular:  Negative for chest pain and palpitations.  Gastrointestinal:  Negative for abdominal pain and nausea.  Genitourinary:  Negative for dysuria and flank pain.  Musculoskeletal:  Positive for arthralgias and myalgias. Negative for back pain and gait problem.  Skin:  Negative for pallor and rash.  Neurological:  Negative for syncope and headaches.    Physical Exam Updated Vital Signs BP (!) 166/72 (BP Location: Right Arm)   Pulse 81   Temp 99.3 F (37.4 C) (Oral)   Resp 18   Ht '5\' 7"'$  (1.702 m)   Wt 106.6 kg   SpO2 94%   BMI 36.81 kg/m  Physical Exam Vitals and nursing note reviewed.  Constitutional:      General: She is not in acute distress.  Appearance: Normal appearance.  HENT:     Head: Normocephalic and atraumatic.     Right Ear: External ear normal.     Left Ear: External ear normal.     Nose: Nose normal.     Mouth/Throat:     Mouth: Mucous membranes are moist.  Eyes:     General: No scleral icterus.       Right eye: No discharge.        Left eye: No discharge.  Cardiovascular:     Rate and Rhythm: Normal rate and regular rhythm.     Pulses: Normal pulses.     Heart sounds: Normal heart sounds.  Pulmonary:     Effort: Pulmonary effort is normal. No respiratory distress.     Breath sounds: Normal breath sounds.  Abdominal:     General: Abdomen is flat.     Tenderness: There is no abdominal tenderness.  Musculoskeletal:        General: Normal range of motion.     Cervical back: Normal range of motion.     Right lower leg: No edema.     Left lower leg: No edema.     Comments: Discomfort primarily at her midshaft humerus on left arm but her entire arm is very painful.  Mild swelling to the entire length of her left upper extremity.  Radial pulse 1+ b/l symmetric.  Capillary refill is brisk to fingertips bilateral  Skin:     General: Skin is warm and dry.     Capillary Refill: Capillary refill takes less than 2 seconds.  Neurological:     Mental Status: She is alert and oriented to person, place, and time.     GCS: GCS eye subscore is 4. GCS verbal subscore is 5. GCS motor subscore is 6.     Cranial Nerves: Cranial nerves 2-12 are intact. No dysarthria or facial asymmetry.     Sensory: Sensation is intact.     Motor: Motor function is intact. No tremor.     Coordination: Coordination is intact.     Comments: Difficult to assess range of motion, strength of left upper extremity secondary to discomfort   Psychiatric:        Mood and Affect: Mood normal.        Behavior: Behavior normal.     ED Results / Procedures / Treatments   Labs (all labs ordered are listed, but only abnormal results are displayed) Labs Reviewed  CBC WITH DIFFERENTIAL/PLATELET  BASIC METABOLIC PANEL  CK    EKG None  Radiology No results found.  Procedures Procedures  {Document cardiac monitor, telemetry assessment procedure when appropriate:1}  Medications Ordered in ED Medications  HYDROmorphone (DILAUDID) injection 0.5 mg (has no administration in time range)  ondansetron (ZOFRAN) injection 4 mg (has no administration in time range)  sodium chloride 0.9 % bolus 500 mL (has no administration in time range)    ED Course/ Medical Decision Making/ A&P                           Medical Decision Making Amount and/or Complexity of Data Reviewed Labs: ordered. Radiology: ordered.  Risk Prescription drug management.    CC: left arm pain  This patient presents to the Emergency Department for the above complaint. This involves an extensive number of treatment options and is a complaint that carries with it a high risk of complications and morbidity. Vital signs were reviewed. Serious etiologies considered.  Record review:  Previous records obtained and reviewed prior office visits, prior labs, home meds, prior  imaging  Additional history obtained from family bedside  Medical and surgical history as noted above.   Work up as above, notable for:  Labs & imaging results that were available during my care of the patient were visualized by me and considered in my medical decision making.  Physical exam as above.   I ordered imaging studies which included humerus XR, Ct angio left arm, cxr. I visualized the imaging, interpreted images, and I agree with radiologist interpretation. Humerus XR neg, cxr and ct angio pending at time of shift change  Cardiac monitoring reviewed and interpreted personally which shows NSR  Labs reviewed Mild leukocytosis, she has low grade fever; she has no uri s/s   Management: Analgesics, ivf  ED Course:     Reassessment:  Symptoms improved  Admission was considered.                Social determinants of health include -  Social History   Socioeconomic History   Marital status: Widowed    Spouse name: Not on file   Number of children: 2   Years of education: Not on file   Highest education level: Not on file  Occupational History   Occupation: Retired  Tobacco Use   Smoking status: Never   Smokeless tobacco: Never  Vaping Use   Vaping Use: Never used  Substance and Sexual Activity   Alcohol use: No   Drug use: No   Sexual activity: Not on file  Other Topics Concern   Not on file  Social History Narrative   Patient is widowed 7 years and lives alone within 5 minutes of her two daughters. She is unemployed. Has medicare. Has a good social support with family and church friends.    Social Determinants of Health   Financial Resource Strain: Not on file  Food Insecurity: Not on file  Transportation Needs: Not on file  Physical Activity: Not on file  Stress: Not on file  Social Connections: Not on file  Intimate Partner Violence: Not on file      This chart was dictated using voice recognition software.  Despite best efforts to  proofread,  errors can occur which can change the documentation meaning.   {Document critical care time when appropriate:1} {Document review of labs and clinical decision tools ie heart score, Chads2Vasc2 etc:1}  {Document your independent review of radiology images, and any outside records:1} {Document your discussion with family members, caretakers, and with consultants:1} {Document social determinants of health affecting pt's care:1} {Document your decision making why or why not admission, treatments were needed:1} Final Clinical Impression(s) / ED Diagnoses Final diagnoses:  None    Rx / DC Orders ED Discharge Orders     None

## 2022-04-22 ENCOUNTER — Encounter (HOSPITAL_COMMUNITY): Payer: Self-pay | Admitting: Internal Medicine

## 2022-04-22 ENCOUNTER — Observation Stay (HOSPITAL_BASED_OUTPATIENT_CLINIC_OR_DEPARTMENT_OTHER): Payer: Medicare HMO

## 2022-04-22 ENCOUNTER — Observation Stay (HOSPITAL_COMMUNITY): Payer: Medicare HMO

## 2022-04-22 ENCOUNTER — Observation Stay (HOSPITAL_BASED_OUTPATIENT_CLINIC_OR_DEPARTMENT_OTHER): Admit: 2022-04-22 | Payer: Medicare HMO

## 2022-04-22 DIAGNOSIS — I482 Chronic atrial fibrillation, unspecified: Secondary | ICD-10-CM | POA: Diagnosis not present

## 2022-04-22 DIAGNOSIS — E785 Hyperlipidemia, unspecified: Secondary | ICD-10-CM | POA: Diagnosis present

## 2022-04-22 DIAGNOSIS — R630 Anorexia: Secondary | ICD-10-CM | POA: Diagnosis not present

## 2022-04-22 DIAGNOSIS — I129 Hypertensive chronic kidney disease with stage 1 through stage 4 chronic kidney disease, or unspecified chronic kidney disease: Secondary | ICD-10-CM | POA: Diagnosis not present

## 2022-04-22 DIAGNOSIS — Z7984 Long term (current) use of oral hypoglycemic drugs: Secondary | ICD-10-CM | POA: Diagnosis not present

## 2022-04-22 DIAGNOSIS — N1831 Chronic kidney disease, stage 3a: Secondary | ICD-10-CM | POA: Diagnosis present

## 2022-04-22 DIAGNOSIS — Z79899 Other long term (current) drug therapy: Secondary | ICD-10-CM | POA: Diagnosis not present

## 2022-04-22 DIAGNOSIS — M48061 Spinal stenosis, lumbar region without neurogenic claudication: Secondary | ICD-10-CM | POA: Diagnosis present

## 2022-04-22 DIAGNOSIS — Z20822 Contact with and (suspected) exposure to covid-19: Secondary | ICD-10-CM | POA: Diagnosis not present

## 2022-04-22 DIAGNOSIS — M47812 Spondylosis without myelopathy or radiculopathy, cervical region: Secondary | ICD-10-CM | POA: Diagnosis not present

## 2022-04-22 DIAGNOSIS — E876 Hypokalemia: Secondary | ICD-10-CM | POA: Diagnosis not present

## 2022-04-22 DIAGNOSIS — I10 Essential (primary) hypertension: Secondary | ICD-10-CM | POA: Diagnosis present

## 2022-04-22 DIAGNOSIS — E1122 Type 2 diabetes mellitus with diabetic chronic kidney disease: Secondary | ICD-10-CM | POA: Diagnosis not present

## 2022-04-22 DIAGNOSIS — M79602 Pain in left arm: Secondary | ICD-10-CM

## 2022-04-22 DIAGNOSIS — M5021 Other cervical disc displacement,  high cervical region: Secondary | ICD-10-CM | POA: Diagnosis not present

## 2022-04-22 DIAGNOSIS — Z7901 Long term (current) use of anticoagulants: Secondary | ICD-10-CM | POA: Diagnosis not present

## 2022-04-22 DIAGNOSIS — N179 Acute kidney failure, unspecified: Secondary | ICD-10-CM | POA: Diagnosis not present

## 2022-04-22 DIAGNOSIS — M79601 Pain in right arm: Secondary | ICD-10-CM | POA: Diagnosis not present

## 2022-04-22 DIAGNOSIS — R778 Other specified abnormalities of plasma proteins: Secondary | ICD-10-CM | POA: Diagnosis not present

## 2022-04-22 DIAGNOSIS — F419 Anxiety disorder, unspecified: Secondary | ICD-10-CM | POA: Diagnosis present

## 2022-04-22 DIAGNOSIS — Z8669 Personal history of other diseases of the nervous system and sense organs: Secondary | ICD-10-CM | POA: Diagnosis not present

## 2022-04-22 DIAGNOSIS — E1169 Type 2 diabetes mellitus with other specified complication: Secondary | ICD-10-CM | POA: Diagnosis present

## 2022-04-22 LAB — URINALYSIS, ROUTINE W REFLEX MICROSCOPIC
Bilirubin Urine: NEGATIVE
Glucose, UA: NEGATIVE mg/dL
Ketones, ur: NEGATIVE mg/dL
Leukocytes,Ua: NEGATIVE
Nitrite: NEGATIVE
Protein, ur: >=300 mg/dL — AB
Specific Gravity, Urine: 1.015 (ref 1.005–1.030)
pH: 6.5 (ref 5.0–8.0)

## 2022-04-22 LAB — GLUCOSE, CAPILLARY
Glucose-Capillary: 130 mg/dL — ABNORMAL HIGH (ref 70–99)
Glucose-Capillary: 197 mg/dL — ABNORMAL HIGH (ref 70–99)
Glucose-Capillary: 140 mg/dL — ABNORMAL HIGH (ref 70–99)

## 2022-04-22 LAB — RESP PANEL BY RT-PCR (FLU A&B, COVID) ARPGX2
Influenza A by PCR: NEGATIVE
Influenza B by PCR: NEGATIVE
SARS Coronavirus 2 by RT PCR: NEGATIVE

## 2022-04-22 LAB — URINALYSIS, MICROSCOPIC (REFLEX)

## 2022-04-22 LAB — TROPONIN I (HIGH SENSITIVITY)
Troponin I (High Sensitivity): 110 ng/L (ref <18)
Troponin I (High Sensitivity): 54 ng/L — ABNORMAL HIGH (ref <18)
Troponin I (High Sensitivity): 60 ng/L — ABNORMAL HIGH (ref <18)
Troponin I (High Sensitivity): 80 ng/L — ABNORMAL HIGH (ref <18)

## 2022-04-22 LAB — HEMOGLOBIN A1C
Hgb A1c MFr Bld: 6.7 % — ABNORMAL HIGH (ref 4.8–5.6)
Mean Plasma Glucose: 145.59 mg/dL

## 2022-04-22 LAB — HEPARIN LEVEL (UNFRACTIONATED): Heparin Unfractionated: 0.64 IU/mL (ref 0.30–0.70)

## 2022-04-22 LAB — APTT: aPTT: 39 seconds — ABNORMAL HIGH (ref 24–36)

## 2022-04-22 MED ORDER — GADOBUTROL 1 MMOL/ML IV SOLN
10.0000 mL | Freq: Once | INTRAVENOUS | Status: AC | PRN
  Administered 2022-04-22: 10 mL via INTRAVENOUS

## 2022-04-22 MED ORDER — ONDANSETRON HCL 4 MG/2ML IJ SOLN: 4.0000 mg | Freq: Four times a day (QID) | INTRAMUSCULAR | Status: DC | PRN

## 2022-04-22 MED ORDER — POTASSIUM CHLORIDE CRYS ER 20 MEQ PO TBCR
40.0000 meq | EXTENDED_RELEASE_TABLET | Freq: Once | ORAL | Status: AC
Start: 2022-04-22 — End: 2022-04-22
  Administered 2022-04-22: 40 meq via ORAL
  Filled 2022-04-22: qty 2

## 2022-04-22 MED ORDER — BISACODYL 5 MG PO TBEC: 5.0000 mg | DELAYED_RELEASE_TABLET | Freq: Every day | ORAL | Status: DC | PRN

## 2022-04-22 MED ORDER — HEPARIN (PORCINE) 25000 UT/250ML-% IV SOLN
1300.0000 [IU]/h | INTRAVENOUS | Status: DC
  Administered 2022-04-22: 1100 [IU]/h via INTRAVENOUS
  Filled 2022-04-22: qty 250

## 2022-04-22 MED ORDER — LACTATED RINGERS IV SOLN
INTRAVENOUS | Status: DC
Start: 2022-04-22 — End: 2022-04-23

## 2022-04-22 MED ORDER — MORPHINE SULFATE (PF) 2 MG/ML IV SOLN: 2.0000 mg | INTRAVENOUS | Status: DC | PRN

## 2022-04-22 MED ORDER — DEXTROSE 5 % IV SOLN: 500.0000 mg | Freq: Four times a day (QID) | INTRAVENOUS | Status: DC | PRN

## 2022-04-22 MED ORDER — LIDOCAINE 5 % EX PTCH
3.0000 | MEDICATED_PATCH | CUTANEOUS | Status: DC
  Administered 2022-04-22 – 2022-04-23 (×2): 3 via TRANSDERMAL
  Filled 2022-04-22 (×2): qty 3

## 2022-04-22 MED ORDER — SODIUM CHLORIDE 0.9% FLUSH
3.0000 mL | Freq: Two times a day (BID) | INTRAVENOUS | Status: DC
  Administered 2022-04-22 – 2022-04-23 (×2): 3 mL via INTRAVENOUS

## 2022-04-22 MED ORDER — ACETAMINOPHEN 650 MG RE SUPP: 650.0000 mg | Freq: Four times a day (QID) | RECTAL | Status: DC | PRN

## 2022-04-22 MED ORDER — POLYETHYLENE GLYCOL 3350 17 G PO PACK: 17.0000 g | PACK | Freq: Every day | ORAL | Status: DC | PRN

## 2022-04-22 MED ORDER — ONDANSETRON HCL 4 MG PO TABS: 4.0000 mg | ORAL_TABLET | Freq: Four times a day (QID) | ORAL | Status: DC | PRN

## 2022-04-22 MED ORDER — APIXABAN 5 MG PO TABS
5.0000 mg | ORAL_TABLET | Freq: Two times a day (BID) | ORAL | Status: DC
Start: 2022-04-22 — End: 2022-04-23
  Administered 2022-04-22 – 2022-04-23 (×2): 5 mg via ORAL
  Filled 2022-04-22 (×2): qty 1

## 2022-04-22 MED ORDER — GABAPENTIN 300 MG PO CAPS
300.0000 mg | ORAL_CAPSULE | Freq: Three times a day (TID) | ORAL | Status: DC | PRN
  Administered 2022-04-23: 300 mg via ORAL
  Filled 2022-04-22: qty 1

## 2022-04-22 MED ORDER — LOSARTAN POTASSIUM 50 MG PO TABS
100.0000 mg | ORAL_TABLET | Freq: Every day | ORAL | Status: DC
  Administered 2022-04-23: 100 mg via ORAL
  Filled 2022-04-22: qty 2

## 2022-04-22 MED ORDER — HYDRALAZINE HCL 20 MG/ML IJ SOLN: 5.0000 mg | INTRAMUSCULAR | Status: DC | PRN

## 2022-04-22 MED ORDER — ALPRAZOLAM 0.25 MG PO TABS
0.2500 mg | ORAL_TABLET | Freq: Two times a day (BID) | ORAL | Status: DC | PRN
  Administered 2022-04-22: 0.25 mg via ORAL
  Filled 2022-04-22: qty 1

## 2022-04-22 MED ORDER — BUSPIRONE HCL 5 MG PO TABS: 5.0000 mg | ORAL_TABLET | Freq: Three times a day (TID) | ORAL | Status: DC | PRN

## 2022-04-22 MED ORDER — METOPROLOL SUCCINATE ER 25 MG PO TB24
25.0000 mg | ORAL_TABLET | Freq: Every day | ORAL | Status: DC
  Administered 2022-04-23: 25 mg via ORAL
  Filled 2022-04-22: qty 1

## 2022-04-22 MED ORDER — ATORVASTATIN CALCIUM 10 MG PO TABS
20.0000 mg | ORAL_TABLET | Freq: Every day | ORAL | Status: DC
  Administered 2022-04-23: 20 mg via ORAL
  Filled 2022-04-22: qty 2

## 2022-04-22 MED ORDER — DOCUSATE SODIUM 100 MG PO CAPS
100.0000 mg | ORAL_CAPSULE | Freq: Two times a day (BID) | ORAL | Status: DC
  Administered 2022-04-22 – 2022-04-23 (×2): 100 mg via ORAL
  Filled 2022-04-22 (×2): qty 1

## 2022-04-22 MED ORDER — OXYCODONE HCL 5 MG PO TABS
5.0000 mg | ORAL_TABLET | ORAL | Status: DC | PRN
  Administered 2022-04-22 – 2022-04-23 (×2): 5 mg via ORAL
  Filled 2022-04-22 (×2): qty 1

## 2022-04-22 MED ORDER — INSULIN ASPART 100 UNIT/ML IJ SOLN
0.0000 [IU] | Freq: Three times a day (TID) | INTRAMUSCULAR | Status: DC
  Administered 2022-04-22: 3 [IU] via SUBCUTANEOUS
  Administered 2022-04-23: 2 [IU] via SUBCUTANEOUS
  Administered 2022-04-23: 3 [IU] via SUBCUTANEOUS

## 2022-04-22 MED ORDER — HYDROXYZINE HCL 10 MG PO TABS: 10.0000 mg | ORAL_TABLET | Freq: Three times a day (TID) | ORAL | Status: DC | PRN

## 2022-04-22 MED ORDER — QUETIAPINE FUMARATE 50 MG PO TABS
50.0000 mg | ORAL_TABLET | Freq: Two times a day (BID) | ORAL | Status: DC
  Administered 2022-04-22 – 2022-04-23 (×3): 50 mg via ORAL
  Filled 2022-04-22 (×3): qty 1

## 2022-04-22 MED ORDER — LORAZEPAM 2 MG/ML IJ SOLN
1.0000 mg | Freq: Once | INTRAMUSCULAR | Status: DC | PRN
Start: 2022-04-22 — End: 2022-04-23

## 2022-04-22 MED ORDER — ACETAMINOPHEN 325 MG PO TABS
650.0000 mg | ORAL_TABLET | Freq: Four times a day (QID) | ORAL | Status: DC | PRN
  Administered 2022-04-23: 650 mg via ORAL
  Filled 2022-04-22: qty 2

## 2022-04-22 MED ORDER — KETOROLAC TROMETHAMINE 30 MG/ML IJ SOLN
15.0000 mg | Freq: Once | INTRAMUSCULAR | Status: AC
  Administered 2022-04-22: 15 mg via INTRAVENOUS
  Filled 2022-04-22: qty 1

## 2022-04-22 MED ORDER — INSULIN ASPART 100 UNIT/ML IJ SOLN: 0.0000 [IU] | Freq: Every day | INTRAMUSCULAR | Status: DC

## 2022-04-22 NOTE — ED Notes (Signed)
Report given to [facility]. 

## 2022-04-22 NOTE — Progress Notes (Signed)
Left upper extremity arterial duplex completed. Refer to "CV Proc" under chart review to view preliminary results.  04/22/2022 12:18 PM Kelby Aline., MHA, RVT, RDCS, RDMS

## 2022-04-22 NOTE — ED Provider Notes (Signed)
Signed out CTA on patient and plan based on CTA   NCAT IRIR  CTAB NABS Palpable B radial pulses with < 2 sec cap refill to the digits of B hands.  Hands are warm to touch and symmetric.  Have dopplered triphasic pulses in B radial FROM       EKG Interpretation   Date/Time:                      'Sunday April 21 2022 23:12:32 EDT Ventricular Rate:   94 PR Interval:                      145 QRS Duration:        84 QT Interval:                      350 QTC Calculation:    438 R Axis:                         74'$  Text Interpretation: Sinus rhythm Confirmed by Randal Buba, Nathaneal Sommers ([id number]) on 04/21/2022 11:39:22 PM                Results for orders placed or performed during the hospital encounter of 04/21/22  SARS Coronavirus 2 by RT PCR (hospital order, performed in [facility] hospital lab) *cepheid single result test* Anterior Nasal Swab    Specimen: Anterior Nasal Swab  Result Value Ref Range    SARS Coronavirus 2 by RT PCR NEGATIVE NEGATIVE  CBC with Differential  Result Value Ref Range    WBC 24.4 (H) 4.0 - 10.5 K/uL    RBC 5.49 4.22 - 5.81 MIL/uL    Hemoglobin 17.5 (H) 13.0 - 17.0 g/dL    HCT 48.3 39.0 - 52.0 %    MCV 88.0 80.0 - 100.0 fL    MCH 31.9 26.0 - 34.0 pg    MCHC 36.2 (H) 30.0 - 36.0 g/dL    RDW 12.4 11.5 - 15.5 %    Platelets 321 150 - 400 K/uL    nRBC 0.0 0.0 - 0.2 %    Neutrophils Relative % 80 %    Neutro Abs 19.5 (H) 1.7 - 7.7 K/uL    Lymphocytes Relative 12 %    Lymphs Abs 3.0 0.7 - 4.0 K/uL    Monocytes Relative 7 %    Monocytes Absolute 1.6 (H) 0.1 - 1.0 K/uL    Eosinophils Relative 0 %    Eosinophils Absolute 0.0 0.0 - 0.5 K/uL    Basophils Relative 0 %    Basophils Absolute 0.1 0.0 - 0.1 K/uL    Immature Granulocytes 1 %    Abs Immature Granulocytes 0.17 (H) 0.00 - 0.07 K/uL  Comprehensive metabolic panel  Result Value Ref Range    Sodium 136 135 - 145 mmol/L    Potassium 3.2 (L) 3.5 - 5.1 mmol/L    Chloride 103 98 - 111 mmol/L    CO2 16 (L) 22 -  32 mmol/L    Glucose, Bld 172 (H) 70 - 99 mg/dL    BUN 12 6 - 20 mg/dL    Creatinine, Ser 1.17 0.61 - 1.24 mg/dL    Calcium 9.4 8.9 - 10.3 mg/dL    Total Protein 7.6 6.5 - 8.1 g/dL    Albumin 5.1 (H) 3.5 - 5.0 g/dL    AST 59 (H) 15 - 41 U/L    ALT 46 (  H) 0 - 44 U/L    Alkaline Phosphatase 100 38 - 126 U/L    Total Bilirubin 0.6 0.3 - 1.2 mg/dL    GFR, Estimated >60 >60 mL/min    Anion gap 17 (H) 5 - 15  Rapid urine drug screen (hospital performed)  Result Value Ref Range    Opiates NONE DETECTED NONE DETECTED    Cocaine NONE DETECTED NONE DETECTED    Benzodiazepines NONE DETECTED NONE DETECTED    Amphetamines NONE DETECTED NONE DETECTED    Tetrahydrocannabinol POSITIVE (A) NONE DETECTED    Barbiturates NONE DETECTED NONE DETECTED  Ethanol  Result Value Ref Range    Alcohol, Ethyl (B) <10 <10 mg/dL  Urinalysis, Routine w reflex microscopic Urine, Clean Catch  Result Value Ref Range    Color, Urine YELLOW YELLOW    APPearance HAZY (A) CLEAR    Specific Gravity, Urine 1.020 1.005 - 1.030    pH 5.5 5.0 - 8.0    Glucose, UA NEGATIVE NEGATIVE mg/dL    Hgb urine dipstick TRACE (A) NEGATIVE    Bilirubin Urine NEGATIVE NEGATIVE    Ketones, ur NEGATIVE NEGATIVE mg/dL    Protein, ur 30 (A) NEGATIVE mg/dL    Nitrite NEGATIVE NEGATIVE    Leukocytes,Ua NEGATIVE NEGATIVE  Urinalysis, Microscopic (reflex)  Result Value Ref Range    RBC / HPF 0-5 0 - 5 RBC/hpf    WBC, UA 0-5 0 - 5 WBC/hpf    Bacteria, UA RARE (A) NONE SEEN    Squamous Epithelial / LPF 0-5 0 - 5    Uric Acid Crys, UA PRESENT       Imaging Results  CT Head Wo Contrast   Result Date: 04/22/2022 CLINICAL DATA:  Poly trauma, blunt. Three seizures today with sweating and emesis. EXAM: CT HEAD WITHOUT CONTRAST TECHNIQUE: Contiguous axial images were obtained from the base of the skull through the vertex without intravenous contrast. RADIATION DOSE REDUCTION: This exam was performed according to the departmental  dose-optimization program which includes automated exposure control, adjustment of the mA and/or kV according to patient size and/or use of iterative reconstruction technique. COMPARISON:  CT head 12/29/2020.  MRI brain 01/25/2021 FINDINGS: Brain: No evidence of acute infarction, hemorrhage, hydrocephalus, extra-axial collection or mass lesion/mass effect. Vascular: No hyperdense vessel or unexpected calcification. Skull: Normal. Negative for fracture or focal lesion. Sinuses/Orbits: No acute finding. Other: None. IMPRESSION: No acute intracranial abnormalities. Electronically Signed   By: Lucienne Capers M.D.   On: 04/22/2022 00:17    DG Chest Portable 1 View   Result Date: 04/22/2022 CLINICAL DATA:  Seizure. EXAM: PORTABLE CHEST 1 VIEW COMPARISON:  None Available. FINDINGS: The heart size and mediastinal contours are within normal limits. Both lungs are clear. The visualized skeletal structures are unremarkable. IMPRESSION: No active disease. Electronically Signed   By: Anner Crete M.D.   On: 04/22/2022 00:14      Case d/w Dr. Stanford Breed of vascular.  If radial pulses this is likely not an acute occlusion and is likely technique. Heparin therapy.   Will consult   Patient with elevated troponin, hypoxia on RA and neck and arm pain   DDX:   ACS will initiate heparin and cycle troponins and admit to medicine PE: given hypoxia with AFIB only recently on anticoagulation this is a possibility.  Cannot get more contrast today.  Will place on heparin,  will need a VQ scan in am to rule out PE.   Arm pain: ACS, vs radicular pain  as the pain starts in the neck and is with movement Regardless will need admission with additional testing and treatment   Have spoken to patient and family at length regarding this.     MDM Reviewed: previous chart Interpretation: labs, ECG, x-ray and CT scan (negative CXR) Total time providing critical care: 30-74 minutes (heparin therapy IV). This excludes time spent  performing separately reportable procedures and services. Consults: admitting MD (vascular)     CRITICAL CARE Performed by: Jacquelin Krajewski K Robie Oats-Rasch Total critical care time: 60 minutes Critical care time was exclusive of separately billable procedures and treating other patients. Critical care was necessary to treat or prevent imminent or life-threatening deterioration. Critical care was time spent personally by me on the following activities: development of treatment plan with patient and/or surrogate as well as nursing, discussions with consultants, evaluation of patient's response to treatment, examination of patient, obtaining history from patient or surrogate, ordering and performing treatments and interventions, ordering and review of laboratory studies, ordering and review of radiographic studies, pulse oximetry and re-evaluation of patient's condition.        Yuki Purves, MD 04/22/22 [phone]

## 2022-04-22 NOTE — Progress Notes (Addendum)
ANTICOAGULATION CONSULT NOTE - Initial Consult  Pharmacy Consult for Heparin  Indication: ?upper extremity occlusion, history of afib  No Known Allergies  Patient Measurements: Height: '5\' 7"'$  (170.2 cm) Weight: 106.6 kg (235 lb) IBW/kg (Calculated) : 61.6  Vital Signs: Temp: 99.7 F (37.6 C) (07/31 0113) Temp Source: Rectal (07/31 0113) BP: 155/87 (07/31 0230) Pulse Rate: 72 (07/31 0230)  Labs: Recent Labs    04/21/22 2221 04/21/22 2338  HGB 15.3*  --   HCT 46.5*  --   PLT 229  --   CREATININE 1.00  --   CKTOTAL 41  --   TROPONINIHS  --  80*    Estimated Creatinine Clearance: 55.4 mL/min (by C-G formula based on SCr of 1 mg/dL).   Medical History: Past Medical History:  Diagnosis Date   Acute renal failure ([place])    Anal stenosis 09/23/2008   Anxiety    Blood transfusion    " no reaction to transfusion "   Depression    Diabetes mellitus without complication ([facility])    External hemorrhoids    Gastric polyp    Gastritis    Hypertension    Intestinal metaplasia of gastric mucosa 09/24/2011   high grade dysplasia   Pernicious anemia    Shortness of breath    Vitamin B12 deficiency      Assessment: 81 y/o F with painful/swollen left arm. High sensitivity troponin mildly elevated. Starting heparin. Vascular consult placed by EDP. On apixaban PTA, last dose 7/30 AM. Anticipate using aPTT to dose for now. Labs above reviewed.   Goal of Therapy:  Heparin level 0.3-0.7 units/ml aPTT 66-102 seconds Monitor platelets by anticoagulation protocol: Yes   Plan:  Start heparin drip at 1100 units/hr 0930 heparin level and aPTT Daily CBC, heparin level, aPTT Monitor for bleeding  Narda Bonds, PharmD, BCPS Clinical Pharmacist Phone: [phone]

## 2022-04-22 NOTE — ED Notes (Signed)
Pulse in bilateral arms verified via doppler

## 2022-04-22 NOTE — Consult Note (Addendum)
Hospital Consult    Reason for Consult:  L arm pain Requesting Physician:  ED MRN #:  [id number]  History of Present Illness: This is a 81 y.o. female with past medical history significant for DM, hypertension, stage III chronic kidney disease, hyperlipidemia, and atrial fibrillation who was transferred to [facility] for evaluation of left arm pain.  There was concern for an embolic event to the left arm given technically difficult CTA imaging.  The patient believes her arm pain is related to numerous blood pressure checks at home.  She believes her arm is feeling better with the lidocaine patches.  Work-up also included arterial duplex demonstrating widely patent flow into the hand as well as a complete and patent palmar arch.  Past Medical History:  Diagnosis Date   Acute renal failure ([place])    Anal stenosis 09/23/2008   Anxiety    Atrial fibrillation ([place])    Blood transfusion    " no reaction to transfusion "   Depression    Diabetes mellitus without complication ([place])    Dyslipidemia    External hemorrhoids    Gastric polyp    Gastritis    Hypertension    Intestinal metaplasia of gastric mucosa 09/24/2011   high grade dysplasia   Pernicious anemia    Shortness of breath    Vitamin B12 deficiency     Past Surgical History:  Procedure Laterality Date   [place]   three lower spine surgeries.   CHOLECYSTECTOMY N/A 11/26/2019   Procedure: LAPAROSCOPIC CHOLECYSTECTOMY WITH INTRAOPERATIVE CHOLANGIOGRAM;  Surgeon: Armandina Gemma, MD;  Location: [facility];  Service: General;  Laterality: N/A;   ERCP N/A 11/27/2019   Procedure: ENDOSCOPIC RETROGRADE CHOLANGIOPANCREATOGRAPHY (ERCP);  Surgeon: Ladene Artist, MD;  Location: [facility];  Service: Endoscopy;  Laterality: N/A;   ESOPHAGOGASTRODUODENOSCOPY  10/01/2011   Procedure: ESOPHAGOGASTRODUODENOSCOPY (EGD);  Surgeon: Lafayette Dragon, MD;  Location: [facility];  Service: Endoscopy;  Laterality:  N/A;   REMOVAL OF STONES  11/27/2019   Procedure: REMOVAL OF STONES;  Surgeon: Ladene Artist, MD;  Location: [facility] ENDOSCOPY;  Service: Endoscopy;;   SPHINCTEROTOMY  11/27/2019   Procedure: Joan Mayans;  Surgeon: Ladene Artist, MD;  Location: [facility] ENDOSCOPY;  Service: Endoscopy;;    No Known Allergies  Prior to Admission medications   Medication Sig Start Date End Date Taking? Authorizing Provider  acetaminophen (TYLENOL) 325 MG tablet You can take 2 tablets every 4 hours as needed for pain.  Do not take more than 4000 mg of Tylenol per day.  It can harm your liver. You can buy this over the counter at any drug store. 11/29/19   Earnstine Regal, PA-C  ALPRAZolam Duanne Moron) 0.5 MG tablet Take 0.25 mg by mouth 2 (two) times daily as needed for anxiety. 10/27/19   [provider]  amLODipine (NORVASC) 2.5 MG tablet Take 2.5 mg by mouth daily. 09/04/19   [provider]  atorvastatin (LIPITOR) 20 MG tablet Take 20 mg by mouth daily. 02/22/22   [provider]  buPROPion (WELLBUTRIN SR) 150 MG 12 hr tablet Take 1 tablet (150 mg total) by mouth 2 (two) times daily. 02/20/14   Robyn Haber, MD  celecoxib (CELEBREX) 200 MG capsule Talk to your primary care before resuming this medicine.  Your kidney function was elevated here in the hospital. 11/29/19   Earnstine Regal, PA-C  ELIQUIS 5 MG TABS tablet Take 5 mg by mouth 2 (two) times  daily. 03/20/22   [provider]  escitalopram (LEXAPRO) 10 MG tablet Take 10 mg by mouth daily. 04/15/22   [provider]  gabapentin (NEURONTIN) 300 MG capsule Take 1 capsule (300 mg total) by mouth 3 (three) times daily as needed for up to 30 doses. 10/23/21   Wyvonnia Dusky, MD  hydrochlorothiazide (MICROZIDE) 12.5 MG capsule Take 1 capsule (12.5 mg total) by mouth daily. 07/07/14   Harrison Mons, PA  indomethacin (INDOCIN) 25 MG capsule Take 1 capsule (25 mg total) by mouth 3 (three) times daily with meals. 12/01/19    Earnstine Regal, PA-C  lisinopril (PRINIVIL,ZESTRIL) 10 MG tablet TAKE ONE TABLET BY MOUTH ONCE DAILY Patient taking differently: Take 10 mg by mouth daily.  07/06/14   Robyn Haber, MD  losartan (COZAAR) 100 MG tablet Take 100 mg by mouth daily. 04/15/22   [provider]  losartan (COZAAR) 50 MG tablet Take 50 mg by mouth daily. 02/22/22   [provider]  metFORMIN (GLUCOPHAGE-XR) 500 MG 24 hr tablet Take 500 mg by mouth daily. 04/15/22   [provider]  metoprolol succinate (TOPROL-XL) 25 MG 24 hr tablet Take 25 mg by mouth daily. 03/20/22   [provider]  QUEtiapine (SEROQUEL) 50 MG tablet Take 50 mg by mouth 2 (two) times daily. 04/15/22   [provider]  Vitamin D, Ergocalciferol, (DRISDOL) 50000 UNITS CAPS capsule Take 50,000 Units by mouth every 7 (seven) days. On Wednesday.    [provider]    Social History   Socioeconomic History   Marital status: Widowed    Spouse name: Not on file   Number of children: 2   Years of education: Not on file   Highest education level: Not on file  Occupational History   Occupation: Retired  Tobacco Use   Smoking status: Never   Smokeless tobacco: Never  Vaping Use   Vaping Use: Never used  Substance and Sexual Activity   Alcohol use: No   Drug use: No   Sexual activity: Not on file  Other Topics Concern   Not on file  Social History Narrative   Patient is widowed 7 years and lives alone within 5 minutes of her two daughters. She is unemployed. Has medicare. Has a good social support with family and church friends.    Social Determinants of Health   Financial Resource Strain: Not on file  Food Insecurity: Not on file  Transportation Needs: Not on file  Physical Activity: Not on file  Stress: Not on file  Social Connections: Not on file  Intimate Partner Violence: Not on file     Family History  Problem Relation Age of Onset   Cervical cancer Mother    Heart disease  Mother        died of CHF   Heart disease Father        died at 20 of heart attack   Multiple myeloma Sister        spleen involvement   Cancer Sister        bone marrow   Stroke Sister    Colon cancer Neg Hx     ROS: Otherwise negative unless mentioned in HPI  Physical Examination  Vitals:   04/22/22 0810 04/22/22 0931  BP: (!) 153/56 (!) 155/52  Pulse: 96   Resp: 19 18  Temp:  98 F (36.7 C)  SpO2: 95% 97%   Body mass index is 36.81 kg/m.  General:  WDWN in NAD  Gait: Not observed HENT: WNL, normocephalic Pulmonary: normal non-labored breathing, without Rales, rhonchi,  wheezing Cardiac: irregular Abdomen:  soft, NT/ND, no masses Skin: without rashes Vascular Exam/Pulses: Palpable left radial pulse Musculoskeletal: no muscle wasting or atrophy  Neurologic: A&O X 3;  No focal weakness or paresthesias are detected; speech is fluent/normal Psychiatric:  The pt has Normal affect. Lymph:  Unremarkable  CBC    Component Value Date/Time   WBC 14.9 (H) 04/21/2022 2221   RBC 5.28 (H) 04/21/2022 2221   HGB 15.3 (H) 04/21/2022 2221   HGB 10.9 (L) 10/08/2011 1418   HCT 46.5 (H) 04/21/2022 2221   HCT 33.4 (L) 10/08/2011 1418   PLT 229 04/21/2022 2221   PLT 369 10/08/2011 1418   MCV 88.1 04/21/2022 2221   MCV 90.5 04/27/2013 1043   MCV 101.5 (H) 10/08/2011 1418   MCH 29.0 04/21/2022 2221   MCHC 32.9 04/21/2022 2221   RDW 13.4 04/21/2022 2221   RDW 20.2 (H) 10/08/2011 1418   LYMPHSABS 1.8 04/21/2022 2221   LYMPHSABS 2.3 10/08/2011 1418   MONOABS 1.2 (H) 04/21/2022 2221   MONOABS 0.5 10/08/2011 1418   EOSABS 0.0 04/21/2022 2221   EOSABS 0.4 10/08/2011 1418   BASOSABS 0.0 04/21/2022 2221   BASOSABS 0.0 10/08/2011 1418    BMET    Component Value Date/Time   NA 139 04/21/2022 2221   K 2.8 (L) 04/21/2022 2221   CL 101 04/21/2022 2221   CO2 30 04/21/2022 2221   GLUCOSE 180 (H) 04/21/2022 2221   BUN 11 04/21/2022 2221   CREATININE 1.00 04/21/2022 2221    CREATININE 1.00 12/28/2013 1012   CALCIUM 8.7 (L) 04/21/2022 2221   GFRNONAA 57 (L) 04/21/2022 2221   GFRAA >60 12/01/2019 0734    COAGS: Lab Results  Component Value Date   INR 1.05 09/28/2011     Non-Invasive Vascular Imaging:   Left arm arterial duplex demonstrates widely patent brachial artery as well as radial and ulnar.  No hemodynamically significant stenosis noted.  Complete and patent palmar arch.   CTA patent left radial and ulnar artery however no contrast noted into the left hand   ASSESSMENT/PLAN: This is a 81 y.o. female with left arm pain  -CTA concerning for ischemic left hand.  Left upper extremity arterial duplex however demonstrates widely patent left ulnar and radial arteries with complete and patent palmar arch.  On exam she has a palpable radial pulse and a very warm hand with symmetric grip strength.  There is no evidence of ischemia or embolic event.  Nothing further to add from a vascular surgery standpoint.  On-call vascular surgeon Dr. Virl Cagey will evaluate the patient later today and provide further treatment plans.   Dagoberto Ligas PA-C [facility] [phone]  VASCULAR STAFF ADDENDUM: I have independently interviewed and examined the patient. I agree with the above.  Patient seen and examined this afternoon.  Palpable pulses in the wrist both radial and ulnar arteries.  Multiphasic signal in the hand.  Normal waveforms with triphasic signals throughout on upper extremity duplex ultrasound. No concern for vascular etiology of left arm pain.  Cassandria Santee, MD [facility] Phone Number: [phone] 04/22/2022 4:13 PM

## 2022-04-22 NOTE — H&P (Signed)
History and Physical    Patient: Kathryn Thomas HLK:[id number] DOB: 03-Mar-1941 DOA: 04/21/2022 DOS: the patient was seen and examined on 04/22/2022 PCP: Benito Mccreedy, MD  Patient coming from: Home - lives alone; NOK: Daughter, Hassan Rowan, [phone]   Chief Complaint: L arm pain  HPI: Kathryn Thomas is a 81 y.o. female with medical history significant of DM; HTN; stage 3 CKD; HLD; severe lumbar spinal stenosis declined for laminectomy on 5/15 due to new-onset afib who was started on Eliquis 1 month ago presenting with L arm pain.  She reports that her entire left arm from shoulder to fingertips was killing her.  They thought it was because she was related to her BP and she may have checked her BP a million times due to anxiety about this. Her BP was 220/135.  The pain started late Saturday night.  It got better with the patch in the ER.  It hurts with movement.  She started on BP medication in December for the first time.  She saw her PCP maybe a week ago and he had her double the medication because the pressure was >200.  No headache or vision changes.  She thought she has had afib for a while.  She didn't start Eliquis for maybe a couple of weeks after her appointment, was nervous about starting a medication that "I knew I would have to take until I die."  No apparent issues from Eliquis.  She is quite anxious, has been periodic in spells for the last 3 years.  When she has the spell, she can't stay home, can't eat, worries about everything, perseverates.  Family reports that she is not able to eat for maybe a week.  She is not feeling hungry. When she takes a bite or two, especially at bedtime, she lies down and feels like she is refluxing.  Denies long-term issues with GI problems other than GI polyps (saw Dr. Olevia Perches).    ER Course:  [facility] to [facility] transfer, per Dr. Myna Hidalgo:  Accepted from [facility] for undifferentiated left arm pain and suspicion for PE on the part of ED MD. She is an 81 yr old  with HTN, HLD, spinal stenosis, and PAF recently started on Eliquis who presents with left arm pain and swelling since 7/29. O2 sat was 87-94% on rm air and 2 Lpm O2 was started.  Labs notable for potassium 2.8, WBC 14.9, and troponin 80. No acute findings on x-ray of chest or left humerus. CTA of left arm reveals edema in distal arm without gas or abscess, and non-opacification past the wrist which could be related to technical issues or arterial disease. Per ED MD, arms appear the same, pulses are symmetric, and there is no cellulitis. ED spoke with Dr. Stanford Breed of vascular who will consult. ED MD has suspicion for PE but could not give more CT contrast to get CTA and started IV heparin empirically. She was also treated with oral potassium, Toradol, and Dilaudid in ED.      Review of Systems: As mentioned in the history of present illness. All other systems reviewed and are negative. Past Medical History:  Diagnosis Date   Acute renal failure ([place])    Anal stenosis 09/23/2008   Anxiety    Atrial fibrillation (HCC)    Blood transfusion    " no reaction to transfusion "   Depression    Diabetes mellitus without complication (HCC)    Dyslipidemia    External hemorrhoids    Gastric  polyp    Gastritis    Hypertension    Intestinal metaplasia of gastric mucosa 09/24/2011   high grade dysplasia   Pernicious anemia    Shortness of breath    Vitamin B12 deficiency    Past Surgical History:  Procedure Laterality Date   ABDOMINAL HYSTERECTOMY     BACK SURGERY  1980   three lower spine surgeries.   CHOLECYSTECTOMY N/A 11/26/2019   Procedure: LAPAROSCOPIC CHOLECYSTECTOMY WITH INTRAOPERATIVE CHOLANGIOGRAM;  Surgeon: Armandina Gemma, MD;  Location: [facility];  Service: General;  Laterality: N/A;   ERCP N/A 11/27/2019   Procedure: ENDOSCOPIC RETROGRADE CHOLANGIOPANCREATOGRAPHY (ERCP);  Surgeon: Ladene Artist, MD;  Location: [facility];  Service: Endoscopy;  Laterality: N/A;    ESOPHAGOGASTRODUODENOSCOPY  10/01/2011   Procedure: ESOPHAGOGASTRODUODENOSCOPY (EGD);  Surgeon: Lafayette Dragon, MD;  Location: [facility] ENDOSCOPY;  Service: Endoscopy;  Laterality: N/A;   REMOVAL OF STONES  11/27/2019   Procedure: REMOVAL OF STONES;  Surgeon: Ladene Artist, MD;  Location: [facility] ENDOSCOPY;  Service: Endoscopy;;   SPHINCTEROTOMY  11/27/2019   Procedure: Joan Mayans;  Surgeon: Ladene Artist, MD;  Location: [facility] ENDOSCOPY;  Service: Endoscopy;;   Social History:  reports that she has never smoked. She has never used smokeless tobacco. She reports that she does not drink alcohol and does not use drugs.  No Known Allergies  Family History  Problem Relation Age of Onset   Cervical cancer Mother    Heart disease Mother        died of CHF   Heart disease Father        died at 8 of heart attack   Multiple myeloma Sister        spleen involvement   Cancer Sister        bone marrow   Stroke Sister    Colon cancer Neg Hx     Prior to Admission medications   Medication Sig Start Date End Date Taking? Authorizing Provider  acetaminophen (TYLENOL) 325 MG tablet You can take 2 tablets every 4 hours as needed for pain.  Do not take more than 4000 mg of Tylenol per day.  It can harm your liver. You can buy this over the counter at any drug store. 11/29/19   Earnstine Regal, PA-C  ALPRAZolam Duanne Moron) 0.5 MG tablet Take 0.25 mg by mouth 2 (two) times daily as needed for anxiety. 10/27/19   [provider]  amLODipine (NORVASC) 2.5 MG tablet Take 2.5 mg by mouth daily. 09/04/19   [provider]  buPROPion (WELLBUTRIN SR) 150 MG 12 hr tablet Take 1 tablet (150 mg total) by mouth 2 (two) times daily. 02/20/14   Robyn Haber, MD  celecoxib (CELEBREX) 200 MG capsule Talk to your primary care before resuming this medicine.  Your kidney function was elevated here in the hospital. 11/29/19   Earnstine Regal, PA-C  docusate sodium (COLACE) 100 MG capsule Take 1 capsule (100 mg total)  by mouth 2 (two) times daily as needed for mild constipation or moderate constipation (use as needed for constipation). 12/01/19   Earnstine Regal, PA-C  fluticasone [facility]) 50 MCG/ACT nasal spray Place 2 sprays into both nostrils daily. Patient not taking: Reported on 11/25/2019 12/28/13   Orma Flaming, MD  gabapentin (NEURONTIN) 300 MG capsule Take 1 capsule (300 mg total) by mouth 3 (three) times daily as needed for up to 30 doses. 10/23/21   Wyvonnia Dusky, MD  hydrochlorothiazide (MICROZIDE) 12.5 MG capsule Take 1 capsule (12.5 mg  total) by mouth daily. 07/07/14   Harrison Mons, PA  indomethacin (INDOCIN) 25 MG capsule Take 1 capsule (25 mg total) by mouth 3 (three) times daily with meals. 12/01/19   Earnstine Regal, PA-C  lisinopril (PRINIVIL,ZESTRIL) 10 MG tablet TAKE ONE TABLET BY MOUTH ONCE DAILY Patient taking differently: Take 10 mg by mouth daily.  07/06/14   Robyn Haber, MD  ondansetron (ZOFRAN ODT) 4 MG disintegrating tablet Take 1 tablet (4 mg total) by mouth every 8 (eight) hours as needed for nausea or vomiting. 01/27/21   Rayna Sexton, PA-C  oxyCODONE (OXY IR/ROXICODONE) 5 MG immediate release tablet Take 1-2 tablets (5-10 mg total) by mouth every 4 (four) hours as needed for severe pain or breakthrough pain (for pain not relieved by Tylenol). 11/29/19   Earnstine Regal, PA-C  polyethylene glycol (MIRALAX / GLYCOLAX) 17 g packet Take 17 g by mouth daily as needed for mild constipation, moderate constipation or severe constipation (use as needed follow package directions). 12/01/19   Earnstine Regal, PA-C  Vitamin D, Ergocalciferol, (DRISDOL) 50000 UNITS CAPS capsule Take 50,000 Units by mouth every 7 (seven) days. On Wednesday.    [provider]    Physical Exam: Vitals:   04/22/22 0727 04/22/22 0730 04/22/22 0810 04/22/22 0931  BP:  (!) 143/54 (!) 153/56 (!) 155/52  Pulse:  (!) 35 96   Resp:  _0 Temp:    98 F (36.7 C)  TempSrc:    Oral  SpO2:  97% 96% 95% 97%  Weight:    103.1 kg  Height:    _1  (1.702 m)   General:  Appears calm and comfortable and is in NAD Eyes:   EOMI, normal lids, iris ENT:  grossly normal hearing, lips & tongue, mildly dry mm Neck:  no LAD, masses or thyromegaly Cardiovascular:  Irregularly irregular, no m/r/g. No LE edema.  Respiratory:   CTA bilaterally with no wheezes/rales/rhonchi.  Normal respiratory effort. Abdomen:  soft, NT, ND Skin:  no rash or induration seen on limited exam Musculoskeletal:  L trapezius TTP with marked radicular pain down entire left arm; normal color and temperature Psychiatric:  grossly normal/mildly anxious mood and affect, speech fluent and appropriate, AOx3 Neurologic:  CN 2-12 grossly intact, moves all extremities in coordinated fashion   Radiological Exams on Admission: Independently reviewed - see discussion in A/P where applicable  MR CERVICAL SPINE W WO CONTRAST  Result Date: 04/22/2022 CLINICAL DATA:  Myelopathy, acute, cervical spine.  Left arm pain EXAM: MRI CERVICAL SPINE WITHOUT AND WITH CONTRAST TECHNIQUE: Multiplanar and multiecho pulse sequences of the cervical spine, to include the craniocervical junction and cervicothoracic junction, were obtained without and with intravenous contrast. CONTRAST:  60m GADAVIST GADOBUTROL 1 MMOL/ML IV SOLN COMPARISON:  None FINDINGS: Alignment: No malalignment. Vertebrae: No fracture or focal bone lesion. Cord: No cord compression or focal cord lesion. Posterior Fossa, vertebral arteries, paraspinal tissues: Normal Disc levels: No significant finding at the foramen magnum, C1-2 or C2-3. C3-4: Minimal disc bulge and facet hypertrophy. No canal or foraminal stenosis. C4-5, C5-6 and C6-7: Minimal disc bulges. No canal or foraminal stenosis. C7-T1: Normal interspace. IMPRESSION: Mild degenerative changes as above. No advanced finding. No evidence of compressive pathology affecting the cord. No evidence of compressive foraminal  narrowing. Electronically Signed   By: MNelson ChimesM.D.   On: 04/22/2022 17:47   VAS [place]ER EXTREM[place] ART[place]UPLEX  Result Date: 04/22/2022  UPPER EXTREMITY DUPLEX STUDY Patient Name:  BEMA HEBNER  Sak  Date of Exam:   04/22/2022 Medical Rec #: [id number]          Accession #:    [id number] Date of Birth: 11/13/1940          Patient Gender: F Patient Age:   83 years Exam Location:  [facility] Procedure:      VAS [place] UPPER EXTREMITY ARTERIAL DUPLEX Referring Phys: JOSHUA ROBINS --------------------------------------------------------------------------------  Indications: Left upper extremity pain x3 days.  Risk Factors: Hypertension, hyperlipidemia, Diabetes. Comparison Study: No prior study Performing Technologist: Maudry Mayhew MHA, RDMS, RVT, RDCS  Examination Guidelines: A complete evaluation includes B-mode imaging, spectral Doppler, color Doppler, and power Doppler as needed of all accessible portions of each vessel. Bilateral testing is considered an integral part of a complete examination. Limited examinations for reoccurring indications may be performed as noted.  Left Doppler Findings: +---------------+----------+---------+--------+--------+ Site           PSV (cm/s)Waveform StenosisComments +---------------+----------+---------+--------+--------+ Subclavian Prox118       triphasic                 +---------------+----------+---------+--------+--------+ Subclavian Mid 102       triphasic                 +---------------+----------+---------+--------+--------+ Axillary       94        triphasic                 +---------------+----------+---------+--------+--------+ Brachial Prox  145       triphasic                 +---------------+----------+---------+--------+--------+ Brachial Dist  136       triphasic                 +---------------+----------+---------+--------+--------+ Radial Prox    100       triphasic                  +---------------+----------+---------+--------+--------+ Radial Dist    117       triphasic                 +---------------+----------+---------+--------+--------+ Ulnar Prox     73        triphasic                 +---------------+----------+---------+--------+--------+ Ulnar Dist     74        triphasic                 +---------------+----------+---------+--------+--------+ Palmar Arch    76        triphasic                 +---------------+----------+---------+--------+--------+   Summary:  Left: No obstruction visualized in the left upper extremity. *See table(s) above for measurements and observations.    Preliminary    DG Chest Portable 1 View  Result Date: 04/22/2022 CLINICAL DATA:  Left arm swelling EXAM: PORTABLE CHEST 1 VIEW COMPARISON:  11/29/2019 FINDINGS: Shallow inspiration. Heart size and pulmonary vascularity appear normal for technique. Lungs are clear. No pleural effusions. No pneumothorax. Mediastinal contours appear intact. Calcified and tortuous aorta. IMPRESSION: No active disease. Electronically Signed   By: Lucienne Capers M.D.   On: 04/22/2022 00:14   CT ANGIO UP EXTREM LEFT W &/OR WO CONTAST  Result Date: 04/22/2022 CLINICAL DATA:  Left upper extremity pain, swelling and paresthesias. EXAM: CT ANGIOGRAPHY OF THE LEFT UPPEREXTREMITY TECHNIQUE: Multidetector CT imaging of the left upperwas performed  using the standard protocol during bolus administration of intravenous contrast. Multiplanar CT image reconstructions and MIPs were obtained to evaluate the vascular anatomy. RADIATION DOSE REDUCTION: This exam was performed according to the departmental dose-optimization program which includes automated exposure control, adjustment of the mA and/or kV according to patient size and/or use of iterative reconstruction technique. CONTRAST:  161m OMNIPAQUE IOHEXOL 350 MG/ML SOLN COMPARISON:  No prior similar study for comparison. FINDINGS: Technical note: The images  are of poor quality. The study was performed with the patient's arms by her side which results in abundant streak and beam hardening artifact significantly limiting spatial resolution and fine image detail particularly in the forearm, wrist and hand. Technologist indicates best possible study due to patient inability to raise her arm. Left subclavian artery: The visualized portion is normal but the origin from the aortic arch was not included in the scan. Axillary artery: Normal. Brachial artery: There appear to be scattered calcific plaques but there is no flow-limiting stenosis allowing for limited image quality Trifurcation: The radial and ulnar arteries are patent. The interosseous artery is visible as far as the distal third of the forearm but is not seen below the level. Whether this is technique related or primary arterial disease is unclear. Radial and ulnar arterial opacification is seen at least as far as the junction of the wrist and hand, but arterial opacification could not be confirmed in the hand. Venous: The veins are unopacified and not evaluated. Left upper extremity: Nonspecific edema seen in the forearm, wrist and hand but no fluid collection is evident. There is normal muscle bulk with no intramuscular fluid collections. No deep soft tissue fluid collections or soft tissue gas are seen. There is mild nonerosive arthrosis of the [facility] and inferior glenohumeral joint, and involving the first and second carpometacarpal joints. The bone mineralization mildly osteopenic with no evidence of fractures, aggressive periosteal reaction or primary pathologic bone lesion. Other: None. Review of the MIP images confirms the above findings. IMPRESSION: 1. Major arteries are patent as far as the junction of the wrist and hand. Arterial opacification was not seen in the hand. Whether this is due to primary arterial disease or technique-related is unknown. 2. Nonvisualization of interosseous artery  opacification below the distal third of the forearm, also unknown whether this is technique or related to arterial disease. 3. Osteopenia and degenerative change. 4. Edema in the forearm, wrist and hand, nonspecific. No soft tissue gas or visible fluid collection. 5. Limited image detail due to technique and artifacts. Electronically Signed   By: KTelford NabM.D.   On: 04/22/2022 00:07   DG Humerus Left  Result Date: 04/21/2022 CLINICAL DATA:  Left upper extremity pain. EXAM: LEFT HUMERUS - 2+ VIEW COMPARISON:  None Available. FINDINGS: No acute fracture or dislocation. The bones are well mineralized. The soft tissues are unremarkable. IMPRESSION: Negative. Electronically Signed   By: AAnner CreteM.D.   On: 04/21/2022 22:19    EKG: Independently reviewed.  Afib with rate 101; PVCs and IVCD with no evidence of acute ischemia   Labs on Admission: I have personally reviewed the available labs and imaging studies at the time of the admission.  Pertinent labs:    K+ 2.8 Glucose 180 CK 41 HS troponin 80, 110, 60, 54 WBC 14.9 Hgb 15.3 COVID/flu negative UA: small Hgb, >300 protein, rare bacteria   Assessment and Plan: Principal Problem:   Left arm pain Active Problems:   Multiple gastric polyps   Atrial  fibrillation, chronic (HCC)   Uncontrolled hypertension   Anxiety   Anorexia   Diabetes mellitus type 2 in obese (HCC)   Dyslipidemia   Chronic kidney disease, stage 3a (HCC)   Lumbar spinal stenosis    Left arm pain -Patient with acute onset of L arm pain -She went to [facility] and imaging indicated possible limb ischemia -At the time of my evaluation, she appeared to have intact circulation with good color -Instead, she had marked LUE pain with TTP along the L trapezius muscle - suggestive of radiculopathy -Dr. Virl Cagey from vascular surgery consulted and agreed -MRI was ordered and does not show cord compression -Heparin infusion stopped -Pain control with lidocaine,  Robaxin  Afib -Recently diagnosed -She is not currently on rate controlling medication -Continue Eliquis -Elevated troponin is likely related to demand ischemia rather than ACS -Unremarkable echo on 5/15 so will not currently repeat  Uncontrolled HTN -Recently poorly controlled -She has been changed to Cozaar 50 mg BID -Currently with reasonable control, will follow  Anxiety -Certainly her anxiety is contributing to her medical issues - but her medical issues are also exacerbating her anxiety -She has been started on Seroquel -Will also add prn hydroxyzine and prn buspirone for now  Poor PO intake -Family reports difficulty eating for the last week -It is hard to say whether this is from anorexia, dysphagia, or early satiety -This may be related to new onset afib and/or very uncontrolled HTN -For now, will control other medical issues and follow -10/01/11 egd showed multiple gastric polyps with severe gastritis and pathology showed high grade dysplasia; repeat on 12/03/11 with multiple gastric polyps and no high grade dysplasia on path; repeat on 12/30/12 with polyps and atrophic gastritis with path showing H pylori -If not improving, consider GI consult for probable repeat EGD  Hypokalemia -Repleted  DM -Will check A1c -hold Glucophage -Cover with moderate-scale SSI   HLD -Continue Lipitor  Stage 3a CKD -Appears to be stable at this time -Will follow  Lumbar stenosis -Has had laminectomy cancelled twice now by anesthesia - for cellulitis and then afib -Needs surgery when able -Continue Neurontin prn   Advance Care Planning:   Code Status: Full Code   Consults: Vascular surgery; PT/OT  DVT Prophylaxis: Heparin -> Eliquis  Family Communication: 2 daughters and son (in law?) were present throughout evaluation  Severity of Illness: The appropriate patient status for this patient is OBSERVATION. Observation status is judged to be reasonable and necessary in order to  provide the required intensity of service to ensure the patient's safety. The patient's presenting symptoms, physical exam findings, and initial radiographic and laboratory data in the context of their medical condition is felt to place them at decreased risk for further clinical deterioration. Furthermore, it is anticipated that the patient will be medically stable for discharge from the hospital within 2 midnights of admission.   Author: Karmen Bongo, MD 04/22/2022 7:26 PM  For on call review [email or web address].

## 2022-04-22 NOTE — ED Notes (Signed)
[facility] at bedside for transport. 

## 2022-04-23 ENCOUNTER — Other Ambulatory Visit: Payer: Self-pay

## 2022-04-23 DIAGNOSIS — M79602 Pain in left arm: Secondary | ICD-10-CM | POA: Diagnosis not present

## 2022-04-23 LAB — BASIC METABOLIC PANEL
Anion gap: 7 (ref 5–15)
BUN: 17 mg/dL (ref 8–23)
CO2: 26 mmol/L (ref 22–32)
Calcium: 8.6 mg/dL — ABNORMAL LOW (ref 8.9–10.3)
Chloride: 107 mmol/L (ref 98–111)
Creatinine, Ser: 0.9 mg/dL (ref 0.44–1.00)
GFR, Estimated: >60 mL/min (ref >60)
Glucose, Bld: 130 mg/dL — ABNORMAL HIGH (ref 70–99)
Potassium: 3.5 mmol/L (ref 3.5–5.1)
Sodium: 140 mmol/L (ref 135–145)

## 2022-04-23 LAB — CBC
HCT: 41 % (ref 36.0–46.0)
Hemoglobin: 13.4 g/dL (ref 12.0–15.0)
MCH: 29.2 pg (ref 26.0–34.0)
MCHC: 32.7 g/dL (ref 30.0–36.0)
MCV: 89.3 fL (ref 80.0–100.0)
Platelets: 192 10*3/uL (ref 150–400)
RBC: 4.59 MIL/uL (ref 3.87–5.11)
RDW: 13.2 % (ref 11.5–15.5)
WBC: 10.9 10*3/uL — ABNORMAL HIGH (ref 4.0–10.5)
nRBC: 0 % (ref 0.0–0.2)

## 2022-04-23 LAB — GLUCOSE, CAPILLARY
Glucose-Capillary: 137 mg/dL — ABNORMAL HIGH (ref 70–99)
Glucose-Capillary: 155 mg/dL — ABNORMAL HIGH (ref 70–99)

## 2022-04-23 MED ORDER — METOPROLOL SUCCINATE ER 25 MG PO TB24
25.0000 mg | ORAL_TABLET | Freq: Every day | ORAL | 2 refills | Status: AC
Start: 2022-04-23 — End: ?

## 2022-04-23 MED ORDER — LOSARTAN POTASSIUM 100 MG PO TABS
100.0000 mg | ORAL_TABLET | Freq: Every day | ORAL | 2 refills | Status: AC
Start: 2022-04-23 — End: ?

## 2022-04-23 NOTE — Care Management Obs Status (Signed)
[facility] NOTIFICATION   Patient Details  Name: Kathryn Thomas MRN: [id number] Date of Birth: [birth year]   Medicare Observation Status Notification Given:  Yes    Carles Collet, RN 04/23/2022, 10:45 AM

## 2022-04-23 NOTE — Evaluation (Signed)
Physical Therapy Evaluation Patient Details Name: Kathryn Thomas MRN: [id number] DOB: [birth year] Today's Date: 04/23/2022  History of Present Illness  Pt adm 7/31 with lt arm pain. PMH - DM, HTN, ckd, afib, back surgery x 3, spinal stenosis  Clinical Impression  Pt presents to PT with decr mobility primarily due to LUE pain that hinders any movement. Recommend HHPT after dc home and family support as needed.        Recommendations for follow up therapy are one component of a multi-disciplinary discharge planning process, led by the attending physician.  Recommendations may be updated based on patient status, additional functional criteria and insurance authorization.  Follow Up Recommendations Home health PT      Assistance Recommended at Discharge Intermittent Supervision/Assistance  Patient can return home with the following  Help with stairs or ramp for entrance;A little help with walking and/or transfers    Equipment Recommendations None recommended by PT  Recommendations for Other Services       Functional Status Assessment Patient has had a recent decline in their functional status and demonstrates the ability to make significant improvements in function in a reasonable and predictable amount of time.     Precautions / Restrictions Precautions Precautions: Fall Precaution Comments: L arm pain; sensitive to touch      Mobility  Bed Mobility Overal bed mobility: Needs Assistance Bed Mobility: Sit to Supine       Sit to supine: Min assist   General bed mobility comments: Assist to bring legs back up in bed    Transfers Overall transfer level: Needs assistance Equipment used: None Transfers: Sit to/from Stand Sit to Stand: Min guard           General transfer comment: Incr time and effort to rise    Ambulation/Gait Ambulation/Gait assistance: Min guard Gait Distance (Feet): 35 Feet Assistive device: IV Pole Gait Pattern/deviations: Step-through  pattern, Decreased step length - right, Decreased step length - left Gait velocity: decr Gait velocity interpretation: <1.31 ft/sec, indicative of household ambulator   General Gait Details: Assist for safety and lines. Pt limited by UE pain  Stairs            Wheelchair Mobility    Modified Rankin (Stroke Patients Only)       Balance Overall balance assessment: Needs assistance         Standing balance support: No upper extremity supported, During functional activity Standing balance-Leahy Scale: Fair                               Pertinent Vitals/Pain Pain Assessment Pain Assessment: Faces Faces Pain Scale: Hurts even more Pain Location: LUE pain, especially with movement or when it is touched Pain Descriptors / Indicators: Sharp (wherever it's touched) Pain Intervention(s): Limited activity within patient's tolerance    Home Living Family/patient expects to be discharged to:: Private residence Living Arrangements: Alone Available Help at Discharge: Available 24 hours/day Type of Home: House Home Access: Stairs to enter Entrance Stairs-Rails: Psychiatric nurse of Steps: 4   Home Layout: Multi-level;Able to live on main level with bedroom/bathroom Home Equipment: Rolling Walker (2 wheels);Cane - single point;BSC/3in1;Shower seat;Grab bars - tub/shower      Prior Function Prior Level of Function : Independent/Modified Independent;Driving                     Hand Dominance   Dominant Hand: Right  Extremity/Trunk Assessment   Upper Extremity Assessment Upper Extremity Assessment: Defer to OT evaluation LUE Deficits / Details: weak grasp, unable to make "ok" sign; appears neuropraxic at times however does not complain of numbness, just pain; minimal amount of elbow flexion noted, holding arm in extension; not moving shoulder; increased tightness in anterior shoulder;scalene tightness noted; very sensitive to touch;  hurts up into neck/upper traps LUE Coordination: decreased fine motor;decreased gross motor (not using functionally)    Lower Extremity Assessment Lower Extremity Assessment: Generalized weakness    Cervical / Trunk Assessment Cervical / Trunk Assessment: Kyphotic;Other exceptions (rounded shoulders/poor posture; hx of back surgeries)  Communication   Communication: No difficulties  Cognition Arousal/Alertness: Awake/alert Behavior During Therapy: WFL for tasks assessed/performed Overall Cognitive Status: Within Functional Limits for tasks assessed                                          General Comments General comments (skin integrity, edema, etc.): min increase in L hand edema; positioned on pillows; Heat to L upper trap area    Exercises     Assessment/Plan    PT Assessment Patient needs continued PT services  PT Problem List Decreased balance;Decreased mobility;Pain       PT Treatment Interventions DME instruction;Gait training;Functional mobility training;Therapeutic activities;Therapeutic exercise;Balance training;Patient/family education    PT Goals (Current goals can be found in the Care Plan section)  Acute Rehab PT Goals Patient Stated Goal: decr LUE pain PT Goal Formulation: With patient Time For Goal Achievement: 04/30/22 Potential to Achieve Goals: Good    Frequency Min 3X/week     Co-evaluation               AM-PAC PT "6 Clicks" Mobility  Outcome Measure Help needed turning from your back to your side while in a flat bed without using bedrails?: A Little Help needed moving from lying on your back to sitting on the side of a flat bed without using bedrails?: A Little Help needed moving to and from a bed to a chair (including a wheelchair)?: A Little Help needed standing up from a chair using your arms (e.g., wheelchair or bedside chair)?: A Little Help needed to walk in hospital room?: A Little Help needed climbing 3-5 steps  with a railing? : A Little 6 Click Score: 18    End of Session   Activity Tolerance: Patient limited by pain Patient left: in bed;with call bell/phone within reach Nurse Communication: Mobility status PT Visit Diagnosis: Other abnormalities of gait and mobility (R26.89);Difficulty in walking, not elsewhere classified (R26.2);Pain Pain - Right/Left: Left Pain - part of body: Shoulder;Arm;Hand    Time: 7106-2694 PT Time Calculation (min) (ACUTE ONLY): 15 min   Charges:   PT Evaluation $PT Eval Low Complexity: [place] [facility] [place] 04/23/2022, 11:47 AM

## 2022-04-23 NOTE — Evaluation (Signed)
Occupational Therapy Evaluation Patient Details Name: Kathryn Thomas MRN: [id number] DOB: 02/27/1941 Today's Date: 04/23/2022   History of Present Illness Pt adm 7/31 with lt arm pain. PMH - DM, HTN, ckd, afib, back surgery x 3, spinal stenosis   Clinical Impression   PTA pt lives alone independently. Complaining of intense LUE pain which starts in her "neck and radiates down into her hand. Pain worsen's with Adson's Test. Given symptoms, recommend pt follow up with orthopedics to further assess possible impingement or thoracic outlet syndrome. Given functional decline, recommend follow up with [facility]. Acute OT to follow.      Recommendations for follow up therapy are one component of a multi-disciplinary discharge planning process, led by the attending physician.  Recommendations may be updated based on patient status, additional functional criteria and insurance authorization.   Follow Up Recommendations  Home health OT    Assistance Recommended at Discharge Frequent or constant Supervision/Assistance  Patient can return home with the following A little help with walking and/or transfers;A lot of help with bathing/dressing/bathroom;Assistance with cooking/housework;Assist for transportation    Functional Status Assessment  Patient has had a recent decline in their functional status and/or demonstrates limited ability to make significant improvements in function in a reasonable and predictable amount of time  Equipment Recommendations  None recommended by OT    Recommendations for Other Services Other (comment) (orthopedic consult)     Precautions / Restrictions Precautions Precautions: Fall Precaution Comments: L arm pain; sensitive to touch      Mobility Bed Mobility Overal bed mobility: Needs Assistance Bed Mobility: Supine to Sit     Supine to sit: Min assist          Transfers Overall transfer level: Needs assistance Equipment used: 1 person hand held  assist Transfers: Sit to/from Stand Sit to Stand: Min assist           General transfer comment: more difficulty getting up form lower surfaces      Balance Overall balance assessment: Needs assistance           Standing balance-Leahy Scale: Fair                             ADL either performed or assessed with clinical judgement   ADL Overall ADL's : Needs assistance/impaired     Grooming: Minimal assistance   Upper Body Bathing: Minimal assistance   Lower Body Bathing: Moderate assistance   Upper Body Dressing : Moderate assistance   Lower Body Dressing: Moderate assistance   Toilet Transfer: Minimal assistance;Ambulation   Toileting- Clothing Manipulation and Hygiene: Minimal assistance       Functional mobility during ADLs: Minimal assistance General ADL Comments: mobility and function affected by intense UE pain     Vision         Perception     Praxis      Pertinent Vitals/Pain Pain Assessment Pain Assessment: Faces Faces Pain Scale: Hurts even more Pain Location: LUE pain, especially with movement or when it is touched Pain Descriptors / Indicators: Sharp (wherever it's touched) Pain Intervention(s): Limited activity within patient's tolerance     Hand Dominance Right   Extremity/Trunk Assessment Upper Extremity Assessment Upper Extremity Assessment: LUE deficits/detail LUE Deficits / Details: weak grasp, unable to make "ok" sign; appears neuropraxic at times however does not complain of numbness, just pain; minimal amount of elbow flexion noted, holding arm in extension; not moving shoulder; increased tightness  in anterior shoulder;scalene tightness noted; very sensitive to touch; hurts up into neck/upper traps LUE Coordination: decreased fine motor;decreased gross motor (not using functionally)   Lower Extremity Assessment Lower Extremity Assessment: Defer to PT evaluation   Cervical / Trunk Assessment Cervical / Trunk  Assessment: Kyphotic;Other exceptions (rounded shoulders/poor posture; hx of back surgeries)   Communication Communication Communication: No difficulties   Cognition Arousal/Alertness: Awake/alert Behavior During Therapy: WFL for tasks assessed/performed Overall Cognitive Status: Within Functional Limits for tasks assessed                                       General Comments  min increase in L hand edema; positioned on pillows; Heat to L upper trap area    Exercises Exercises: Other exercises Other Exercises Other Exercises: encouraged frequent movement of hand and ROM of LUE as tolerated Other Exercises: encouraged cervical rottion   Shoulder Instructions      Home Living Family/patient expects to be discharged to:: Private residence Living Arrangements: Alone Available Help at Discharge: Available 24 hours/day Type of Home: House Home Access: Stairs to enter [employer] of Steps: 4 Entrance Stairs-Rails: Right;Left Home Layout: Multi-level;Able to live on main level with bedroom/bathroom     Bathroom Shower/Tub: Occupational psychologist: Handicapped height Bathroom Accessibility: Yes How Accessible: Accessible via walker Home Equipment: [place] (2 wheels);Cane - single point;BSC/3in1;Shower seat;Grab bars - tub/shower          Prior Functioning/Environment Prior Level of Function : Independent/Modified Independent;Driving                        OT Problem List: Decreased strength;Decreased range of motion;Decreased activity tolerance;Impaired balance (sitting and/or standing);Decreased coordination;Decreased knowledge of use of DME or AE;Obesity;Impaired UE functional use;Pain;Increased edema      OT Treatment/Interventions: Self-care/ADL training;Therapeutic exercise;Neuromuscular education;DME and/or AE instruction;Therapeutic activities;Manual therapy;Patient/family education;Balance training    OT  Goals(Current goals can be found in the care plan section) Acute Rehab OT Goals Patient Stated Goal: to have pain "go away" OT Goal Formulation: With patient Time For Goal Achievement: 05/07/22 Potential to Achieve Goals: Good  OT Frequency: Min 3X/week    Co-evaluation              AM-PAC OT "6 Clicks" Daily Activity     Outcome Measure Help from another person eating meals?: None Help from another person taking care of personal grooming?: A Little Help from another person toileting, which includes using toliet, bedpan, or urinal?: A Little Help from another person bathing (including washing, rinsing, drying)?: A Lot Help from another person to put on and taking off regular upper body clothing?: A Lot Help from another person to put on and taking off regular lower body clothing?: A Lot 6 Click Score: 16   End of Session Nurse Communication: Mobility status  Activity Tolerance: Patient tolerated treatment well Patient left: in bed;with call bell/phone within reach  OT Visit Diagnosis: Unsteadiness on feet (R26.81);Muscle weakness (generalized) (M62.81);Pain Pain - Right/Left: Left Pain - part of body: Arm;Shoulder;Hand                Time: 4235-3614 OT Time Calculation (min): 30 min Charges:  OT General Charges $OT Visit: 1 Visit OT Evaluation $OT Eval Low Complexity: 1 Low OT Treatments $Self Care/Home Management : 8-22 mins  Tanyah Debruyne, OT/L   Acute OT Clinical  Specialist [place] Pager [phone] Office [phone]   [facility] 04/23/2022, 10:18 AM

## 2022-04-23 NOTE — TOC Transition Note (Signed)
Transition of Care [facility]) - CM/SW Discharge Note   Patient Details  Name: Kathryn Thomas MRN: [id number] Date of Birth: [birth year]  [facility]) CM/SW Contact:  Zenon Mayo, RN Phone Number: 04/23/2022, 11:06 AM   Clinical Narrative:    Patient is for possible dc today, NCM offered choice for HHPT, [facility], she does not have a preference.  NCM made referral to Brandi/Kelly with [facility], they are able to take referral.  Soc will begin tomorrow.  Patient states she has transport at dc.    Final next level of care: [place] Barriers to Discharge: No Barriers Identified   Patient Goals and CMS Choice Patient states their goals for this hospitalization and ongoing recovery are:: return home CMS Medicare.gov Compare Post Acute Care list provided to:: Patient Choice offered to / list presented to : Patient  Discharge Placement                       Discharge Plan and Services                  DME Agency: NA       HH Arranged: OT, PT [place] Agency: [place] Date [place]: 04/23/22 Time [place]: [id number] Representative spoke with at [place]: International aid/development worker  Social Determinants of Health ([place]) Interventions     Readmission Risk Interventions     No data to display

## 2022-04-23 NOTE — Discharge Summary (Signed)
Physician Discharge Summary  Kathryn Thomas TIW:[id number] DOB: [birth year] DOA: 04/21/2022  PCP: Benito Mccreedy, MD  Admit date: 04/21/2022 Discharge date: 04/23/2022  Admitted From: Home Disposition: Home  Recommendations for Outpatient Follow-up:  Follow up with PCP in 1-2 weeks Follow-up with orthopedic surgery as discussed if arm pain continues  Home Health: PT Equipment/Devices: None  Discharge Condition: Stable CODE STATUS: Full Diet recommendation: Low-salt low-fat diet  Brief/Interim Summary: Kathryn Thomas is a 81 y.o. female with medical history significant of DM; HTN; stage 3 CKD; HLD; severe lumbar spinal stenosis declined for laminectomy on 5/15 due to new-onset afib who was started on Eliquis 1 month ago presenting with L arm pain.  Patient admitted as above with elevated blood pressure in the setting of anxiety and recent medication changes, resolving on current regimen as below including losartan metoprolol.  Patient's left arm pain is likely due to trauma in the setting of recurrent blood pressure cuff usage prior to admission in the setting of hypertensive crisis.  Imaging of patient's neck and arm as well as vasculature was noted to be unremarkable, vascular surgery consulted initially due to concerns over vascular compromise -reassuringly work-up was negative.  Patient otherwise stable and agreeable for follow-up with PCP next week.  Discussed that if arm pain continues in into the weekend would follow-up with PCP and/or her previous orthopedic surgeon for further evaluation.  Discharge Diagnoses:  Principal Problem:   Left arm pain Active Problems:   Multiple gastric polyps   Atrial fibrillation, chronic (HCC)   Uncontrolled hypertension   Anxiety   Anorexia   Diabetes mellitus type 2 in obese (HCC)   Dyslipidemia   Chronic kidney disease, stage 3a ([place])   Lumbar spinal stenosis    Discharge Instructions   Allergies as of 04/23/2022   No Known  Allergies      Medication List     STOP taking these medications    gabapentin 300 MG capsule Commonly known as: Neurontin       TAKE these medications    acetaminophen 325 MG tablet Commonly known as: TYLENOL You can take 2 tablets every 4 hours as needed for pain.  Do not take more than 4000 mg of Tylenol per day.  It can harm your liver. You can buy this over the counter at any drug store. What changed:  how much to take how to take this when to take this reasons to take this additional instructions   atorvastatin 20 MG tablet Commonly known as: LIPITOR Take 20 mg by mouth daily.   Eliquis 5 MG Tabs tablet Generic drug: apixaban Take 5 mg by mouth 2 (two) times daily.   losartan 100 MG tablet Commonly known as: COZAAR Take 1 tablet (100 mg total) by mouth daily. What changed: Another medication with the same name was removed. Continue taking this medication, and follow the directions you see here.   metFORMIN 500 MG 24 hr tablet Commonly known as: GLUCOPHAGE-XR Take 500 mg by mouth daily.   metoprolol succinate 25 MG 24 hr tablet Commonly known as: TOPROL-XL Take 1 tablet (25 mg total) by mouth daily.   QUEtiapine 50 MG tablet Commonly known as: SEROQUEL Take 50 mg by mouth 2 (two) times daily.   Vitamin D (Ergocalciferol) 1.25 MG (50000 UNIT) Caps capsule Commonly known as: DRISDOL Take 50,000 Units by mouth every 7 (seven) days. On Wednesday.        Follow-up Information     Benito Mccreedy, MD. Daphane Shepherd  on 05/02/2022.   Specialty: Internal Medicine Why: '@10'$ :00am Contact information: [address] [place] [address] [phone]         [place]. Go to .   Specialty: Emergency Medicine Why: As needed, If symptoms worsen Contact information: [place] [phone] [address] [place], [place] Follow up.   Specialty: Home  Health Services Why: [facility] will call you to set up apt times Contact information: [place] [address] [phone]                No Known Allergies  Consultations: Vascular surgery   Procedures/Studies: VAS [place] UPPER EXTREMITY ARTERIAL DUPLEX  Result Date: 04/23/2022  UPPER EXTREMITY DUPLEX STUDY Patient Name:  Kathryn Thomas  Date of Exam:   04/22/2022 Medical Rec #: [id number]          Accession #:    [id number] Date of Birth: [birth year]          Patient Gender: F Patient Age:   81 years Exam Location:  [facility] Procedure:      VAS [place] UPPER EXTREMITY ARTERIAL DUPLEX Referring Phys: Vonna Kotyk ROBINS --------------------------------------------------------------------------------  Indications: Left upper extremity pain x3 days.  Risk Factors: Hypertension, hyperlipidemia, Diabetes. Comparison Study: No prior study Performing Technologist: Maudry Mayhew MHA, RDMS, RVT, RDCS  Examination Guidelines: A complete evaluation includes B-mode imaging, spectral Doppler, color Doppler, and power Doppler as needed of all accessible portions of each vessel. Bilateral testing is considered an integral part of a complete examination. Limited examinations for reoccurring indications may be performed as noted.  Left Doppler Findings: +---------------+----------+---------+--------+--------+ Site           PSV (cm/s)Waveform StenosisComments +---------------+----------+---------+--------+--------+ Subclavian Prox118       triphasic                 +---------------+----------+---------+--------+--------+ Subclavian Mid 102       triphasic                 +---------------+----------+---------+--------+--------+ Axillary       94        triphasic                 +---------------+----------+---------+--------+--------+ Brachial Prox  145       triphasic                 +---------------+----------+---------+--------+--------+ Brachial  Dist  136       triphasic                 +---------------+----------+---------+--------+--------+ Radial Prox    100       triphasic                 +---------------+----------+---------+--------+--------+ Radial Dist    117       triphasic                 +---------------+----------+---------+--------+--------+ Ulnar Prox     73        triphasic                 +---------------+----------+---------+--------+--------+ Ulnar Dist     74        triphasic                 +---------------+----------+---------+--------+--------+ Palmar Arch    76        triphasic                 +---------------+----------+---------+--------+--------+  Summary:  Left: No obstruction visualized in the left upper extremity. *See table(s) above for measurements and observations. Electronically signed by Orlie Pollen on 04/23/2022 at 12:47:48 PM.    Final    MR CERVICAL SPINE W WO CONTRAST  Result Date: 04/22/2022 CLINICAL DATA:  Myelopathy, acute, cervical spine.  Left arm pain EXAM: MRI CERVICAL SPINE WITHOUT AND WITH CONTRAST TECHNIQUE: Multiplanar and multiecho pulse sequences of the cervical spine, to include the craniocervical junction and cervicothoracic junction, were obtained without and with intravenous contrast. CONTRAST:  73m GADAVIST GADOBUTROL 1 MMOL/ML IV SOLN COMPARISON:  None FINDINGS: Alignment: No malalignment. Vertebrae: No fracture or focal bone lesion. Cord: No cord compression or focal cord lesion. Posterior Fossa, vertebral arteries, paraspinal tissues: Normal Disc levels: No significant finding at the foramen magnum, C1-2 or C2-3. C3-4: Minimal disc bulge and facet hypertrophy. No canal or foraminal stenosis. C4-5, C5-6 and C6-7: Minimal disc bulges. No canal or foraminal stenosis. C7-T1: Normal interspace. IMPRESSION: Mild degenerative changes as above. No advanced finding. No evidence of compressive pathology affecting the cord. No evidence of compressive foraminal  narrowing. Electronically Signed   By: MNelson ChimesM.D.   On: 04/22/2022 17:47   DG Chest Portable 1 View  Result Date: 04/22/2022 CLINICAL DATA:  Left arm swelling EXAM: PORTABLE CHEST 1 VIEW COMPARISON:  11/29/2019 FINDINGS: Shallow inspiration. Heart size and pulmonary vascularity appear normal for technique. Lungs are clear. No pleural effusions. No pneumothorax. Mediastinal contours appear intact. Calcified and tortuous aorta. IMPRESSION: No active disease. Electronically Signed   By: WLucienne CapersM.D.   On: 04/22/2022 00:14   CT ANGIO UP EXTREM LEFT W &/OR WO CONTAST  Result Date: 04/22/2022 CLINICAL DATA:  Left upper extremity pain, swelling and paresthesias. EXAM: CT ANGIOGRAPHY OF THE LEFT UPPEREXTREMITY TECHNIQUE: Multidetector CT imaging of the left upperwas performed using the standard protocol during bolus administration of intravenous contrast. Multiplanar CT image reconstructions and MIPs were obtained to evaluate the vascular anatomy. RADIATION DOSE REDUCTION: This exam was performed according to the departmental dose-optimization program which includes automated exposure control, adjustment of the mA and/or kV according to patient size and/or use of iterative reconstruction technique. CONTRAST:  1022mOMNIPAQUE IOHEXOL 350 MG/ML SOLN COMPARISON:  No prior similar study for comparison. FINDINGS: Technical note: The images are of poor quality. The study was performed with the patient's arms by her side which results in abundant streak and beam hardening artifact significantly limiting spatial resolution and fine image detail particularly in the forearm, wrist and hand. Technologist indicates best possible study due to patient inability to raise her arm. Left subclavian artery: The visualized portion is normal but the origin from the aortic arch was not included in the scan. Axillary artery: Normal. Brachial artery: There appear to be scattered calcific plaques but there is no  flow-limiting stenosis allowing for limited image quality Trifurcation: The radial and ulnar arteries are patent. The interosseous artery is visible as far as the distal third of the forearm but is not seen below the level. Whether this is technique related or primary arterial disease is unclear. Radial and ulnar arterial opacification is seen at least as far as the junction of the wrist and hand, but arterial opacification could not be confirmed in the hand. Venous: The veins are unopacified and not evaluated. Left upper extremity: Nonspecific edema seen in the forearm, wrist and hand but no fluid collection is evident. There is normal muscle bulk with no intramuscular fluid collections. No deep soft tissue  fluid collections or soft tissue gas are seen. There is mild nonerosive arthrosis of the [facility] joint and inferior glenohumeral joint, and involving the first and second carpometacarpal joints. The bone mineralization mildly osteopenic with no evidence of fractures, aggressive periosteal reaction or primary pathologic bone lesion. Other: None. Review of the MIP images confirms the above findings. IMPRESSION: 1. Major arteries are patent as far as the junction of the wrist and hand. Arterial opacification was not seen in the hand. Whether this is due to primary arterial disease or technique-related is unknown. 2. Nonvisualization of interosseous artery opacification below the distal third of the forearm, also unknown whether this is technique or related to arterial disease. 3. Osteopenia and degenerative change. 4. Edema in the forearm, wrist and hand, nonspecific. No soft tissue gas or visible fluid collection. 5. Limited image detail due to technique and artifacts. Electronically Signed   By: Telford Nab M.D.   On: 04/22/2022 00:07   DG Humerus Left  Result Date: 04/21/2022 CLINICAL DATA:  Left upper extremity pain. EXAM: LEFT HUMERUS - 2+ VIEW COMPARISON:  None Available. FINDINGS: No acute fracture or  dislocation. The bones are well mineralized. The soft tissues are unremarkable. IMPRESSION: Negative. Electronically Signed   By: Anner Crete M.D.   On: 04/21/2022 22:19     Subjective: No acute issues or events overnight, pain moderately improving without overt deficit although range of motion limited by pain.   Discharge Exam: Vitals:   04/23/22 0337 04/23/22 1058  BP: 136/75   Pulse: 88 79  Resp: 18 19  Temp: 99.1 F (37.3 C) 98.4 F (36.9 C)  SpO2: 92% 94%   Vitals:   04/22/22 0931 04/22/22 2019 04/23/22 0337 04/23/22 1058  BP: (!) 155/52 (!) 145/54 136/75   Pulse:  (!) 41 88 79  Resp: '18 18 18 19  '$ Temp: 98 F (36.7 C) 98.6 F (37 C) 99.1 F (37.3 C) 98.4 F (36.9 C)  TempSrc: Oral Oral Oral Oral  SpO2: 97% 93% 92% 94%  Weight: 103.1 kg  104.9 kg   Height: '5\' 7"'$  (1.702 m)      General:  Pleasantly resting in bed, No acute distress. HEENT:  Normocephalic atraumatic.  Sclerae nonicteric, noninjected.  Extraocular movements intact bilaterally. Neck:  Without mass or deformity.  Trachea is midline.  Tender to palpate left posterolaterally at the paraspinal muscles Lungs:  Clear to auscultate bilaterally without rhonchi, wheeze, or rales. Heart:  Regular rate and rhythm.  Without murmurs, rubs, or gallops. Abdomen:  Soft, nontender, nondistended.  Without guarding or rebound. Extremities: Without cyanosis, clubbing, edema, or obvious deformity.  Left upper extremity tender to palpation, PMI appears to be proximal arm Vascular:  Dorsalis pedis and posterior tibial pulses palpable bilaterally. Skin:  Warm and dry, no erythema, no ulcerations.   The results of significant diagnostics from this hospitalization (including imaging, microbiology, ancillary and laboratory) are listed below for reference.     Microbiology: Recent Results (from the past 240 hour(s))  Resp Panel by RT-PCR (Flu A&B, Covid) Anterior Nasal Swab     Status: None   Collection Time: 04/21/22 11:50  PM   Specimen: Anterior Nasal Swab  Result Value Ref Range Status   SARS Coronavirus 2 by RT PCR NEGATIVE NEGATIVE Final    Comment: (NOTE) SARS-CoV-2 target nucleic acids are NOT DETECTED.  The SARS-CoV-2 RNA is generally detectable in upper respiratory specimens during the acute phase of infection. The lowest concentration of SARS-CoV-2 viral copies this assay  can detect is 138 copies/mL. A negative result does not preclude SARS-Cov-2 infection and should not be used as the sole basis for treatment or other patient management decisions. A negative result may occur with  improper specimen collection/handling, submission of specimen other than nasopharyngeal swab, presence of viral mutation(s) within the areas targeted by this assay, and inadequate number of viral copies(<138 copies/mL). A negative result must be combined with clinical observations, patient history, and epidemiological information. The expected result is Negative.  Fact Sheet for Patients:  EntrepreneurPulse.com.au  Fact Sheet for Healthcare Providers:  IncredibleEmployment.be  This test is no t yet approved or cleared by the [place] FDA and  has been authorized for detection and/or diagnosis of SARS-CoV-2 by FDA under an Emergency Use Authorization (EUA). This EUA will remain  in effect (meaning this test can be used) for the duration of the COVID-19 declaration under Section 564(b)(1) of the Act, 21 U.S.C.section 360bbb-3(b)(1), unless the authorization is terminated  or revoked sooner.       Influenza A by PCR NEGATIVE NEGATIVE Final   Influenza B by PCR NEGATIVE NEGATIVE Final    Comment: (NOTE) The Xpert Xpress SARS-CoV-2/FLU/RSV plus assay is intended as an aid in the diagnosis of influenza from Nasopharyngeal swab specimens and should not be used as a sole basis for treatment. Nasal washings and aspirates are unacceptable for Xpert Xpress  SARS-CoV-2/FLU/RSV testing.  Fact Sheet for Patients: EntrepreneurPulse.com.au  Fact Sheet for Healthcare Providers: IncredibleEmployment.be  This test is not yet approved or cleared by the [place] FDA and has been authorized for detection and/or diagnosis of SARS-CoV-2 by FDA under an Emergency Use Authorization (EUA). This EUA will remain in effect (meaning this test can be used) for the duration of the COVID-19 declaration under Section 564(b)(1) of the Act, 21 U.S.C. section 360bbb-3(b)(1), unless the authorization is terminated or revoked.  Performed at [facility], [address] [place], [place] [address]      Labs: BNP (last 3 results) No results for input(s): "BNP" in the last 8760 hours. Basic Metabolic Panel: Recent Labs  Lab 04/21/22 2221 04/23/22 0321  NA 139 140  K 2.8* 3.5  CL 101 107  CO2 30 26  GLUCOSE 180* 130*  BUN 11 17  CREATININE 1.00 0.90  CALCIUM 8.7* 8.6*   Liver Function Tests: No results for input(s): "AST", "ALT", "ALKPHOS", "BILITOT", "PROT", "ALBUMIN" in the last 168 hours. No results for input(s): "LIPASE", "AMYLASE" in the last 168 hours. No results for input(s): "AMMONIA" in the last 168 hours. CBC: Recent Labs  Lab 04/21/22 2221 04/23/22 0321  WBC 14.9* 10.9*  NEUTROABS 11.9*  --   HGB 15.3* 13.4  HCT 46.5* 41.0  MCV 88.1 89.3  PLT 229 192   Cardiac Enzymes: Recent Labs  Lab 04/21/22 2221  CKTOTAL 41   BNP: Invalid input(s): "POCBNP" CBG: Recent Labs  Lab 04/22/22 1333 04/22/22 1633 04/22/22 2246 04/23/22 0640 04/23/22 1218  GLUCAP 130* 197* 140* 137* 155*   D-Dimer No results for input(s): "DDIMER" in the last 72 hours. Hgb A1c Recent Labs    04/22/22 1234  HGBA1C 6.7*   Lipid Profile No results for input(s): "CHOL", "HDL", "LDLCALC", "TRIG", "CHOLHDL", "LDLDIRECT" in the last 72 hours. Thyroid function studies No results for input(s): "TSH",  "T4TOTAL", "T3FREE", "THYROIDAB" in the last 72 hours.  Invalid input(s): "FREET3" Anemia work up No results for input(s): "VITAMINB12", "FOLATE", "FERRITIN", "TIBC", "IRON", "RETICCTPCT" in the last 72 hours. Urinalysis  Component Value Date/Time   COLORURINE YELLOW 04/22/2022 0053   APPEARANCEUR CLEAR 04/22/2022 0053   LABSPEC 1.015 04/22/2022 0053   PHURINE 6.5 04/22/2022 0053   GLUCOSEU NEGATIVE 04/22/2022 0053   HGBUR SMALL (A) 04/22/2022 0053   BILIRUBINUR NEGATIVE 04/22/2022 0053   BILIRUBINUR neg 04/27/2013 1044   KETONESUR NEGATIVE 04/22/2022 0053   PROTEINUR >=300 (A) 04/22/2022 0053   UROBILINOGEN 0.2 05/13/2013 1539   NITRITE NEGATIVE 04/22/2022 0053   LEUKOCYTESUR NEGATIVE 04/22/2022 0053   Sepsis Labs Recent Labs  Lab 04/21/22 2221 04/23/22 0321  WBC 14.9* 10.9*   Microbiology Recent Results (from the past 240 hour(s))  Resp Panel by RT-PCR (Flu A&B, Covid) Anterior Nasal Swab     Status: None   Collection Time: 04/21/22 11:50 PM   Specimen: Anterior Nasal Swab  Result Value Ref Range Status   SARS Coronavirus 2 by RT PCR NEGATIVE NEGATIVE Final    Comment: (NOTE) SARS-CoV-2 target nucleic acids are NOT DETECTED.  The SARS-CoV-2 RNA is generally detectable in upper respiratory specimens during the acute phase of infection. The lowest concentration of SARS-CoV-2 viral copies this assay can detect is 138 copies/mL. A negative result does not preclude SARS-Cov-2 infection and should not be used as the sole basis for treatment or other patient management decisions. A negative result may occur with  improper specimen collection/handling, submission of specimen other than nasopharyngeal swab, presence of viral mutation(s) within the areas targeted by this assay, and inadequate number of viral copies(<138 copies/mL). A negative result must be combined with clinical observations, patient history, and epidemiological information. The expected result is  Negative.  Fact Sheet for Patients:  EntrepreneurPulse.com.au  Fact Sheet for Healthcare Providers:  IncredibleEmployment.be  This test is no t yet approved or cleared by the [place] FDA and  has been authorized for detection and/or diagnosis of SARS-CoV-2 by FDA under an Emergency Use Authorization (EUA). This EUA will remain  in effect (meaning this test can be used) for the duration of the COVID-19 declaration under Section 564(b)(1) of the Act, 21 U.S.C.section 360bbb-3(b)(1), unless the authorization is terminated  or revoked sooner.       Influenza A by PCR NEGATIVE NEGATIVE Final   Influenza B by PCR NEGATIVE NEGATIVE Final    Comment: (NOTE) The Xpert Xpress SARS-CoV-2/FLU/RSV plus assay is intended as an aid in the diagnosis of influenza from Nasopharyngeal swab specimens and should not be used as a sole basis for treatment. Nasal washings and aspirates are unacceptable for Xpert Xpress SARS-CoV-2/FLU/RSV testing.  Fact Sheet for Patients: EntrepreneurPulse.com.au  Fact Sheet for Healthcare Providers: IncredibleEmployment.be  This test is not yet approved or cleared by the [place] FDA and has been authorized for detection and/or diagnosis of SARS-CoV-2 by FDA under an Emergency Use Authorization (EUA). This EUA will remain in effect (meaning this test can be used) for the duration of the COVID-19 declaration under Section 564(b)(1) of the Act, 21 U.S.C. section 360bbb-3(b)(1), unless the authorization is terminated or revoked.  Performed at [facility], [address] [place], [place] [address]      Time coordinating discharge: Over 30 minutes  SIGNED:   Little Ishikawa, DO [facility] 04/23/2022, 12:48 PM Pager   If 7PM-7AM, please contact night-coverage [email or web address]

## 2022-05-02 DIAGNOSIS — E559 Vitamin D deficiency, unspecified: Secondary | ICD-10-CM | POA: Diagnosis not present

## 2022-05-02 DIAGNOSIS — R001 Bradycardia, unspecified: Secondary | ICD-10-CM | POA: Diagnosis not present

## 2022-05-02 DIAGNOSIS — F419 Anxiety disorder, unspecified: Secondary | ICD-10-CM | POA: Diagnosis not present

## 2022-05-02 DIAGNOSIS — J301 Allergic rhinitis due to pollen: Secondary | ICD-10-CM | POA: Diagnosis not present

## 2022-05-02 DIAGNOSIS — E782 Mixed hyperlipidemia: Secondary | ICD-10-CM | POA: Diagnosis not present

## 2022-05-02 DIAGNOSIS — R69 Illness, unspecified: Secondary | ICD-10-CM | POA: Diagnosis not present

## 2022-05-02 DIAGNOSIS — E1165 Type 2 diabetes mellitus with hyperglycemia: Secondary | ICD-10-CM | POA: Diagnosis not present

## 2022-05-02 DIAGNOSIS — I10 Essential (primary) hypertension: Secondary | ICD-10-CM | POA: Diagnosis not present

## 2022-06-18 DIAGNOSIS — E782 Mixed hyperlipidemia: Secondary | ICD-10-CM | POA: Diagnosis not present

## 2022-06-18 DIAGNOSIS — E559 Vitamin D deficiency, unspecified: Secondary | ICD-10-CM | POA: Diagnosis not present

## 2022-06-18 DIAGNOSIS — R69 Illness, unspecified: Secondary | ICD-10-CM | POA: Diagnosis not present

## 2022-06-18 DIAGNOSIS — E1165 Type 2 diabetes mellitus with hyperglycemia: Secondary | ICD-10-CM | POA: Diagnosis not present

## 2022-06-18 DIAGNOSIS — I10 Essential (primary) hypertension: Secondary | ICD-10-CM | POA: Diagnosis not present

## 2022-06-19 DIAGNOSIS — I480 Paroxysmal atrial fibrillation: Secondary | ICD-10-CM | POA: Diagnosis not present

## 2022-06-19 DIAGNOSIS — I493 Ventricular premature depolarization: Secondary | ICD-10-CM | POA: Diagnosis not present

## 2022-06-19 DIAGNOSIS — R600 Localized edema: Secondary | ICD-10-CM | POA: Diagnosis not present

## 2022-06-19 DIAGNOSIS — E876 Hypokalemia: Secondary | ICD-10-CM | POA: Diagnosis not present

## 2022-06-28 DIAGNOSIS — E785 Hyperlipidemia, unspecified: Secondary | ICD-10-CM | POA: Diagnosis not present

## 2022-06-28 DIAGNOSIS — E119 Type 2 diabetes mellitus without complications: Secondary | ICD-10-CM | POA: Diagnosis not present

## 2022-06-28 DIAGNOSIS — I480 Paroxysmal atrial fibrillation: Secondary | ICD-10-CM | POA: Diagnosis not present

## 2022-06-28 DIAGNOSIS — G9389 Other specified disorders of brain: Secondary | ICD-10-CM | POA: Diagnosis not present

## 2022-06-28 DIAGNOSIS — I119 Hypertensive heart disease without heart failure: Secondary | ICD-10-CM | POA: Diagnosis not present

## 2022-06-28 DIAGNOSIS — I700 Atherosclerosis of aorta: Secondary | ICD-10-CM | POA: Diagnosis not present

## 2022-06-28 DIAGNOSIS — I7121 Aneurysm of the ascending aorta, without rupture: Secondary | ICD-10-CM | POA: Diagnosis not present

## 2022-06-28 DIAGNOSIS — I5189 Other ill-defined heart diseases: Secondary | ICD-10-CM | POA: Diagnosis not present

## 2022-07-09 DIAGNOSIS — E876 Hypokalemia: Secondary | ICD-10-CM | POA: Diagnosis not present

## 2022-07-09 DIAGNOSIS — I493 Ventricular premature depolarization: Secondary | ICD-10-CM | POA: Diagnosis not present

## 2022-07-22 DIAGNOSIS — R600 Localized edema: Secondary | ICD-10-CM | POA: Diagnosis not present

## 2022-07-22 DIAGNOSIS — I5020 Unspecified systolic (congestive) heart failure: Secondary | ICD-10-CM | POA: Diagnosis not present

## 2022-07-22 DIAGNOSIS — R2243 Localized swelling, mass and lump, lower limb, bilateral: Secondary | ICD-10-CM | POA: Diagnosis not present

## 2022-07-22 DIAGNOSIS — Z0100 Encounter for examination of eyes and vision without abnormal findings: Secondary | ICD-10-CM | POA: Diagnosis not present

## 2022-07-31 DIAGNOSIS — I493 Ventricular premature depolarization: Secondary | ICD-10-CM | POA: Diagnosis not present

## 2022-07-31 DIAGNOSIS — I5020 Unspecified systolic (congestive) heart failure: Secondary | ICD-10-CM | POA: Diagnosis not present

## 2022-07-31 DIAGNOSIS — I480 Paroxysmal atrial fibrillation: Secondary | ICD-10-CM | POA: Diagnosis not present

## 2022-07-31 DIAGNOSIS — E876 Hypokalemia: Secondary | ICD-10-CM | POA: Diagnosis not present

## 2022-08-09 DIAGNOSIS — I5020 Unspecified systolic (congestive) heart failure: Secondary | ICD-10-CM | POA: Diagnosis not present

## 2022-08-12 DIAGNOSIS — I493 Ventricular premature depolarization: Secondary | ICD-10-CM | POA: Diagnosis not present

## 2022-08-12 DIAGNOSIS — I480 Paroxysmal atrial fibrillation: Secondary | ICD-10-CM | POA: Diagnosis not present

## 2022-08-28 DIAGNOSIS — M48062 Spinal stenosis, lumbar region with neurogenic claudication: Secondary | ICD-10-CM | POA: Diagnosis not present

## 2022-08-28 DIAGNOSIS — Z9049 Acquired absence of other specified parts of digestive tract: Secondary | ICD-10-CM | POA: Diagnosis not present

## 2022-08-28 DIAGNOSIS — I480 Paroxysmal atrial fibrillation: Secondary | ICD-10-CM | POA: Diagnosis not present

## 2022-08-28 DIAGNOSIS — Z5181 Encounter for therapeutic drug level monitoring: Secondary | ICD-10-CM | POA: Diagnosis not present

## 2022-08-28 DIAGNOSIS — Z90710 Acquired absence of both cervix and uterus: Secondary | ICD-10-CM | POA: Diagnosis not present

## 2022-08-28 DIAGNOSIS — Z9842 Cataract extraction status, left eye: Secondary | ICD-10-CM | POA: Diagnosis not present

## 2022-08-28 DIAGNOSIS — N183 Chronic kidney disease, stage 3 unspecified: Secondary | ICD-10-CM | POA: Diagnosis not present

## 2022-08-28 DIAGNOSIS — E785 Hyperlipidemia, unspecified: Secondary | ICD-10-CM | POA: Diagnosis not present

## 2022-08-28 DIAGNOSIS — Z9841 Cataract extraction status, right eye: Secondary | ICD-10-CM | POA: Diagnosis not present

## 2022-08-28 DIAGNOSIS — Z961 Presence of intraocular lens: Secondary | ICD-10-CM | POA: Diagnosis not present

## 2022-08-28 DIAGNOSIS — I110 Hypertensive heart disease with heart failure: Secondary | ICD-10-CM | POA: Diagnosis not present

## 2022-08-28 DIAGNOSIS — E1122 Type 2 diabetes mellitus with diabetic chronic kidney disease: Secondary | ICD-10-CM | POA: Diagnosis not present

## 2022-08-28 DIAGNOSIS — Z7984 Long term (current) use of oral hypoglycemic drugs: Secondary | ICD-10-CM | POA: Diagnosis not present

## 2022-08-28 DIAGNOSIS — R69 Illness, unspecified: Secondary | ICD-10-CM | POA: Diagnosis not present

## 2022-08-28 DIAGNOSIS — Z7901 Long term (current) use of anticoagulants: Secondary | ICD-10-CM | POA: Diagnosis not present

## 2022-08-28 DIAGNOSIS — I493 Ventricular premature depolarization: Secondary | ICD-10-CM | POA: Diagnosis not present

## 2022-08-28 DIAGNOSIS — E119 Type 2 diabetes mellitus without complications: Secondary | ICD-10-CM | POA: Diagnosis not present

## 2022-08-28 DIAGNOSIS — Z79899 Other long term (current) drug therapy: Secondary | ICD-10-CM | POA: Diagnosis not present

## 2022-08-28 DIAGNOSIS — Z8249 Family history of ischemic heart disease and other diseases of the circulatory system: Secondary | ICD-10-CM | POA: Diagnosis not present

## 2022-08-28 DIAGNOSIS — I5022 Chronic systolic (congestive) heart failure: Secondary | ICD-10-CM | POA: Diagnosis not present

## 2022-08-28 DIAGNOSIS — I130 Hypertensive heart and chronic kidney disease with heart failure and stage 1 through stage 4 chronic kidney disease, or unspecified chronic kidney disease: Secondary | ICD-10-CM | POA: Diagnosis not present

## 2022-08-28 DIAGNOSIS — I5020 Unspecified systolic (congestive) heart failure: Secondary | ICD-10-CM | POA: Diagnosis not present

## 2022-08-28 DIAGNOSIS — I428 Other cardiomyopathies: Secondary | ICD-10-CM | POA: Diagnosis not present

## 2022-08-28 DIAGNOSIS — Z87891 Personal history of nicotine dependence: Secondary | ICD-10-CM | POA: Diagnosis not present

## 2022-08-30 DIAGNOSIS — Z7901 Long term (current) use of anticoagulants: Secondary | ICD-10-CM | POA: Diagnosis not present

## 2022-08-30 DIAGNOSIS — E119 Type 2 diabetes mellitus without complications: Secondary | ICD-10-CM | POA: Diagnosis not present

## 2022-08-30 DIAGNOSIS — I493 Ventricular premature depolarization: Secondary | ICD-10-CM | POA: Diagnosis not present

## 2022-08-30 DIAGNOSIS — E785 Hyperlipidemia, unspecified: Secondary | ICD-10-CM | POA: Diagnosis not present

## 2022-08-30 DIAGNOSIS — Z5181 Encounter for therapeutic drug level monitoring: Secondary | ICD-10-CM | POA: Diagnosis not present

## 2022-08-30 DIAGNOSIS — I480 Paroxysmal atrial fibrillation: Secondary | ICD-10-CM | POA: Diagnosis not present

## 2022-08-30 DIAGNOSIS — I10 Essential (primary) hypertension: Secondary | ICD-10-CM | POA: Diagnosis not present

## 2022-08-31 DIAGNOSIS — I493 Ventricular premature depolarization: Secondary | ICD-10-CM | POA: Diagnosis not present

## 2022-09-01 DIAGNOSIS — I493 Ventricular premature depolarization: Secondary | ICD-10-CM | POA: Diagnosis not present

## 2022-09-02 DIAGNOSIS — E559 Vitamin D deficiency, unspecified: Secondary | ICD-10-CM | POA: Diagnosis not present

## 2022-09-02 DIAGNOSIS — R69 Illness, unspecified: Secondary | ICD-10-CM | POA: Diagnosis not present

## 2022-09-02 DIAGNOSIS — E1165 Type 2 diabetes mellitus with hyperglycemia: Secondary | ICD-10-CM | POA: Diagnosis not present

## 2022-09-02 DIAGNOSIS — Z0001 Encounter for general adult medical examination with abnormal findings: Secondary | ICD-10-CM | POA: Diagnosis not present

## 2022-09-02 DIAGNOSIS — E782 Mixed hyperlipidemia: Secondary | ICD-10-CM | POA: Diagnosis not present

## 2022-09-02 DIAGNOSIS — I480 Paroxysmal atrial fibrillation: Secondary | ICD-10-CM | POA: Diagnosis not present

## 2022-09-02 DIAGNOSIS — I10 Essential (primary) hypertension: Secondary | ICD-10-CM | POA: Diagnosis not present

## 2022-09-02 DIAGNOSIS — R5383 Other fatigue: Secondary | ICD-10-CM | POA: Diagnosis not present

## 2022-09-06 DIAGNOSIS — I493 Ventricular premature depolarization: Secondary | ICD-10-CM | POA: Diagnosis not present

## 2022-09-08 DIAGNOSIS — I4949 Other premature depolarization: Secondary | ICD-10-CM | POA: Diagnosis not present

## 2022-09-12 ENCOUNTER — Emergency Department (HOSPITAL_BASED_OUTPATIENT_CLINIC_OR_DEPARTMENT_OTHER): Payer: Medicare HMO

## 2022-09-12 ENCOUNTER — Other Ambulatory Visit: Payer: Self-pay

## 2022-09-12 ENCOUNTER — Emergency Department (HOSPITAL_BASED_OUTPATIENT_CLINIC_OR_DEPARTMENT_OTHER)
Admission: EM | Admit: 2022-09-12 | Discharge: 2022-09-12 | Disposition: A | Discharge status: Home / Self Care | Payer: Medicare HMO | Attending: Emergency Medicine | Admitting: Emergency Medicine

## 2022-09-12 DIAGNOSIS — R531 Weakness: Secondary | ICD-10-CM | POA: Diagnosis not present

## 2022-09-12 DIAGNOSIS — Z7901 Long term (current) use of anticoagulants: Secondary | ICD-10-CM | POA: Insufficient documentation

## 2022-09-12 DIAGNOSIS — R5383 Other fatigue: Secondary | ICD-10-CM | POA: Diagnosis not present

## 2022-09-12 DIAGNOSIS — E860 Dehydration: Secondary | ICD-10-CM | POA: Diagnosis not present

## 2022-09-12 DIAGNOSIS — N289 Disorder of kidney and ureter, unspecified: Secondary | ICD-10-CM | POA: Diagnosis not present

## 2022-09-12 DIAGNOSIS — R110 Nausea: Secondary | ICD-10-CM | POA: Diagnosis not present

## 2022-09-12 DIAGNOSIS — Z1152 Encounter for screening for COVID-19: Secondary | ICD-10-CM | POA: Insufficient documentation

## 2022-09-12 DIAGNOSIS — N189 Chronic kidney disease, unspecified: Secondary | ICD-10-CM | POA: Diagnosis not present

## 2022-09-12 LAB — URINALYSIS, MICROSCOPIC (REFLEX): WBC, UA: NONE SEEN WBC/hpf (ref 0–5)

## 2022-09-12 LAB — BASIC METABOLIC PANEL
Anion gap: 9 (ref 5–15)
BUN: 31 mg/dL — ABNORMAL HIGH (ref 8–23)
CO2: 21 mmol/L — ABNORMAL LOW (ref 22–32)
Calcium: 9.8 mg/dL (ref 8.9–10.3)
Chloride: 105 mmol/L (ref 98–111)
Creatinine, Ser: 1.58 mg/dL — ABNORMAL HIGH (ref 0.44–1.00)
GFR, Estimated: 32 mL/min — ABNORMAL LOW (ref >60)
Glucose, Bld: 153 mg/dL — ABNORMAL HIGH (ref 70–99)
Potassium: 4.4 mmol/L (ref 3.5–5.1)
Sodium: 135 mmol/L (ref 135–145)

## 2022-09-12 LAB — CBG MONITORING, ED: Glucose-Capillary: 150 mg/dL — ABNORMAL HIGH (ref 70–99)

## 2022-09-12 LAB — URINALYSIS, ROUTINE W REFLEX MICROSCOPIC
Glucose, UA: NEGATIVE mg/dL
Hgb urine dipstick: NEGATIVE
Ketones, ur: 15 mg/dL — AB
Leukocytes,Ua: NEGATIVE
Nitrite: NEGATIVE
Protein, ur: 100 mg/dL — AB
Specific Gravity, Urine: >=1.03 (ref 1.005–1.030)
pH: 5 (ref 5.0–8.0)

## 2022-09-12 LAB — RESP PANEL BY RT-PCR (RSV, FLU A&B, COVID)  RVPGX2
Influenza A by PCR: NEGATIVE
Influenza B by PCR: NEGATIVE
Resp Syncytial Virus by PCR: NEGATIVE
SARS Coronavirus 2 by RT PCR: NEGATIVE

## 2022-09-12 LAB — CBC
HCT: 44 % (ref 36.0–46.0)
Hemoglobin: 15.2 g/dL — ABNORMAL HIGH (ref 12.0–15.0)
MCH: 30.5 pg (ref 26.0–34.0)
MCHC: 34.5 g/dL (ref 30.0–36.0)
MCV: 88.4 fL (ref 80.0–100.0)
Platelets: 291 10*3/uL (ref 150–400)
RBC: 4.98 MIL/uL (ref 3.87–5.11)
RDW: 12.9 % (ref 11.5–15.5)
WBC: 9 10*3/uL (ref 4.0–10.5)
nRBC: 0 % (ref 0.0–0.2)

## 2022-09-12 LAB — TROPONIN I (HIGH SENSITIVITY)
Troponin I (High Sensitivity): 7 ng/L (ref <18)
Troponin I (High Sensitivity): 6 ng/L (ref <18)
Troponin I (High Sensitivity): 5 ng/L (ref <18)

## 2022-09-12 MED ORDER — SODIUM CHLORIDE 0.9 % IV BOLUS: 500.0000 mL | Freq: Once | INTRAVENOUS | Status: DC

## 2022-09-12 MED ORDER — SODIUM CHLORIDE 0.9 % IV BOLUS
500.0000 mL | Freq: Once | INTRAVENOUS | Status: AC
  Administered 2022-09-12: 500 mL via INTRAVENOUS

## 2022-09-12 NOTE — ED Notes (Signed)
Dc instructions reviewed with pt no questions or concerns at this time. Pt will follow up with pcp next weekend to have labs redrawn.

## 2022-09-12 NOTE — ED Triage Notes (Signed)
Pt bib family with complaints of fatigue and chronic chest tightness. Pt states she ate a hotdog 30 mins ago. Pt states I just don't feel right. Nauseated, no vomiting.

## 2022-09-12 NOTE — Discharge Instructions (Signed)
Increase your oral fluid intake.  See your Physician for recheck and repeat labs in 1 week.

## 2022-09-12 NOTE — ED Triage Notes (Signed)
Pt states she was at the grocery store and felt like she was going to pass out and got very sweaty. Pt nauseated in triage. Alert and oriented.

## 2022-09-12 NOTE — ED Provider Notes (Signed)
[facility] Provider Note   CSN: [id number] Arrival date & time: 09/12/22  1323     History  Chief Complaint  Patient presents with   Fatigue   Chest Pain    Kathryn Thomas is a 81 y.o. female.  Pt complains of feeling weak today.  Pt reports she has been losing weight.  Pt reports she has a poor appetite.  Pt reports she has been having increasing weakness.  Pt denies fever or chills.  Pt states she had a heavy sensation in her chest today that lasted for a few seconds.  Pt reports her activity tolerance has been decreasing.  Pt is scheduled for an ablation in April.    The history is provided by the patient. No language interpreter was used.  Chest Pain Pain location:  Unable to specify Pain quality: aching   Progression:  Worsening Chronicity:  New Relieved by:  Nothing Ineffective treatments:  None tried Associated symptoms: dizziness   Associated symptoms: no abdominal pain, no anxiety and no cough   Risk factors: no smoking        Home Medications Prior to Admission medications   Medication Sig Start Date End Date Taking? Authorizing Provider  acetaminophen (TYLENOL) 325 MG tablet You can take 2 tablets every 4 hours as needed for pain.  Do not take more than 4000 mg of Tylenol per day.  It can harm your liver. You can buy this over the counter at any drug store. Patient taking differently: Take 325 mg by mouth every 6 (six) hours as needed for moderate pain. 11/29/19   Earnstine Regal, PA-C  atorvastatin (LIPITOR) 20 MG tablet Take 20 mg by mouth daily. 02/22/22   [provider]  ELIQUIS 5 MG TABS tablet Take 5 mg by mouth 2 (two) times daily. 03/20/22   [provider]  losartan (COZAAR) 100 MG tablet Take 1 tablet (100 mg total) by mouth daily. 04/23/22   Little Ishikawa, MD  metFORMIN (GLUCOPHAGE-XR) 500 MG 24 hr tablet Take 500 mg by mouth daily. 04/15/22   [provider]  metoprolol succinate  (TOPROL-XL) 25 MG 24 hr tablet Take 1 tablet (25 mg total) by mouth daily. 04/23/22   Little Ishikawa, MD  QUEtiapine (SEROQUEL) 50 MG tablet Take 50 mg by mouth 2 (two) times daily. 04/15/22   [provider]  Vitamin D, Ergocalciferol, (DRISDOL) 50000 UNITS CAPS capsule Take 50,000 Units by mouth every 7 (seven) days. On Wednesday.    [provider]      Allergies    Patient has no known allergies.    Review of Systems   Review of Systems  Respiratory:  Negative for cough.   Cardiovascular:  Positive for chest pain.  Gastrointestinal:  Negative for abdominal pain.  Neurological:  Positive for dizziness.  All other systems reviewed and are negative.   Physical Exam Updated Vital Signs BP (!) 81/57   Pulse 62   Temp 98 F (36.7 C) (Oral)   Resp 16   Ht '5\' 7"'$  (1.702 m)   Wt 97.5 kg   SpO2 95%   BMI 33.67 kg/m  Physical Exam Vitals and nursing note reviewed.  Constitutional:      General: She is not in acute distress.    Appearance: She is well-developed.  HENT:     Head: Normocephalic and atraumatic.  Eyes:     Conjunctiva/sclera: Conjunctivae normal.  Cardiovascular:     Rate and Rhythm: Normal  rate and regular rhythm.     Heart sounds: Normal heart sounds. No murmur heard. Pulmonary:     Effort: Pulmonary effort is normal. No respiratory distress.     Breath sounds: Normal breath sounds.  Abdominal:     General: Bowel sounds are normal.     Palpations: Abdomen is soft.     Tenderness: There is no abdominal tenderness.  Musculoskeletal:        General: No swelling.     Cervical back: Normal range of motion and neck supple.  Skin:    General: Skin is warm and dry.     Capillary Refill: Capillary refill takes less than 2 seconds.  Neurological:     Mental Status: She is alert.  Psychiatric:        Mood and Affect: Mood normal.     ED Results / Procedures / Treatments   Labs (all labs ordered are listed, but only abnormal results are  displayed) Labs Reviewed  BASIC METABOLIC PANEL - Abnormal; Notable for the following components:      Result Value   CO2 21 (*)    Glucose, Bld 153 (*)    BUN 31 (*)    Creatinine, Ser 1.58 (*)    GFR, Estimated 32 (*)    All other components within normal limits  CBC - Abnormal; Notable for the following components:   Hemoglobin 15.2 (*)    All other components within normal limits  URINALYSIS, ROUTINE W REFLEX MICROSCOPIC - Abnormal; Notable for the following components:   APPearance CLOUDY (*)    Bilirubin Urine SMALL (*)    Ketones, ur 15 (*)    Protein, ur 100 (*)    All other components within normal limits  URINALYSIS, MICROSCOPIC (REFLEX) - Abnormal; Notable for the following components:   Bacteria, UA RARE (*)    All other components within normal limits  CBG MONITORING, ED - Abnormal; Notable for the following components:   Glucose-Capillary 150 (*)    All other components within normal limits  RESP PANEL BY RT-PCR (RSV, FLU A&B, COVID)  [id number]  TROPONIN I (HIGH SENSITIVITY)  TROPONIN I (HIGH SENSITIVITY)  TROPONIN I (HIGH SENSITIVITY)  TROPONIN I (HIGH SENSITIVITY)    EKG EKG Interpretation  Date/Time:  Thursday September 12 2022 13:36:41 EST Ventricular Rate:  69 PR Interval:  192 QRS Duration: 94 QT Interval:  456 QTC Calculation: 488 R Axis:   23 Text Interpretation: Sinus rhythm with frequent Premature ventricular complexes Confirmed by Lajean Saver [phone]) on 09/12/2022 1:53:27 PM  Radiology DG Chest 2 View  Result Date: 09/12/2022 CLINICAL DATA:  Pt bib family with complaints of fatigue and chronic chest tightness. Pt states she ate a hotdog 30 mins ago. Pt states I just don't feel right. Nauseated, no vomiting EXAM: CHEST - 2 VIEW COMPARISON:  04/21/2022 FINDINGS: Lungs are clear. Heart size and mediastinal contours are within normal limits. Aortic Atherosclerosis (ICD10-170.0). No effusion. Anterior vertebral endplate spurring at multiple levels in  the lower thoracic spine. IMPRESSION: No acute cardiopulmonary disease. Electronically Signed   By: Lucrezia Europe M.D.   On: 09/12/2022 14:19    Procedures Procedures    Medications Ordered in ED Medications - No data to display  ED Course/ Medical Decision Making/ A&P                           Medical Decision Making Pt complains of increasing weakness for the past 4  months.  Pt reports today she has become tired very easy.   Amount and/or Complexity of Data Reviewed Labs: ordered. Decision-making details documented in ED Course.    Details: Labs ordered reviewed and interpreted.  Troponin is negative,  Bun and creatine are elevated to 31 and 1.58  Radiology: ordered and independent interpretation performed. Decision-making details documented in ED Course.    Details: Chest xray  no acute abnormality   Risk Risk Details: Pt given Iv fluids x 1 liter.  Pt's blood pressure 90/50.  Pt has a history of elevated blood pressure in the past.             Final Clinical Impression(s) / ED Diagnoses Final diagnoses:  Dehydration  Weakness  Renal disease    Rx / DC Orders ED Discharge Orders     None      An After Visit Summary was printed and given to the patient.    Fransico Meadow, [place] 09/12/22 1854    Charlesetta Shanks, MD 09/12/22 [phone]

## 2022-09-26 DIAGNOSIS — I4891 Unspecified atrial fibrillation: Secondary | ICD-10-CM | POA: Diagnosis not present

## 2022-09-26 DIAGNOSIS — Z7984 Long term (current) use of oral hypoglycemic drugs: Secondary | ICD-10-CM | POA: Diagnosis not present

## 2022-09-26 DIAGNOSIS — Z8249 Family history of ischemic heart disease and other diseases of the circulatory system: Secondary | ICD-10-CM | POA: Diagnosis not present

## 2022-09-26 DIAGNOSIS — I429 Cardiomyopathy, unspecified: Secondary | ICD-10-CM | POA: Diagnosis not present

## 2022-09-26 DIAGNOSIS — Z6835 Body mass index (BMI) 35.0-35.9, adult: Secondary | ICD-10-CM | POA: Diagnosis not present

## 2022-09-26 DIAGNOSIS — I10 Essential (primary) hypertension: Secondary | ICD-10-CM | POA: Diagnosis not present

## 2022-09-26 DIAGNOSIS — E1151 Type 2 diabetes mellitus with diabetic peripheral angiopathy without gangrene: Secondary | ICD-10-CM | POA: Diagnosis not present

## 2022-09-26 DIAGNOSIS — Z008 Encounter for other general examination: Secondary | ICD-10-CM | POA: Diagnosis not present

## 2022-09-26 DIAGNOSIS — E785 Hyperlipidemia, unspecified: Secondary | ICD-10-CM | POA: Diagnosis not present

## 2022-09-26 DIAGNOSIS — Z7901 Long term (current) use of anticoagulants: Secondary | ICD-10-CM | POA: Diagnosis not present

## 2022-09-26 DIAGNOSIS — D6869 Other thrombophilia: Secondary | ICD-10-CM | POA: Diagnosis not present

## 2022-10-28 DIAGNOSIS — R69 Illness, unspecified: Secondary | ICD-10-CM | POA: Diagnosis not present

## 2022-10-28 DIAGNOSIS — E1165 Type 2 diabetes mellitus with hyperglycemia: Secondary | ICD-10-CM | POA: Diagnosis not present

## 2022-10-28 DIAGNOSIS — E538 Deficiency of other specified B group vitamins: Secondary | ICD-10-CM | POA: Diagnosis not present

## 2022-10-28 DIAGNOSIS — I10 Essential (primary) hypertension: Secondary | ICD-10-CM | POA: Diagnosis not present

## 2022-10-28 DIAGNOSIS — E559 Vitamin D deficiency, unspecified: Secondary | ICD-10-CM | POA: Diagnosis not present

## 2022-10-28 DIAGNOSIS — E782 Mixed hyperlipidemia: Secondary | ICD-10-CM | POA: Diagnosis not present

## 2022-10-28 DIAGNOSIS — N179 Acute kidney failure, unspecified: Secondary | ICD-10-CM | POA: Diagnosis not present

## 2022-10-30 DIAGNOSIS — I4892 Unspecified atrial flutter: Secondary | ICD-10-CM | POA: Diagnosis not present

## 2022-10-30 DIAGNOSIS — Z7901 Long term (current) use of anticoagulants: Secondary | ICD-10-CM | POA: Diagnosis not present

## 2022-10-30 DIAGNOSIS — E119 Type 2 diabetes mellitus without complications: Secondary | ICD-10-CM | POA: Diagnosis not present

## 2022-10-30 DIAGNOSIS — E785 Hyperlipidemia, unspecified: Secondary | ICD-10-CM | POA: Diagnosis not present

## 2022-10-30 DIAGNOSIS — I493 Ventricular premature depolarization: Secondary | ICD-10-CM | POA: Diagnosis not present

## 2022-10-30 DIAGNOSIS — I872 Venous insufficiency (chronic) (peripheral): Secondary | ICD-10-CM | POA: Diagnosis not present

## 2022-10-30 DIAGNOSIS — I5020 Unspecified systolic (congestive) heart failure: Secondary | ICD-10-CM | POA: Diagnosis not present

## 2022-10-30 DIAGNOSIS — I480 Paroxysmal atrial fibrillation: Secondary | ICD-10-CM | POA: Diagnosis not present

## 2022-10-30 DIAGNOSIS — I110 Hypertensive heart disease with heart failure: Secondary | ICD-10-CM | POA: Diagnosis not present

## 2022-10-31 DIAGNOSIS — E538 Deficiency of other specified B group vitamins: Secondary | ICD-10-CM | POA: Diagnosis not present

## 2022-10-31 DIAGNOSIS — E559 Vitamin D deficiency, unspecified: Secondary | ICD-10-CM | POA: Diagnosis not present

## 2022-10-31 DIAGNOSIS — I10 Essential (primary) hypertension: Secondary | ICD-10-CM | POA: Diagnosis not present

## 2022-10-31 DIAGNOSIS — E1165 Type 2 diabetes mellitus with hyperglycemia: Secondary | ICD-10-CM | POA: Diagnosis not present

## 2022-10-31 DIAGNOSIS — R69 Illness, unspecified: Secondary | ICD-10-CM | POA: Diagnosis not present

## 2022-10-31 DIAGNOSIS — E782 Mixed hyperlipidemia: Secondary | ICD-10-CM | POA: Diagnosis not present

## 2022-10-31 DIAGNOSIS — N179 Acute kidney failure, unspecified: Secondary | ICD-10-CM | POA: Diagnosis not present

## 2022-11-28 DIAGNOSIS — I10 Essential (primary) hypertension: Secondary | ICD-10-CM | POA: Diagnosis not present

## 2022-11-28 DIAGNOSIS — E782 Mixed hyperlipidemia: Secondary | ICD-10-CM | POA: Diagnosis not present

## 2022-11-28 DIAGNOSIS — E119 Type 2 diabetes mellitus without complications: Secondary | ICD-10-CM | POA: Diagnosis not present

## 2022-11-28 DIAGNOSIS — E538 Deficiency of other specified B group vitamins: Secondary | ICD-10-CM | POA: Diagnosis not present

## 2022-11-28 DIAGNOSIS — E1165 Type 2 diabetes mellitus with hyperglycemia: Secondary | ICD-10-CM | POA: Diagnosis not present

## 2022-11-28 DIAGNOSIS — F419 Anxiety disorder, unspecified: Secondary | ICD-10-CM | POA: Diagnosis not present

## 2022-11-28 DIAGNOSIS — E559 Vitamin D deficiency, unspecified: Secondary | ICD-10-CM | POA: Diagnosis not present

## 2022-12-02 DIAGNOSIS — I480 Paroxysmal atrial fibrillation: Secondary | ICD-10-CM | POA: Diagnosis not present

## 2022-12-02 DIAGNOSIS — I493 Ventricular premature depolarization: Secondary | ICD-10-CM | POA: Diagnosis not present

## 2022-12-02 DIAGNOSIS — E1122 Type 2 diabetes mellitus with diabetic chronic kidney disease: Secondary | ICD-10-CM | POA: Diagnosis not present

## 2022-12-02 DIAGNOSIS — I129 Hypertensive chronic kidney disease with stage 1 through stage 4 chronic kidney disease, or unspecified chronic kidney disease: Secondary | ICD-10-CM | POA: Diagnosis not present

## 2022-12-02 DIAGNOSIS — N183 Chronic kidney disease, stage 3 unspecified: Secondary | ICD-10-CM | POA: Diagnosis not present

## 2022-12-02 DIAGNOSIS — E785 Hyperlipidemia, unspecified: Secondary | ICD-10-CM | POA: Diagnosis not present

## 2022-12-02 DIAGNOSIS — Z7984 Long term (current) use of oral hypoglycemic drugs: Secondary | ICD-10-CM | POA: Diagnosis not present

## 2022-12-02 DIAGNOSIS — Z7901 Long term (current) use of anticoagulants: Secondary | ICD-10-CM | POA: Diagnosis not present

## 2022-12-02 DIAGNOSIS — M48062 Spinal stenosis, lumbar region with neurogenic claudication: Secondary | ICD-10-CM | POA: Diagnosis not present

## 2022-12-03 DIAGNOSIS — I129 Hypertensive chronic kidney disease with stage 1 through stage 4 chronic kidney disease, or unspecified chronic kidney disease: Secondary | ICD-10-CM | POA: Diagnosis not present

## 2022-12-03 DIAGNOSIS — M48062 Spinal stenosis, lumbar region with neurogenic claudication: Secondary | ICD-10-CM | POA: Diagnosis not present

## 2022-12-03 DIAGNOSIS — E1122 Type 2 diabetes mellitus with diabetic chronic kidney disease: Secondary | ICD-10-CM | POA: Diagnosis not present

## 2022-12-03 DIAGNOSIS — Z7901 Long term (current) use of anticoagulants: Secondary | ICD-10-CM | POA: Diagnosis not present

## 2022-12-03 DIAGNOSIS — G8918 Other acute postprocedural pain: Secondary | ICD-10-CM | POA: Diagnosis not present

## 2022-12-03 DIAGNOSIS — I499 Cardiac arrhythmia, unspecified: Secondary | ICD-10-CM | POA: Diagnosis not present

## 2022-12-03 DIAGNOSIS — I480 Paroxysmal atrial fibrillation: Secondary | ICD-10-CM | POA: Diagnosis not present

## 2022-12-03 DIAGNOSIS — Z7984 Long term (current) use of oral hypoglycemic drugs: Secondary | ICD-10-CM | POA: Diagnosis not present

## 2022-12-03 DIAGNOSIS — E785 Hyperlipidemia, unspecified: Secondary | ICD-10-CM | POA: Diagnosis not present

## 2022-12-03 DIAGNOSIS — I493 Ventricular premature depolarization: Secondary | ICD-10-CM | POA: Diagnosis not present

## 2022-12-03 DIAGNOSIS — N183 Chronic kidney disease, stage 3 unspecified: Secondary | ICD-10-CM | POA: Diagnosis not present

## 2022-12-04 DIAGNOSIS — E785 Hyperlipidemia, unspecified: Secondary | ICD-10-CM | POA: Diagnosis not present

## 2022-12-04 DIAGNOSIS — Z9889 Other specified postprocedural states: Secondary | ICD-10-CM | POA: Diagnosis not present

## 2022-12-04 DIAGNOSIS — I480 Paroxysmal atrial fibrillation: Secondary | ICD-10-CM | POA: Diagnosis not present

## 2022-12-04 DIAGNOSIS — Z7984 Long term (current) use of oral hypoglycemic drugs: Secondary | ICD-10-CM | POA: Diagnosis not present

## 2022-12-04 DIAGNOSIS — N183 Chronic kidney disease, stage 3 unspecified: Secondary | ICD-10-CM | POA: Diagnosis not present

## 2022-12-04 DIAGNOSIS — Z7901 Long term (current) use of anticoagulants: Secondary | ICD-10-CM | POA: Diagnosis not present

## 2022-12-04 DIAGNOSIS — I10 Essential (primary) hypertension: Secondary | ICD-10-CM | POA: Diagnosis not present

## 2022-12-04 DIAGNOSIS — I493 Ventricular premature depolarization: Secondary | ICD-10-CM | POA: Diagnosis not present

## 2022-12-04 DIAGNOSIS — E1122 Type 2 diabetes mellitus with diabetic chronic kidney disease: Secondary | ICD-10-CM | POA: Diagnosis not present

## 2022-12-04 DIAGNOSIS — M48062 Spinal stenosis, lumbar region with neurogenic claudication: Secondary | ICD-10-CM | POA: Diagnosis not present

## 2022-12-04 DIAGNOSIS — I129 Hypertensive chronic kidney disease with stage 1 through stage 4 chronic kidney disease, or unspecified chronic kidney disease: Secondary | ICD-10-CM | POA: Diagnosis not present

## 2022-12-04 DIAGNOSIS — E119 Type 2 diabetes mellitus without complications: Secondary | ICD-10-CM | POA: Diagnosis not present

## 2022-12-06 DIAGNOSIS — M5441 Lumbago with sciatica, right side: Secondary | ICD-10-CM | POA: Diagnosis not present

## 2022-12-06 DIAGNOSIS — R319 Hematuria, unspecified: Secondary | ICD-10-CM | POA: Diagnosis not present

## 2022-12-06 DIAGNOSIS — L988 Other specified disorders of the skin and subcutaneous tissue: Secondary | ICD-10-CM | POA: Diagnosis not present

## 2022-12-16 DIAGNOSIS — I10 Essential (primary) hypertension: Secondary | ICD-10-CM | POA: Diagnosis not present

## 2022-12-16 DIAGNOSIS — M48062 Spinal stenosis, lumbar region with neurogenic claudication: Secondary | ICD-10-CM | POA: Diagnosis not present

## 2022-12-16 DIAGNOSIS — M5106 Intervertebral disc disorders with myelopathy, lumbar region: Secondary | ICD-10-CM | POA: Diagnosis not present

## 2022-12-16 DIAGNOSIS — Z6837 Body mass index (BMI) 37.0-37.9, adult: Secondary | ICD-10-CM | POA: Diagnosis not present

## 2022-12-18 ENCOUNTER — Other Ambulatory Visit: Payer: Self-pay | Admitting: Physician Assistant

## 2022-12-18 DIAGNOSIS — M5106 Intervertebral disc disorders with myelopathy, lumbar region: Secondary | ICD-10-CM

## 2022-12-18 DIAGNOSIS — M5416 Radiculopathy, lumbar region: Secondary | ICD-10-CM

## 2022-12-18 DIAGNOSIS — M532X6 Spinal instabilities, lumbar region: Secondary | ICD-10-CM

## 2022-12-18 DIAGNOSIS — M4716 Other spondylosis with myelopathy, lumbar region: Secondary | ICD-10-CM

## 2022-12-18 DIAGNOSIS — M48062 Spinal stenosis, lumbar region with neurogenic claudication: Secondary | ICD-10-CM

## 2022-12-25 DIAGNOSIS — Z0389 Encounter for observation for other suspected diseases and conditions ruled out: Secondary | ICD-10-CM | POA: Diagnosis not present

## 2022-12-25 DIAGNOSIS — Z743 Need for continuous supervision: Secondary | ICD-10-CM | POA: Diagnosis not present

## 2022-12-25 DIAGNOSIS — M5124 Other intervertebral disc displacement, thoracic region: Secondary | ICD-10-CM | POA: Diagnosis not present

## 2022-12-25 DIAGNOSIS — Z7984 Long term (current) use of oral hypoglycemic drugs: Secondary | ICD-10-CM | POA: Diagnosis not present

## 2022-12-25 DIAGNOSIS — M48061 Spinal stenosis, lumbar region without neurogenic claudication: Secondary | ICD-10-CM | POA: Diagnosis not present

## 2022-12-25 DIAGNOSIS — N183 Chronic kidney disease, stage 3 unspecified: Secondary | ICD-10-CM | POA: Diagnosis not present

## 2022-12-25 DIAGNOSIS — M48062 Spinal stenosis, lumbar region with neurogenic claudication: Secondary | ICD-10-CM | POA: Diagnosis not present

## 2022-12-25 DIAGNOSIS — T380X5A Adverse effect of glucocorticoids and synthetic analogues, initial encounter: Secondary | ICD-10-CM | POA: Diagnosis not present

## 2022-12-25 DIAGNOSIS — Z9842 Cataract extraction status, left eye: Secondary | ICD-10-CM | POA: Diagnosis not present

## 2022-12-25 DIAGNOSIS — E785 Hyperlipidemia, unspecified: Secondary | ICD-10-CM | POA: Diagnosis not present

## 2022-12-25 DIAGNOSIS — E1122 Type 2 diabetes mellitus with diabetic chronic kidney disease: Secondary | ICD-10-CM | POA: Diagnosis not present

## 2022-12-25 DIAGNOSIS — Z9049 Acquired absence of other specified parts of digestive tract: Secondary | ICD-10-CM | POA: Diagnosis not present

## 2022-12-25 DIAGNOSIS — G9520 Unspecified cord compression: Secondary | ICD-10-CM | POA: Diagnosis not present

## 2022-12-25 DIAGNOSIS — E875 Hyperkalemia: Secondary | ICD-10-CM | POA: Diagnosis not present

## 2022-12-25 DIAGNOSIS — R159 Full incontinence of feces: Secondary | ICD-10-CM | POA: Diagnosis not present

## 2022-12-25 DIAGNOSIS — I480 Paroxysmal atrial fibrillation: Secondary | ICD-10-CM | POA: Diagnosis not present

## 2022-12-25 DIAGNOSIS — Z9841 Cataract extraction status, right eye: Secondary | ICD-10-CM | POA: Diagnosis not present

## 2022-12-25 DIAGNOSIS — L89326 Pressure-induced deep tissue damage of left buttock: Secondary | ICD-10-CM | POA: Diagnosis not present

## 2022-12-25 DIAGNOSIS — M549 Dorsalgia, unspecified: Secondary | ICD-10-CM | POA: Diagnosis not present

## 2022-12-25 DIAGNOSIS — L89316 Pressure-induced deep tissue damage of right buttock: Secondary | ICD-10-CM | POA: Diagnosis not present

## 2022-12-25 DIAGNOSIS — N179 Acute kidney failure, unspecified: Secondary | ICD-10-CM | POA: Diagnosis not present

## 2022-12-25 DIAGNOSIS — M5126 Other intervertebral disc displacement, lumbar region: Secondary | ICD-10-CM | POA: Diagnosis not present

## 2022-12-25 DIAGNOSIS — M5106 Intervertebral disc disorders with myelopathy, lumbar region: Secondary | ICD-10-CM | POA: Diagnosis not present

## 2022-12-25 DIAGNOSIS — M6281 Muscle weakness (generalized): Secondary | ICD-10-CM | POA: Diagnosis not present

## 2022-12-25 DIAGNOSIS — Z7901 Long term (current) use of anticoagulants: Secondary | ICD-10-CM | POA: Diagnosis not present

## 2022-12-25 DIAGNOSIS — R29898 Other symptoms and signs involving the musculoskeletal system: Secondary | ICD-10-CM | POA: Diagnosis not present

## 2022-12-25 DIAGNOSIS — Z87891 Personal history of nicotine dependence: Secondary | ICD-10-CM | POA: Diagnosis not present

## 2022-12-25 DIAGNOSIS — I129 Hypertensive chronic kidney disease with stage 1 through stage 4 chronic kidney disease, or unspecified chronic kidney disease: Secondary | ICD-10-CM | POA: Diagnosis not present

## 2022-12-25 DIAGNOSIS — Z7409 Other reduced mobility: Secondary | ICD-10-CM | POA: Diagnosis not present

## 2022-12-25 DIAGNOSIS — M5416 Radiculopathy, lumbar region: Secondary | ICD-10-CM | POA: Diagnosis not present

## 2022-12-25 DIAGNOSIS — G9529 Other cord compression: Secondary | ICD-10-CM | POA: Diagnosis not present

## 2022-12-25 DIAGNOSIS — R3981 Functional urinary incontinence: Secondary | ICD-10-CM | POA: Diagnosis not present

## 2022-12-25 DIAGNOSIS — M4805 Spinal stenosis, thoracolumbar region: Secondary | ICD-10-CM | POA: Diagnosis not present

## 2022-12-25 DIAGNOSIS — M5105 Intervertebral disc disorders with myelopathy, thoracolumbar region: Secondary | ICD-10-CM | POA: Diagnosis not present

## 2022-12-25 DIAGNOSIS — Z961 Presence of intraocular lens: Secondary | ICD-10-CM | POA: Diagnosis not present

## 2022-12-25 DIAGNOSIS — G834 Cauda equina syndrome: Secondary | ICD-10-CM | POA: Diagnosis not present

## 2022-12-26 DIAGNOSIS — G9529 Other cord compression: Secondary | ICD-10-CM | POA: Diagnosis not present

## 2022-12-26 DIAGNOSIS — M48061 Spinal stenosis, lumbar region without neurogenic claudication: Secondary | ICD-10-CM | POA: Diagnosis not present

## 2022-12-26 DIAGNOSIS — G9520 Unspecified cord compression: Secondary | ICD-10-CM | POA: Diagnosis not present

## 2022-12-27 DIAGNOSIS — G834 Cauda equina syndrome: Secondary | ICD-10-CM | POA: Diagnosis not present

## 2022-12-27 DIAGNOSIS — M5106 Intervertebral disc disorders with myelopathy, lumbar region: Secondary | ICD-10-CM | POA: Diagnosis not present

## 2022-12-27 DIAGNOSIS — M48062 Spinal stenosis, lumbar region with neurogenic claudication: Secondary | ICD-10-CM | POA: Diagnosis not present

## 2022-12-27 DIAGNOSIS — R29898 Other symptoms and signs involving the musculoskeletal system: Secondary | ICD-10-CM | POA: Diagnosis not present

## 2022-12-28 DIAGNOSIS — R29898 Other symptoms and signs involving the musculoskeletal system: Secondary | ICD-10-CM | POA: Diagnosis not present

## 2022-12-29 DIAGNOSIS — R29898 Other symptoms and signs involving the musculoskeletal system: Secondary | ICD-10-CM | POA: Diagnosis not present

## 2022-12-30 DIAGNOSIS — M48062 Spinal stenosis, lumbar region with neurogenic claudication: Secondary | ICD-10-CM | POA: Diagnosis not present

## 2022-12-30 DIAGNOSIS — R29898 Other symptoms and signs involving the musculoskeletal system: Secondary | ICD-10-CM | POA: Diagnosis not present

## 2022-12-30 DIAGNOSIS — M5106 Intervertebral disc disorders with myelopathy, lumbar region: Secondary | ICD-10-CM | POA: Diagnosis not present

## 2022-12-30 DIAGNOSIS — G834 Cauda equina syndrome: Secondary | ICD-10-CM | POA: Diagnosis not present

## 2022-12-31 DIAGNOSIS — R29898 Other symptoms and signs involving the musculoskeletal system: Secondary | ICD-10-CM | POA: Diagnosis not present

## 2023-01-01 DIAGNOSIS — R29898 Other symptoms and signs involving the musculoskeletal system: Secondary | ICD-10-CM | POA: Diagnosis not present

## 2023-01-02 DIAGNOSIS — R29898 Other symptoms and signs involving the musculoskeletal system: Secondary | ICD-10-CM | POA: Diagnosis not present

## 2023-01-03 DIAGNOSIS — R29898 Other symptoms and signs involving the musculoskeletal system: Secondary | ICD-10-CM | POA: Diagnosis not present

## 2023-01-04 DIAGNOSIS — R29898 Other symptoms and signs involving the musculoskeletal system: Secondary | ICD-10-CM | POA: Diagnosis not present

## 2023-01-05 DIAGNOSIS — R29898 Other symptoms and signs involving the musculoskeletal system: Secondary | ICD-10-CM | POA: Diagnosis not present

## 2023-01-06 DIAGNOSIS — F331 Major depressive disorder, recurrent, moderate: Secondary | ICD-10-CM | POA: Diagnosis not present

## 2023-01-06 DIAGNOSIS — Z789 Other specified health status: Secondary | ICD-10-CM | POA: Diagnosis not present

## 2023-01-06 DIAGNOSIS — E1140 Type 2 diabetes mellitus with diabetic neuropathy, unspecified: Secondary | ICD-10-CM | POA: Diagnosis not present

## 2023-01-06 DIAGNOSIS — Z743 Need for continuous supervision: Secondary | ICD-10-CM | POA: Diagnosis not present

## 2023-01-06 DIAGNOSIS — I480 Paroxysmal atrial fibrillation: Secondary | ICD-10-CM | POA: Diagnosis not present

## 2023-01-06 DIAGNOSIS — Z9889 Other specified postprocedural states: Secondary | ICD-10-CM | POA: Diagnosis not present

## 2023-01-06 DIAGNOSIS — E785 Hyperlipidemia, unspecified: Secondary | ICD-10-CM | POA: Diagnosis not present

## 2023-01-06 DIAGNOSIS — Z7409 Other reduced mobility: Secondary | ICD-10-CM | POA: Diagnosis not present

## 2023-01-06 DIAGNOSIS — I129 Hypertensive chronic kidney disease with stage 1 through stage 4 chronic kidney disease, or unspecified chronic kidney disease: Secondary | ICD-10-CM | POA: Diagnosis not present

## 2023-01-06 DIAGNOSIS — K5900 Constipation, unspecified: Secondary | ICD-10-CM | POA: Diagnosis not present

## 2023-01-06 DIAGNOSIS — D649 Anemia, unspecified: Secondary | ICD-10-CM | POA: Diagnosis not present

## 2023-01-06 DIAGNOSIS — M549 Dorsalgia, unspecified: Secondary | ICD-10-CM | POA: Diagnosis not present

## 2023-01-06 DIAGNOSIS — M48062 Spinal stenosis, lumbar region with neurogenic claudication: Secondary | ICD-10-CM | POA: Diagnosis not present

## 2023-01-06 DIAGNOSIS — R69 Illness, unspecified: Secondary | ICD-10-CM | POA: Diagnosis not present

## 2023-01-06 DIAGNOSIS — I482 Chronic atrial fibrillation, unspecified: Secondary | ICD-10-CM | POA: Diagnosis not present

## 2023-01-06 DIAGNOSIS — F332 Major depressive disorder, recurrent severe without psychotic features: Secondary | ICD-10-CM | POA: Diagnosis not present

## 2023-01-06 DIAGNOSIS — E1122 Type 2 diabetes mellitus with diabetic chronic kidney disease: Secondary | ICD-10-CM | POA: Diagnosis not present

## 2023-01-06 DIAGNOSIS — E875 Hyperkalemia: Secondary | ICD-10-CM | POA: Diagnosis not present

## 2023-01-06 DIAGNOSIS — Z981 Arthrodesis status: Secondary | ICD-10-CM | POA: Diagnosis not present

## 2023-01-06 DIAGNOSIS — R29898 Other symptoms and signs involving the musculoskeletal system: Secondary | ICD-10-CM | POA: Diagnosis not present

## 2023-01-06 DIAGNOSIS — N183 Chronic kidney disease, stage 3 unspecified: Secondary | ICD-10-CM | POA: Diagnosis not present

## 2023-01-06 DIAGNOSIS — Z4789 Encounter for other orthopedic aftercare: Secondary | ICD-10-CM | POA: Diagnosis not present

## 2023-01-06 DIAGNOSIS — Z8679 Personal history of other diseases of the circulatory system: Secondary | ICD-10-CM | POA: Diagnosis not present

## 2023-01-06 DIAGNOSIS — Z7901 Long term (current) use of anticoagulants: Secondary | ICD-10-CM | POA: Diagnosis not present

## 2023-01-06 DIAGNOSIS — D72829 Elevated white blood cell count, unspecified: Secondary | ICD-10-CM | POA: Diagnosis not present

## 2023-01-06 DIAGNOSIS — I10 Essential (primary) hypertension: Secondary | ICD-10-CM | POA: Diagnosis not present

## 2023-01-07 DIAGNOSIS — Z7409 Other reduced mobility: Secondary | ICD-10-CM | POA: Diagnosis not present

## 2023-01-07 DIAGNOSIS — I482 Chronic atrial fibrillation, unspecified: Secondary | ICD-10-CM | POA: Diagnosis not present

## 2023-01-07 DIAGNOSIS — I10 Essential (primary) hypertension: Secondary | ICD-10-CM | POA: Diagnosis not present

## 2023-01-07 DIAGNOSIS — E1140 Type 2 diabetes mellitus with diabetic neuropathy, unspecified: Secondary | ICD-10-CM | POA: Diagnosis not present

## 2023-01-07 DIAGNOSIS — M48062 Spinal stenosis, lumbar region with neurogenic claudication: Secondary | ICD-10-CM | POA: Diagnosis not present

## 2023-01-07 DIAGNOSIS — R69 Illness, unspecified: Secondary | ICD-10-CM | POA: Diagnosis not present

## 2023-01-07 DIAGNOSIS — E785 Hyperlipidemia, unspecified: Secondary | ICD-10-CM | POA: Diagnosis not present

## 2023-01-07 DIAGNOSIS — Z789 Other specified health status: Secondary | ICD-10-CM | POA: Diagnosis not present

## 2023-01-28 DIAGNOSIS — E1122 Type 2 diabetes mellitus with diabetic chronic kidney disease: Secondary | ICD-10-CM | POA: Diagnosis not present

## 2023-01-28 DIAGNOSIS — I480 Paroxysmal atrial fibrillation: Secondary | ICD-10-CM | POA: Diagnosis not present

## 2023-01-28 DIAGNOSIS — M48062 Spinal stenosis, lumbar region with neurogenic claudication: Secondary | ICD-10-CM | POA: Diagnosis not present

## 2023-01-28 DIAGNOSIS — Z4789 Encounter for other orthopedic aftercare: Secondary | ICD-10-CM | POA: Diagnosis not present

## 2023-01-29 DIAGNOSIS — M48062 Spinal stenosis, lumbar region with neurogenic claudication: Secondary | ICD-10-CM | POA: Diagnosis not present

## 2023-01-29 DIAGNOSIS — Z4789 Encounter for other orthopedic aftercare: Secondary | ICD-10-CM | POA: Diagnosis not present

## 2023-01-29 DIAGNOSIS — I480 Paroxysmal atrial fibrillation: Secondary | ICD-10-CM | POA: Diagnosis not present

## 2023-01-29 DIAGNOSIS — E1122 Type 2 diabetes mellitus with diabetic chronic kidney disease: Secondary | ICD-10-CM | POA: Diagnosis not present

## 2023-02-03 ENCOUNTER — Emergency Department (HOSPITAL_BASED_OUTPATIENT_CLINIC_OR_DEPARTMENT_OTHER)
Admission: EM | Admit: 2023-02-03 | Discharge: 2023-02-03 | Disposition: A | Discharge status: Home / Self Care | Payer: Medicare HMO | Attending: Emergency Medicine | Admitting: Emergency Medicine

## 2023-02-03 ENCOUNTER — Encounter (HOSPITAL_BASED_OUTPATIENT_CLINIC_OR_DEPARTMENT_OTHER): Payer: Self-pay | Admitting: Urology

## 2023-02-03 ENCOUNTER — Other Ambulatory Visit: Payer: Self-pay

## 2023-02-03 DIAGNOSIS — Z7901 Long term (current) use of anticoagulants: Secondary | ICD-10-CM | POA: Insufficient documentation

## 2023-02-03 DIAGNOSIS — R319 Hematuria, unspecified: Secondary | ICD-10-CM | POA: Diagnosis not present

## 2023-02-03 DIAGNOSIS — R310 Gross hematuria: Secondary | ICD-10-CM | POA: Diagnosis not present

## 2023-02-03 LAB — URINALYSIS, MICROSCOPIC (REFLEX)

## 2023-02-03 LAB — URINALYSIS, ROUTINE W REFLEX MICROSCOPIC
Bilirubin Urine: NEGATIVE
Glucose, UA: NEGATIVE mg/dL
Ketones, ur: NEGATIVE mg/dL
Nitrite: NEGATIVE
Protein, ur: 30 mg/dL — AB
Specific Gravity, Urine: 1.02 (ref 1.005–1.030)
pH: 5.5 (ref 5.0–8.0)

## 2023-02-03 NOTE — Discharge Instructions (Signed)
Your urine does not obviously look infected today.  Please follow-up with the urologist in the office.  Please return for abdominal pain pain in your side and fever.  Please return for inability to urinate.

## 2023-02-03 NOTE — ED Triage Notes (Signed)
Returned home on 5/6 from [facility], noticed blood in urine that started on 5/8, nausea episode 3 days ago but none today Denies any pain    3/11 had heart ablasion  4/8 had back sx

## 2023-02-03 NOTE — ED Provider Notes (Signed)
[facility] Provider Note   CSN: [id number] Arrival date & time: 02/03/23  1104     History  Chief Complaint  Patient presents with   Hematuria    Kathryn Thomas is a 82 y.o. female.  82 yo F with a chief complaint of blood in her urine.  Has been off and on for almost a week.  She denies any discomfort denies dysuria increased frequency or hesitancy denies inability urinate.  She denies that this is rectal bleeding.  Had been seen in urgent care previously and started on antibiotics.  Has not had improvement with antibiotics.   Hematuria       Home Medications Prior to Admission medications   Medication Sig Start Date End Date Taking? Authorizing Provider  acetaminophen (TYLENOL) 325 MG tablet You can take 2 tablets every 4 hours as needed for pain.  Do not take more than 4000 mg of Tylenol per day.  It can harm your liver. You can buy this over the counter at any drug store. Patient taking differently: Take 325 mg by mouth every 6 (six) hours as needed for moderate pain. 11/29/19   Sherrie George, PA-C  atorvastatin (LIPITOR) 20 MG tablet Take 20 mg by mouth daily. 02/22/22   [provider]  ELIQUIS 5 MG TABS tablet Take 5 mg by mouth 2 (two) times daily. 03/20/22   [provider]  losartan (COZAAR) 100 MG tablet Take 1 tablet (100 mg total) by mouth daily. 04/23/22   Azucena Fallen, MD  metFORMIN (GLUCOPHAGE-XR) 500 MG 24 hr tablet Take 500 mg by mouth daily. 04/15/22   [provider]  metoprolol succinate (TOPROL-XL) 25 MG 24 hr tablet Take 1 tablet (25 mg total) by mouth daily. 04/23/22   Azucena Fallen, MD  QUEtiapine (SEROQUEL) 50 MG tablet Take 50 mg by mouth 2 (two) times daily. 04/15/22   [provider]  Vitamin D, Ergocalciferol, (DRISDOL) 50000 UNITS CAPS capsule Take 50,000 Units by mouth every 7 (seven) days. On Wednesday.    [provider]      Allergies     Patient has no known allergies.    Review of Systems   Review of Systems  Genitourinary:  Positive for hematuria.    Physical Exam Updated Vital Signs BP (!) 144/61 (BP Location: Right Arm)   Pulse 76   Temp 97.9 F (36.6 C) (Oral)   Resp 18   Ht 5\' 7"  (1.702 m)   Wt 97.5 kg   SpO2 94%   BMI 33.67 kg/m  Physical Exam Vitals and nursing note reviewed.  Constitutional:      General: She is not in acute distress.    Appearance: She is well-developed. She is not diaphoretic.  HENT:     Head: Normocephalic and atraumatic.  Eyes:     Pupils: Pupils are equal, round, and reactive to light.  Cardiovascular:     Rate and Rhythm: Normal rate and regular rhythm.     Heart sounds: No murmur heard.    No friction rub. No gallop.  Pulmonary:     Effort: Pulmonary effort is normal.     Breath sounds: No wheezing or rales.  Abdominal:     General: There is no distension.     Palpations: Abdomen is soft.     Tenderness: There is no abdominal tenderness.  Musculoskeletal:        General: No tenderness.     Cervical  back: Normal range of motion and neck supple.  Skin:    General: Skin is warm and dry.  Neurological:     Mental Status: She is alert and oriented to person, place, and time.  Psychiatric:        Behavior: Behavior normal.     ED Results / Procedures / Treatments   Labs (all labs ordered are listed, but only abnormal results are displayed) Labs Reviewed  URINALYSIS, ROUTINE W REFLEX MICROSCOPIC - Abnormal; Notable for the following components:      Result Value   Hgb urine dipstick LARGE (*)    Protein, ur 30 (*)    Leukocytes,Ua TRACE (*)    All other components within normal limits  URINALYSIS, MICROSCOPIC (REFLEX) - Abnormal; Notable for the following components:   Bacteria, UA FEW (*)    All other components within normal limits    EKG None  Radiology No results found.  Procedures Procedures    Medications Ordered in ED Medications - No data  to display  ED Course/ Medical Decision Making/ A&P                             Medical Decision Making Amount and/or Complexity of Data Reviewed Labs: ordered.   82 yo F with a chief complaint of hematuria.  Going on for about 6 days now.  Has no symptoms otherwise.  No symptoms consistent with urinary obstruction.  No symptoms consistent with kidney stone.  Will have her follow-up with the urologist in the office.  UA without obvious infection will hold off on antibiotic therapy.  12:14 PM:  I have discussed the diagnosis/risks/treatment options with the patient and family.  Evaluation and diagnostic testing in the emergency department does not suggest an emergent condition requiring admission or immediate intervention beyond what has been performed at this time.  They will follow up with Urology. We also discussed returning to the ED immediately if new or worsening sx occur. We discussed the sx which are most concerning (e.g., sudden worsening pain, fever, inability to tolerate by mouth) that necessitate immediate return. Medications administered to the patient during their visit and any new prescriptions provided to the patient are listed below.  Medications given during this visit Medications - No data to display   The patient appears reasonably screen and/or stabilized for discharge and I doubt any other medical condition or other [facility] requiring further screening, evaluation, or treatment in the ED at this time prior to discharge.          Final Clinical Impression(s) / ED Diagnoses Final diagnoses:  Gross hematuria    Rx / DC Orders ED Discharge Orders     None         Melene Plan, DO 02/03/23 1215

## 2023-02-04 DIAGNOSIS — K219 Gastro-esophageal reflux disease without esophagitis: Secondary | ICD-10-CM | POA: Diagnosis not present

## 2023-02-04 DIAGNOSIS — Z9181 History of falling: Secondary | ICD-10-CM | POA: Diagnosis not present

## 2023-02-04 DIAGNOSIS — N183 Chronic kidney disease, stage 3 unspecified: Secondary | ICD-10-CM | POA: Diagnosis not present

## 2023-02-04 DIAGNOSIS — Z7901 Long term (current) use of anticoagulants: Secondary | ICD-10-CM | POA: Diagnosis not present

## 2023-02-04 DIAGNOSIS — I129 Hypertensive chronic kidney disease with stage 1 through stage 4 chronic kidney disease, or unspecified chronic kidney disease: Secondary | ICD-10-CM | POA: Diagnosis not present

## 2023-02-04 DIAGNOSIS — Z981 Arthrodesis status: Secondary | ICD-10-CM | POA: Diagnosis not present

## 2023-02-04 DIAGNOSIS — E785 Hyperlipidemia, unspecified: Secondary | ICD-10-CM | POA: Diagnosis not present

## 2023-02-04 DIAGNOSIS — M48062 Spinal stenosis, lumbar region with neurogenic claudication: Secondary | ICD-10-CM | POA: Diagnosis not present

## 2023-02-04 DIAGNOSIS — I480 Paroxysmal atrial fibrillation: Secondary | ICD-10-CM | POA: Diagnosis not present

## 2023-02-04 DIAGNOSIS — Z7984 Long term (current) use of oral hypoglycemic drugs: Secondary | ICD-10-CM | POA: Diagnosis not present

## 2023-02-04 DIAGNOSIS — Z4789 Encounter for other orthopedic aftercare: Secondary | ICD-10-CM | POA: Diagnosis not present

## 2023-02-04 DIAGNOSIS — E1122 Type 2 diabetes mellitus with diabetic chronic kidney disease: Secondary | ICD-10-CM | POA: Diagnosis not present

## 2023-02-25 DIAGNOSIS — E1122 Type 2 diabetes mellitus with diabetic chronic kidney disease: Secondary | ICD-10-CM | POA: Diagnosis not present

## 2023-02-25 DIAGNOSIS — N183 Chronic kidney disease, stage 3 unspecified: Secondary | ICD-10-CM | POA: Diagnosis not present

## 2023-02-25 DIAGNOSIS — I480 Paroxysmal atrial fibrillation: Secondary | ICD-10-CM | POA: Diagnosis not present

## 2023-02-25 DIAGNOSIS — Z4789 Encounter for other orthopedic aftercare: Secondary | ICD-10-CM | POA: Diagnosis not present

## 2023-02-25 DIAGNOSIS — E785 Hyperlipidemia, unspecified: Secondary | ICD-10-CM | POA: Diagnosis not present

## 2023-02-25 DIAGNOSIS — K219 Gastro-esophageal reflux disease without esophagitis: Secondary | ICD-10-CM | POA: Diagnosis not present

## 2023-02-25 DIAGNOSIS — I129 Hypertensive chronic kidney disease with stage 1 through stage 4 chronic kidney disease, or unspecified chronic kidney disease: Secondary | ICD-10-CM | POA: Diagnosis not present

## 2023-02-25 DIAGNOSIS — Z9181 History of falling: Secondary | ICD-10-CM | POA: Diagnosis not present

## 2023-02-25 DIAGNOSIS — M48062 Spinal stenosis, lumbar region with neurogenic claudication: Secondary | ICD-10-CM | POA: Diagnosis not present

## 2023-02-25 DIAGNOSIS — Z7984 Long term (current) use of oral hypoglycemic drugs: Secondary | ICD-10-CM | POA: Diagnosis not present

## 2023-02-25 DIAGNOSIS — Z7901 Long term (current) use of anticoagulants: Secondary | ICD-10-CM | POA: Diagnosis not present

## 2023-02-25 DIAGNOSIS — Z981 Arthrodesis status: Secondary | ICD-10-CM | POA: Diagnosis not present

## 2023-03-11 DIAGNOSIS — Z9181 History of falling: Secondary | ICD-10-CM | POA: Diagnosis not present

## 2023-03-11 DIAGNOSIS — M48062 Spinal stenosis, lumbar region with neurogenic claudication: Secondary | ICD-10-CM | POA: Diagnosis not present

## 2023-03-11 DIAGNOSIS — Z4789 Encounter for other orthopedic aftercare: Secondary | ICD-10-CM | POA: Diagnosis not present

## 2023-03-11 DIAGNOSIS — Z981 Arthrodesis status: Secondary | ICD-10-CM | POA: Diagnosis not present

## 2023-03-17 DIAGNOSIS — M48062 Spinal stenosis, lumbar region with neurogenic claudication: Secondary | ICD-10-CM | POA: Diagnosis not present

## 2023-03-17 DIAGNOSIS — Z6837 Body mass index (BMI) 37.0-37.9, adult: Secondary | ICD-10-CM | POA: Diagnosis not present

## 2023-03-17 DIAGNOSIS — M5106 Intervertebral disc disorders with myelopathy, lumbar region: Secondary | ICD-10-CM | POA: Diagnosis not present

## 2023-03-25 DIAGNOSIS — Z9889 Other specified postprocedural states: Secondary | ICD-10-CM | POA: Diagnosis not present

## 2023-03-25 DIAGNOSIS — Z8679 Personal history of other diseases of the circulatory system: Secondary | ICD-10-CM | POA: Diagnosis not present

## 2023-03-25 DIAGNOSIS — I493 Ventricular premature depolarization: Secondary | ICD-10-CM | POA: Diagnosis not present

## 2023-03-27 DIAGNOSIS — R9431 Abnormal electrocardiogram [ECG] [EKG]: Secondary | ICD-10-CM | POA: Diagnosis not present

## 2023-05-29 DIAGNOSIS — Z0001 Encounter for general adult medical examination with abnormal findings: Secondary | ICD-10-CM | POA: Diagnosis not present

## 2023-05-29 DIAGNOSIS — E538 Deficiency of other specified B group vitamins: Secondary | ICD-10-CM | POA: Diagnosis not present

## 2023-05-29 DIAGNOSIS — E559 Vitamin D deficiency, unspecified: Secondary | ICD-10-CM | POA: Diagnosis not present

## 2023-05-29 DIAGNOSIS — E782 Mixed hyperlipidemia: Secondary | ICD-10-CM | POA: Diagnosis not present

## 2023-05-29 DIAGNOSIS — F419 Anxiety disorder, unspecified: Secondary | ICD-10-CM | POA: Diagnosis not present

## 2023-05-29 DIAGNOSIS — E1165 Type 2 diabetes mellitus with hyperglycemia: Secondary | ICD-10-CM | POA: Diagnosis not present

## 2023-05-29 DIAGNOSIS — E7211 Homocystinuria: Secondary | ICD-10-CM | POA: Diagnosis not present

## 2023-05-29 DIAGNOSIS — I10 Essential (primary) hypertension: Secondary | ICD-10-CM | POA: Diagnosis not present

## 2023-07-31 DIAGNOSIS — I10 Essential (primary) hypertension: Secondary | ICD-10-CM | POA: Diagnosis not present

## 2023-07-31 DIAGNOSIS — E7211 Homocystinuria: Secondary | ICD-10-CM | POA: Diagnosis not present

## 2023-07-31 DIAGNOSIS — F419 Anxiety disorder, unspecified: Secondary | ICD-10-CM | POA: Diagnosis not present

## 2023-07-31 DIAGNOSIS — E1165 Type 2 diabetes mellitus with hyperglycemia: Secondary | ICD-10-CM | POA: Diagnosis not present

## 2023-07-31 DIAGNOSIS — E782 Mixed hyperlipidemia: Secondary | ICD-10-CM | POA: Diagnosis not present

## 2023-07-31 DIAGNOSIS — E538 Deficiency of other specified B group vitamins: Secondary | ICD-10-CM | POA: Diagnosis not present

## 2023-07-31 DIAGNOSIS — E559 Vitamin D deficiency, unspecified: Secondary | ICD-10-CM | POA: Diagnosis not present

## 2023-09-19 DIAGNOSIS — R69 Illness, unspecified: Secondary | ICD-10-CM | POA: Diagnosis not present

## 2023-11-10 DIAGNOSIS — R079 Chest pain, unspecified: Secondary | ICD-10-CM | POA: Diagnosis not present

## 2023-11-10 DIAGNOSIS — S29011A Strain of muscle and tendon of front wall of thorax, initial encounter: Secondary | ICD-10-CM | POA: Diagnosis not present

## 2023-11-10 DIAGNOSIS — I10 Essential (primary) hypertension: Secondary | ICD-10-CM | POA: Diagnosis not present

## 2023-11-24 DIAGNOSIS — E1122 Type 2 diabetes mellitus with diabetic chronic kidney disease: Secondary | ICD-10-CM | POA: Diagnosis not present

## 2023-11-24 DIAGNOSIS — I471 Supraventricular tachycardia, unspecified: Secondary | ICD-10-CM | POA: Diagnosis not present

## 2023-11-24 DIAGNOSIS — I700 Atherosclerosis of aorta: Secondary | ICD-10-CM | POA: Diagnosis not present

## 2023-11-24 DIAGNOSIS — D6869 Other thrombophilia: Secondary | ICD-10-CM | POA: Diagnosis not present

## 2023-11-24 DIAGNOSIS — E1151 Type 2 diabetes mellitus with diabetic peripheral angiopathy without gangrene: Secondary | ICD-10-CM | POA: Diagnosis not present

## 2023-11-24 DIAGNOSIS — I719 Aortic aneurysm of unspecified site, without rupture: Secondary | ICD-10-CM | POA: Diagnosis not present

## 2023-11-24 DIAGNOSIS — N1831 Chronic kidney disease, stage 3a: Secondary | ICD-10-CM | POA: Diagnosis not present

## 2023-11-24 DIAGNOSIS — I4892 Unspecified atrial flutter: Secondary | ICD-10-CM | POA: Diagnosis not present

## 2023-11-24 DIAGNOSIS — F325 Major depressive disorder, single episode, in full remission: Secondary | ICD-10-CM | POA: Diagnosis not present

## 2023-11-24 DIAGNOSIS — I429 Cardiomyopathy, unspecified: Secondary | ICD-10-CM | POA: Diagnosis not present

## 2023-11-24 DIAGNOSIS — I4891 Unspecified atrial fibrillation: Secondary | ICD-10-CM | POA: Diagnosis not present

## 2023-12-16 DIAGNOSIS — Z9889 Other specified postprocedural states: Secondary | ICD-10-CM | POA: Diagnosis not present

## 2023-12-16 DIAGNOSIS — I493 Ventricular premature depolarization: Secondary | ICD-10-CM | POA: Diagnosis not present

## 2023-12-16 DIAGNOSIS — I480 Paroxysmal atrial fibrillation: Secondary | ICD-10-CM | POA: Diagnosis not present

## 2023-12-16 DIAGNOSIS — Z8679 Personal history of other diseases of the circulatory system: Secondary | ICD-10-CM | POA: Diagnosis not present

## 2023-12-17 DIAGNOSIS — I480 Paroxysmal atrial fibrillation: Secondary | ICD-10-CM | POA: Diagnosis not present

## 2024-03-01 DIAGNOSIS — E559 Vitamin D deficiency, unspecified: Secondary | ICD-10-CM | POA: Diagnosis not present

## 2024-03-01 DIAGNOSIS — I129 Hypertensive chronic kidney disease with stage 1 through stage 4 chronic kidney disease, or unspecified chronic kidney disease: Secondary | ICD-10-CM | POA: Diagnosis not present

## 2024-03-01 DIAGNOSIS — F419 Anxiety disorder, unspecified: Secondary | ICD-10-CM | POA: Diagnosis not present

## 2024-03-01 DIAGNOSIS — E782 Mixed hyperlipidemia: Secondary | ICD-10-CM | POA: Diagnosis not present

## 2024-03-01 DIAGNOSIS — E7211 Homocystinuria: Secondary | ICD-10-CM | POA: Diagnosis not present

## 2024-03-01 DIAGNOSIS — N1831 Chronic kidney disease, stage 3a: Secondary | ICD-10-CM | POA: Diagnosis not present

## 2024-03-01 DIAGNOSIS — Z0001 Encounter for general adult medical examination with abnormal findings: Secondary | ICD-10-CM | POA: Diagnosis not present

## 2024-03-01 DIAGNOSIS — E538 Deficiency of other specified B group vitamins: Secondary | ICD-10-CM | POA: Diagnosis not present

## 2024-03-01 DIAGNOSIS — E1165 Type 2 diabetes mellitus with hyperglycemia: Secondary | ICD-10-CM | POA: Diagnosis not present

## 2024-03-15 DIAGNOSIS — N1831 Chronic kidney disease, stage 3a: Secondary | ICD-10-CM | POA: Diagnosis not present

## 2024-03-15 DIAGNOSIS — E1165 Type 2 diabetes mellitus with hyperglycemia: Secondary | ICD-10-CM | POA: Diagnosis not present

## 2024-03-15 DIAGNOSIS — E7211 Homocystinuria: Secondary | ICD-10-CM | POA: Diagnosis not present

## 2024-03-15 DIAGNOSIS — E559 Vitamin D deficiency, unspecified: Secondary | ICD-10-CM | POA: Diagnosis not present

## 2024-03-15 DIAGNOSIS — F419 Anxiety disorder, unspecified: Secondary | ICD-10-CM | POA: Diagnosis not present

## 2024-03-15 DIAGNOSIS — E782 Mixed hyperlipidemia: Secondary | ICD-10-CM | POA: Diagnosis not present

## 2024-03-15 DIAGNOSIS — I129 Hypertensive chronic kidney disease with stage 1 through stage 4 chronic kidney disease, or unspecified chronic kidney disease: Secondary | ICD-10-CM | POA: Diagnosis not present

## 2024-03-15 DIAGNOSIS — E538 Deficiency of other specified B group vitamins: Secondary | ICD-10-CM | POA: Diagnosis not present

## 2024-05-31 DIAGNOSIS — Z0001 Encounter for general adult medical examination with abnormal findings: Secondary | ICD-10-CM | POA: Diagnosis not present

## 2024-05-31 DIAGNOSIS — E1122 Type 2 diabetes mellitus with diabetic chronic kidney disease: Secondary | ICD-10-CM | POA: Diagnosis not present

## 2024-05-31 DIAGNOSIS — I1310 Hypertensive heart and chronic kidney disease without heart failure, with stage 1 through stage 4 chronic kidney disease, or unspecified chronic kidney disease: Secondary | ICD-10-CM | POA: Diagnosis not present

## 2024-05-31 DIAGNOSIS — E1165 Type 2 diabetes mellitus with hyperglycemia: Secondary | ICD-10-CM | POA: Diagnosis not present

## 2024-05-31 DIAGNOSIS — I4891 Unspecified atrial fibrillation: Secondary | ICD-10-CM | POA: Diagnosis not present

## 2024-05-31 DIAGNOSIS — N1832 Chronic kidney disease, stage 3b: Secondary | ICD-10-CM | POA: Diagnosis not present

## 2024-05-31 DIAGNOSIS — E538 Deficiency of other specified B group vitamins: Secondary | ICD-10-CM | POA: Diagnosis not present

## 2024-05-31 DIAGNOSIS — I2510 Atherosclerotic heart disease of native coronary artery without angina pectoris: Secondary | ICD-10-CM | POA: Diagnosis not present

## 2024-07-29 DIAGNOSIS — E1165 Type 2 diabetes mellitus with hyperglycemia: Secondary | ICD-10-CM | POA: Diagnosis not present

## 2024-07-29 DIAGNOSIS — I2510 Atherosclerotic heart disease of native coronary artery without angina pectoris: Secondary | ICD-10-CM | POA: Diagnosis not present

## 2024-07-29 DIAGNOSIS — N1832 Chronic kidney disease, stage 3b: Secondary | ICD-10-CM | POA: Diagnosis not present

## 2024-07-29 DIAGNOSIS — E538 Deficiency of other specified B group vitamins: Secondary | ICD-10-CM | POA: Diagnosis not present

## 2024-07-29 DIAGNOSIS — I1310 Hypertensive heart and chronic kidney disease without heart failure, with stage 1 through stage 4 chronic kidney disease, or unspecified chronic kidney disease: Secondary | ICD-10-CM | POA: Diagnosis not present

## 2024-07-29 DIAGNOSIS — I4891 Unspecified atrial fibrillation: Secondary | ICD-10-CM | POA: Diagnosis not present

## 2024-07-29 DIAGNOSIS — E1122 Type 2 diabetes mellitus with diabetic chronic kidney disease: Secondary | ICD-10-CM | POA: Diagnosis not present

## 2024-08-05 DIAGNOSIS — Z0389 Encounter for observation for other suspected diseases and conditions ruled out: Secondary | ICD-10-CM | POA: Diagnosis not present

## 2024-08-05 DIAGNOSIS — D485 Neoplasm of uncertain behavior of skin: Secondary | ICD-10-CM | POA: Diagnosis not present

## 2024-08-05 DIAGNOSIS — L98499 Non-pressure chronic ulcer of skin of other sites with unspecified severity: Secondary | ICD-10-CM | POA: Diagnosis not present

## 2024-09-09 DIAGNOSIS — E782 Mixed hyperlipidemia: Secondary | ICD-10-CM | POA: Diagnosis not present

## 2024-09-09 DIAGNOSIS — E1122 Type 2 diabetes mellitus with diabetic chronic kidney disease: Secondary | ICD-10-CM | POA: Diagnosis not present

## 2024-09-09 DIAGNOSIS — E538 Deficiency of other specified B group vitamins: Secondary | ICD-10-CM | POA: Diagnosis not present

## 2024-09-09 DIAGNOSIS — N1832 Chronic kidney disease, stage 3b: Secondary | ICD-10-CM | POA: Diagnosis not present

## 2024-09-09 DIAGNOSIS — I2510 Atherosclerotic heart disease of native coronary artery without angina pectoris: Secondary | ICD-10-CM | POA: Diagnosis not present

## 2024-09-09 DIAGNOSIS — I4891 Unspecified atrial fibrillation: Secondary | ICD-10-CM | POA: Diagnosis not present

## 2024-09-09 DIAGNOSIS — I1310 Hypertensive heart and chronic kidney disease without heart failure, with stage 1 through stage 4 chronic kidney disease, or unspecified chronic kidney disease: Secondary | ICD-10-CM | POA: Diagnosis not present

## 2024-09-09 DIAGNOSIS — E1165 Type 2 diabetes mellitus with hyperglycemia: Secondary | ICD-10-CM | POA: Diagnosis not present

## 2024-09-13 DIAGNOSIS — D485 Neoplasm of uncertain behavior of skin: Secondary | ICD-10-CM | POA: Diagnosis not present
